# Patient Record
Sex: Male | Born: 1970 | Race: Black or African American | Hispanic: No | Smoking: Current some day smoker
Health system: Southern US, Community
[De-identification: ages and names within clinical notes are randomized; demographics above are authoritative.]

## PROBLEM LIST (undated history)

## (undated) DIAGNOSIS — F419 Anxiety disorder, unspecified: Secondary | ICD-10-CM

## (undated) DIAGNOSIS — M1990 Unspecified osteoarthritis, unspecified site: Secondary | ICD-10-CM

## (undated) DIAGNOSIS — F909 Attention-deficit hyperactivity disorder, unspecified type: Secondary | ICD-10-CM

## (undated) DIAGNOSIS — R7303 Prediabetes: Secondary | ICD-10-CM

## (undated) DIAGNOSIS — I10 Essential (primary) hypertension: Secondary | ICD-10-CM

## (undated) DIAGNOSIS — I519 Heart disease, unspecified: Secondary | ICD-10-CM

## (undated) DIAGNOSIS — E079 Disorder of thyroid, unspecified: Secondary | ICD-10-CM

## (undated) DIAGNOSIS — E785 Hyperlipidemia, unspecified: Secondary | ICD-10-CM

## (undated) DIAGNOSIS — Z973 Presence of spectacles and contact lenses: Secondary | ICD-10-CM

## (undated) DIAGNOSIS — E0590 Thyrotoxicosis, unspecified without thyrotoxic crisis or storm: Secondary | ICD-10-CM

## (undated) DIAGNOSIS — M24662 Ankylosis, left knee: Secondary | ICD-10-CM

## (undated) DIAGNOSIS — E0500 Thyrotoxicosis with diffuse goiter without thyrotoxic crisis or storm: Secondary | ICD-10-CM

## (undated) HISTORY — PX: UMBILICAL HERNIA REPAIR: SHX196

## (undated) HISTORY — PX: ANTERIOR CRUCIATE LIGAMENT REPAIR: SHX115

## (undated) HISTORY — PX: KNEE ARTHROSCOPY W/ MEDIAL COLLATERAL LIGAMENT (MCL) REPAIR: SHX1876

## (undated) HISTORY — PX: LIPOMA EXCISION: SHX5283

## (undated) HISTORY — DX: Anxiety disorder, unspecified: F41.9

## (undated) HISTORY — DX: Disorder of thyroid, unspecified: E07.9

## (undated) HISTORY — PX: HERNIA REPAIR: SHX51

## (undated) HISTORY — DX: Thyrotoxicosis with diffuse goiter without thyrotoxic crisis or storm: E05.00

## (undated) HISTORY — PX: JOINT REPLACEMENT: SHX530

## (undated) HISTORY — PX: KNEE ARTHROSCOPY W/ MENISCAL REPAIR: SHX1877

---

## 2006-05-17 DIAGNOSIS — Z8639 Personal history of other endocrine, nutritional and metabolic disease: Secondary | ICD-10-CM

## 2006-05-17 HISTORY — DX: Personal history of other endocrine, nutritional and metabolic disease: Z86.39

## 2014-08-13 DIAGNOSIS — K219 Gastro-esophageal reflux disease without esophagitis: Secondary | ICD-10-CM | POA: Insufficient documentation

## 2014-08-13 DIAGNOSIS — Z720 Tobacco use: Secondary | ICD-10-CM | POA: Insufficient documentation

## 2014-08-13 DIAGNOSIS — F17210 Nicotine dependence, cigarettes, uncomplicated: Secondary | ICD-10-CM | POA: Insufficient documentation

## 2014-08-13 DIAGNOSIS — M5126 Other intervertebral disc displacement, lumbar region: Secondary | ICD-10-CM | POA: Insufficient documentation

## 2016-03-26 DIAGNOSIS — E785 Hyperlipidemia, unspecified: Secondary | ICD-10-CM | POA: Insufficient documentation

## 2016-03-26 DIAGNOSIS — Z8249 Family history of ischemic heart disease and other diseases of the circulatory system: Secondary | ICD-10-CM | POA: Insufficient documentation

## 2018-07-24 ENCOUNTER — Ambulatory Visit (INDEPENDENT_AMBULATORY_CARE_PROVIDER_SITE_OTHER): Payer: Managed Care, Other (non HMO) | Admitting: Nurse Practitioner

## 2018-07-24 ENCOUNTER — Ambulatory Visit: Payer: Self-pay | Admitting: Nurse Practitioner

## 2018-07-24 ENCOUNTER — Encounter: Payer: Self-pay | Admitting: Nurse Practitioner

## 2018-07-24 VITALS — BP 128/78 | HR 63 | Temp 98.6°F | Ht 74.0 in | Wt 228.4 lb

## 2018-07-24 DIAGNOSIS — I10 Essential (primary) hypertension: Secondary | ICD-10-CM | POA: Diagnosis not present

## 2018-07-24 DIAGNOSIS — Z8639 Personal history of other endocrine, nutritional and metabolic disease: Secondary | ICD-10-CM | POA: Insufficient documentation

## 2018-07-24 DIAGNOSIS — F988 Other specified behavioral and emotional disorders with onset usually occurring in childhood and adolescence: Secondary | ICD-10-CM | POA: Insufficient documentation

## 2018-07-24 DIAGNOSIS — K5900 Constipation, unspecified: Secondary | ICD-10-CM

## 2018-07-24 DIAGNOSIS — R1011 Right upper quadrant pain: Secondary | ICD-10-CM | POA: Insufficient documentation

## 2018-07-24 LAB — POCT URINALYSIS DIPSTICK
Bilirubin, UA: NEGATIVE
Glucose, UA: NEGATIVE
Ketones, UA: NEGATIVE
Leukocytes, UA: NEGATIVE
Nitrite, UA: NEGATIVE
Protein, UA: NEGATIVE
Spec Grav, UA: 1.02 (ref 1.010–1.025)
Urobilinogen, UA: 0.2 E.U./dL
pH, UA: 6 (ref 5.0–8.0)

## 2018-07-24 NOTE — Progress Notes (Addendum)
Subjective:     Patient ID: Steven Gibson , male    DOB: Jan 28, 1971 , 48 y.o.   MRN: [id number]   Chief Complaint  Patient presents with  . New Patient (Initial Visit)    having stomach pain  x3 day , stomach spasms, no nausea, no vomitting, some diarrreha for the first day     HPI   Here to establish care - his girlfriend referred Shirlean Schlein).  He had been going to another PCP has to get.  Relocated from [place] went to [place].  Due for physical.  Umbilical hernia repair in 2012.  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  6 daughters and one son and 3 grandsons.    Intentional weight loss of 22 pounds.  Eats twice a day.  No correlation to food.  Drinks alcohol on the weekends, 2 drinks liquor.    History of graves disease.  Had a deep cleaning from dentist.    Swelling to left lateral neck.    His endocrinologist [facility] took him off the medication over a year ago.  He thinks Methimazole.  Had an ultrasound with iodine radiation (2000) to the thyroid. Dad has has hyperthyroid and his daughters has hypothyroid.    He has been taking Adderall for ADHD (diagnosed as an adult) - Dr. Donell Sievert [facility]) with     Abdominal Pain  This is a new problem. The current episode started in the past 7 days. The onset quality is sudden. The problem occurs daily. The most recent episode lasted 0 hours. The problem has been unchanged. The pain is located in the RUQ. The pain is at a severity of 4/10. The pain is moderate. The quality of the pain is aching and sharp. The abdominal pain does not radiate. Associated symptoms include constipation. Pertinent negatives include no diarrhea, dysuria, fever, headaches, nausea or vomiting. Nothing aggravates the pain. The pain is relieved by nothing. He has tried antacids for the symptoms. The treatment provided no relief. His past medical history is significant for abdominal surgery.  Constipation  This is a recurrent problem. The  current episode started 1 to 4 weeks ago. The problem has been gradually improving since onset. The patient is not on a high fiber diet. He does not exercise regularly. There has not been adequate water intake. Associated symptoms include abdominal pain. Pertinent negatives include no diarrhea, fever, nausea or vomiting. He has tried laxatives for the symptoms. His past medical history is significant for abdominal surgery, endocrine disease and radiation treatment (iodine radiation for hyperthyroid).  Hypertension  This is a chronic (Diagnosed over 20 years ago) problem. The current episode started more than 1 year ago. The problem is unchanged. Pertinent negatives include no anxiety, chest pain, headaches or palpitations. There are no associated agents to hypertension. Past treatments include ACE inhibitors, diuretics and calcium channel blockers. There are no compliance problems.  There is no history of angina. There is no history of chronic renal disease.     Past Medical History:  Diagnosis Date  . Graves disease      Family History  Problem Relation Age of Onset  . Hypertension Mother   . Hypertension Father   . Hypertension Brother   . Cancer Paternal Grandfather   . Hypertension Brother      Current Outpatient Medications:  .  amLODipine (NORVASC) 10 MG tablet, Take by mouth., Disp: , Rfl:  .  amphetamine-dextroamphetamine (ADDERALL) 10 MG tablet, , Disp: ,  Rfl:  .  aspirin EC 81 MG tablet, Take by mouth., Disp: , Rfl:  .  lisinopril-hydrochlorothiazide (PRINZIDE,ZESTORETIC) 20-25 MG tablet, , Disp: , Rfl:  .  pravastatin (PRAVACHOL) 20 MG tablet, Take by mouth., Disp: , Rfl:    Not on File   Review of Systems  Constitutional: Negative for fatigue and fever.  HENT: Negative for sore throat.   Respiratory: Negative for cough and wheezing.   Cardiovascular: Negative for chest pain, palpitations and leg swelling.  Gastrointestinal: Positive for abdominal pain and constipation.  Negative for abdominal distention, blood in stool, diarrhea, nausea and vomiting.  Endocrine: Negative for polydipsia, polyphagia and polyuria.  Genitourinary: Negative for dysuria.  Skin: Negative.  Negative for color change, pallor and rash.  Neurological: Negative for dizziness, weakness and headaches.  Hematological: Positive for adenopathy.     Today's Vitals   07/24/18 1023  BP: 128/78  Pulse: 63  Temp: 98.6 F (37 C)  TempSrc: Oral  SpO2: 95%  Weight: 228 lb 6.4 oz (103.6 kg)  Height: 6\' 2"  (1.88 m)   Body mass index is 29.32 kg/m.   Objective:  Physical Exam Constitutional:      Appearance: Normal appearance.  Cardiovascular:     Rate and Rhythm: Normal rate and regular rhythm.     Pulses: Normal pulses.     Heart sounds: Normal heart sounds. No murmur.  Pulmonary:     Effort: Pulmonary effort is normal. No respiratory distress.     Breath sounds: Normal breath sounds.  Abdominal:     General: Abdomen is flat. Bowel sounds are normal. There is no distension.     Palpations: Abdomen is soft. There is no mass.     Tenderness: There is abdominal tenderness (right upper quadrant). There is no right CVA tenderness or left CVA tenderness. Positive signs include Murphy's sign. Negative signs include McBurney's sign.     Hernia: No hernia is present.  Neurological:     Mental Status: He is alert.         Assessment And Plan:     1. Right upper quadrant abdominal pain  Positive Murphy's  Will check amylase and lipase - CBC no Diff - CMP14 + Anion Gap - Amylase - Lipase - POCT Urinalysis Dipstick (81002)  2. Essential hypertension . B/P is good control  . CMP ordered to check renal function.  . The importance of regular exercise and dietary modification was stressed to the patient.  . Stressed importance of losing ten percent of her body weight to help with B/P control.  . The weight loss would help with decreasing cardiac and cancer risk as well.  - CBC no  Diff - CMP14 + Anion Gap  3. H/O Graves' disease  No current medications he reports he is stable. - TSH - T3 - T4, Free  4. Attention deficit disorder, unspecified hyperactivity presence  Being managed by behavioral health   5. Constipation, unspecified constipation type  Reports previous history but feels his previous abdominal pain was related to this.  Took a stool softner and laxative with relief.  Encouraged to stay well hydrated and increase fiber intake    [name], FNP

## 2018-07-24 NOTE — Patient Instructions (Signed)

## 2018-07-25 LAB — CMP14 + ANION GAP
ALT: 23 IU/L (ref 0–44)
AST: 27 IU/L (ref 0–40)
Albumin/Globulin Ratio: 1.6 (ref 1.2–2.2)
Albumin: 4.4 g/dL (ref 4.0–5.0)
Alkaline Phosphatase: 125 IU/L — ABNORMAL HIGH (ref 39–117)
Anion Gap: 19 mmol/L — ABNORMAL HIGH (ref 10.0–18.0)
BUN/Creatinine Ratio: 10 (ref 9–20)
BUN: 10 mg/dL (ref 6–24)
Bilirubin Total: 0.3 mg/dL (ref 0.0–1.2)
CO2: 27 mmol/L (ref 20–29)
Calcium: 9.8 mg/dL (ref 8.7–10.2)
Chloride: 97 mmol/L (ref 96–106)
Creatinine, Ser: 1.01 mg/dL (ref 0.76–1.27)
GFR calc Af Amer: 101 mL/min/{1.73_m2} (ref >59)
GFR calc non Af Amer: 88 mL/min/{1.73_m2} (ref >59)
Globulin, Total: 2.7 g/dL (ref 1.5–4.5)
Glucose: 86 mg/dL (ref 65–99)
Potassium: 4.5 mmol/L (ref 3.5–5.2)
Sodium: 143 mmol/L (ref 134–144)
Total Protein: 7.1 g/dL (ref 6.0–8.5)

## 2018-07-25 LAB — CBC
Hematocrit: 47.1 % (ref 37.5–51.0)
Hemoglobin: 15.5 g/dL (ref 13.0–17.7)
MCH: 27.1 pg (ref 26.6–33.0)
MCHC: 32.9 g/dL (ref 31.5–35.7)
MCV: 82 fL (ref 79–97)
Platelets: 367 10*3/uL (ref 150–450)
RBC: 5.73 x10E6/uL (ref 4.14–5.80)
RDW: 13.7 % (ref 11.6–15.4)
WBC: 6.5 10*3/uL (ref 3.4–10.8)

## 2018-07-25 LAB — T4, FREE: Free T4: 1.31 ng/dL (ref 0.82–1.77)

## 2018-07-25 LAB — LIPASE: Lipase: 19 U/L (ref 13–78)

## 2018-07-25 LAB — T3: T3, Total: 117 ng/dL (ref 71–180)

## 2018-07-25 LAB — TSH: TSH: 1.72 u[IU]/mL (ref 0.450–4.500)

## 2018-07-25 LAB — AMYLASE: Amylase: 96 U/L (ref 31–110)

## 2018-08-02 ENCOUNTER — Other Ambulatory Visit: Payer: Self-pay

## 2018-08-02 ENCOUNTER — Ambulatory Visit (INDEPENDENT_AMBULATORY_CARE_PROVIDER_SITE_OTHER): Payer: Managed Care, Other (non HMO) | Admitting: Nurse Practitioner

## 2018-08-02 ENCOUNTER — Encounter: Payer: Self-pay | Admitting: Nurse Practitioner

## 2018-08-02 VITALS — BP 112/84 | HR 83 | Temp 97.5°F | Ht 71.75 in | Wt 227.8 lb

## 2018-08-02 DIAGNOSIS — I10 Essential (primary) hypertension: Secondary | ICD-10-CM

## 2018-08-02 DIAGNOSIS — Z125 Encounter for screening for malignant neoplasm of prostate: Secondary | ICD-10-CM | POA: Diagnosis not present

## 2018-08-02 DIAGNOSIS — Z0000 Encounter for general adult medical examination without abnormal findings: Secondary | ICD-10-CM

## 2018-08-02 DIAGNOSIS — E782 Mixed hyperlipidemia: Secondary | ICD-10-CM | POA: Insufficient documentation

## 2018-08-02 DIAGNOSIS — R202 Paresthesia of skin: Secondary | ICD-10-CM | POA: Insufficient documentation

## 2018-08-02 DIAGNOSIS — Z139 Encounter for screening, unspecified: Secondary | ICD-10-CM

## 2018-08-02 DIAGNOSIS — R200 Anesthesia of skin: Secondary | ICD-10-CM

## 2018-08-02 LAB — POCT URINALYSIS DIPSTICK
Bilirubin, UA: NEGATIVE
Glucose, UA: NEGATIVE
Ketones, UA: NEGATIVE
Leukocytes, UA: NEGATIVE
Nitrite, UA: NEGATIVE
Protein, UA: NEGATIVE
Spec Grav, UA: 1.025 (ref 1.010–1.025)
Urobilinogen, UA: 0.2 E.U./dL
pH, UA: 6 (ref 5.0–8.0)

## 2018-08-02 LAB — POCT UA - MICROALBUMIN
Albumin/Creatinine Ratio, Urine, POC: <30
Creatinine, POC: 300 mg/dL
Microalbumin Ur, POC: 30 mg/L

## 2018-08-02 MED ORDER — AMLODIPINE BESYLATE 10 MG PO TABS: 10.0000 mg | ORAL_TABLET | Freq: Every day | ORAL | 1 refills | Status: DC

## 2018-08-02 MED ORDER — PRAVASTATIN SODIUM 20 MG PO TABS: 20.0000 mg | ORAL_TABLET | Freq: Every day | ORAL | 1 refills | Status: DC

## 2018-08-02 MED ORDER — LISINOPRIL-HYDROCHLOROTHIAZIDE 20-25 MG PO TABS: 1.0000 | ORAL_TABLET | Freq: Every day | ORAL | 1 refills | Status: DC

## 2018-08-02 NOTE — Patient Instructions (Signed)
Health Maintenance, Male A healthy lifestyle and preventive care is important for your health and wellness. Ask your health care provider about what schedule of regular examinations is right for you. What should I know about weight and diet? Eat a Healthy Diet  Eat plenty of vegetables, fruits, whole grains, low-fat dairy products, and lean protein.  Do not eat a lot of foods high in solid fats, added sugars, or salt.  Maintain a Healthy Weight Regular exercise can help you achieve or maintain a healthy weight. You should:  Do at least 150 minutes of exercise each week. The exercise should increase your heart rate and make you sweat (moderate-intensity exercise).  Do strength-training exercises at least twice a week. Watch Your Levels of Cholesterol and Blood Lipids  Have your blood tested for lipids and cholesterol every 5 years starting at 49 years of age. If you are at high risk for heart disease, you should start having your blood tested when you are 49 years old. You may need to have your cholesterol levels checked more often if: ? Your lipid or cholesterol levels are high. ? You are older than 50 years of age. ? You are at high risk for heart disease. What should I know about cancer screening? Many types of cancers can be detected early and may often be prevented. Lung Cancer  You should be screened every year for lung cancer if: ? You are a current smoker who has smoked for at least 30 years. ? You are a former smoker who has quit within the past 15 years.  Talk to your health care provider about your screening options, when you should start screening, and how often you should be screened. Colorectal Cancer  Routine colorectal cancer screening usually begins at 50 years of age and should be repeated every 5-10 years until you are 49 years old. You may need to be screened more often if early forms of precancerous polyps or small growths are found. Your health care provider may  recommend screening at an earlier age if you have risk factors for colon cancer.  Your health care provider may recommend using home test kits to check for hidden blood in the stool.  A small camera at the end of a tube can be used to examine your colon (sigmoidoscopy or colonoscopy). This checks for the earliest forms of colorectal cancer. Prostate and Testicular Cancer  Depending on your age and overall health, your health care provider may do certain tests to screen for prostate and testicular cancer.  Talk to your health care provider about any symptoms or concerns you have about testicular or prostate cancer. Skin Cancer  Check your skin from head to toe regularly.  Tell your health care provider about any new moles or changes in moles, especially if: ? There is a change in a mole's size, shape, or color. ? You have a mole that is larger than a pencil eraser.  Always use sunscreen. Apply sunscreen liberally and repeat throughout the day.  Protect yourself by wearing long sleeves, pants, a wide-brimmed hat, and sunglasses when outside. What should I know about heart disease, diabetes, and high blood pressure?  If you are 18-39 years of age, have your blood pressure checked every 3-5 years. If you are 40 years of age or older, have your blood pressure checked every year. You should have your blood pressure measured twice-once when you are at a hospital or clinic, and once when you are not at a hospital   or clinic. Record the average of the two measurements. To check your blood pressure when you are not at a hospital or clinic, you can use: ? An automated blood pressure machine at a pharmacy. ? A home blood pressure monitor.  Talk to your health care provider about your target blood pressure.  If you are between 51-110 years old, ask your health care provider if you should take aspirin to prevent heart disease.  Have regular diabetes screenings by checking your fasting blood sugar  level. ? If you are at a normal weight and have a low risk for diabetes, have this test once every three years after the age of 59. ? If you are overweight and have a high risk for diabetes, consider being tested at a younger age or more often.  A one-time screening for abdominal aortic aneurysm (AAA) by ultrasound is recommended for men aged 65-75 years who are current or former smokers. What should I know about preventing infection? Hepatitis B If you have a higher risk for hepatitis B, you should be screened for this virus. Talk with your health care provider to find out if you are at risk for hepatitis B infection. Hepatitis C Blood testing is recommended for:  Everyone born from [birth year] through 1966.  Anyone with known risk factors for hepatitis C. Sexually Transmitted Diseases (STDs)  You should be screened each year for STDs including gonorrhea and chlamydia if: ? You are sexually active and are younger than 49 years of age. ? You are older than 49 years of age and your health care provider tells you that you are at risk for this type of infection. ? Your sexual activity has changed since you were last screened and you are at an increased risk for chlamydia or gonorrhea. Ask your health care provider if you are at risk.  Talk with your health care provider about whether you are at high risk of being infected with HIV. Your health care provider may recommend a prescription medicine to help prevent HIV infection. What else can I do?  Schedule regular health, dental, and eye exams.  Stay current with your vaccines (immunizations).  Do not use any tobacco products, such as cigarettes, chewing tobacco, and e-cigarettes. If you need help quitting, ask your health care provider.  Limit alcohol intake to no more than 2 drinks per day. One drink equals 12 ounces of beer, 5 ounces of wine, or 1 ounces of hard liquor.  Do not use street drugs.  Do not share needles.  Ask your health  care provider for help if you need support or information about quitting drugs.  Tell your health care provider if you often feel depressed.  Tell your health care provider if you have ever been abused or do not feel safe at home. This information is not intended to replace advice given to you by your health care provider. Make sure you discuss any questions you have with your health care provider. Document Released: 10/30/2007 Document Revised: 12/31/2015 Document Reviewed: 02/04/2015 Elsevier Interactive Patient Education  2019 Elsevier Inc.    Increase water intake make sure to drink a glass of water a day  Encouraged to stop drinking energy drinks  Magnesium 200mg  with evening meal  Encouraged to quit smoking this can cause heart disease and poor circulation  Continue with vitamin

## 2018-08-02 NOTE — Progress Notes (Signed)
Subjective:     Patient ID: Steven Gibson , male    DOB: 11/25/1970 , 48 y.o.   MRN: [id number]   Chief Complaint  Patient presents with  . Annual Exam   Men's preventive visit. Patient Health Questionnaire (PHQ-2) is     Office Visit from 07/24/2018 in [name]  PHQ-2 Total Score  3     Patient is on a regular diet other than no red meat. Marital status: Single. Relevant history for alcohol use is:   Social History   Substance and Sexual Activity  Alcohol Use Yes   Relevant history for tobacco use is:  Social History   Tobacco Use  Smoking Status Current Some Day Smoker  . Packs/day: 0.25  . Years: 20.00  . Pack years: 5.00  . Types: Cigarettes  Smokeless Tobacco Former Neurosurgeon   Exercised daily about 30 minutes.    HPI  Here for HM.      Past Medical History:  Diagnosis Date  . Graves disease      Family History  Problem Relation Age of Onset  . Hypertension Mother   . Hypertension Father   . Hypertension Brother   . Cancer Paternal Grandfather   . Hypertension Brother      Current Outpatient Medications:  .  amLODipine (NORVASC) 10 MG tablet, Take by mouth., Disp: , Rfl:  .  amphetamine-dextroamphetamine (ADDERALL) 15 MG tablet, , Disp: , Rfl:  .  aspirin EC 81 MG tablet, Take by mouth., Disp: , Rfl:  .  escitalopram (LEXAPRO) 20 MG tablet, , Disp: , Rfl:  .  lisinopril-hydrochlorothiazide (PRINZIDE,ZESTORETIC) 20-25 MG tablet, , Disp: , Rfl:  .  pravastatin (PRAVACHOL) 20 MG tablet, Take by mouth., Disp: , Rfl:    No Known Allergies   Review of Systems  Constitutional: Negative.   HENT: Negative.   Eyes: Negative.   Respiratory: Negative.   Cardiovascular: Negative.   Gastrointestinal: Negative.   Endocrine: Negative.   Genitourinary: Negative.   Musculoskeletal: Negative.   Allergic/Immunologic: Negative.   Neurological: Negative for dizziness and headaches.  Hematological: Negative.    Psychiatric/Behavioral: Negative.      Today's Vitals   08/02/18 0900  BP: 112/84  Pulse: 83  Temp: (!) 97.5 F (36.4 C)  TempSrc: Oral  Weight: 227 lb 12.8 oz (103.3 kg)  Height: 5' 11.75" (1.822 m)   Body mass index is 31.11 kg/m.   Objective:  Physical Exam Vitals signs reviewed.  Constitutional:      Appearance: Normal appearance. He is obese. He is not ill-appearing.  HENT:     Head: Normocephalic and atraumatic.     Right Ear: Tympanic membrane, ear canal and external ear normal. There is no impacted cerumen.     Left Ear: Tympanic membrane, ear canal and external ear normal. There is no impacted cerumen.     Nose: Nose normal. No congestion.     Mouth/Throat:     Mouth: Mucous membranes are moist.  Eyes:     Extraocular Movements: Extraocular movements intact.     Conjunctiva/sclera: Conjunctivae normal.     Pupils: Pupils are equal, round, and reactive to light.  Neck:     Musculoskeletal: Normal range of motion and neck supple. No neck rigidity.  Cardiovascular:     Rate and Rhythm: Normal rate and regular rhythm.     Pulses: Normal pulses.     Heart sounds: Normal heart sounds. No murmur.  Pulmonary:  Effort: Pulmonary effort is normal.     Breath sounds: Normal breath sounds.  Abdominal:     General: Abdomen is flat. Bowel sounds are normal.     Palpations: Abdomen is soft.  Genitourinary:    Penis: Normal.   Musculoskeletal: Normal range of motion.        General: No tenderness.  Skin:    General: Skin is warm and dry.     Capillary Refill: Capillary refill takes less than 2 seconds.  Neurological:     General: No focal deficit present.     Mental Status: He is alert and oriented to person, place, and time.  Psychiatric:        Mood and Affect: Mood normal.        Behavior: Behavior normal.        Thought Content: Thought content normal.        Judgment: Judgment normal.         Assessment And Plan:     1. Health maintenance  examination . Behavior modifications discussed and diet history reviewed.   . Pt will continue to exercise regularly and modify diet with low GI, plant based foods and decrease intake of processed foods.  . Recommend intake of daily multivitamin, Vitamin D, and calcium.  . Recommend preventive screenings, as well as recommend immunizations that include influenza, TDAP(will provide [place] with a copy of his most recent tetanus)  2. Encounter for prostate cancer screening  - PSA  3. Essential hypertension . B/P is controlled.  . CMP ordered to check renal function.  . The importance of regular exercise and dietary modification was stressed to the patient.  . Stressed importance of losing ten percent of her body weight to help with B/P control.  . The weight loss would help with decreasing cardiac and cancer risk as well.  - amLODipine (NORVASC) 10 MG tablet; Take 1 tablet (10 mg total) by mouth daily.  Dispense: 90 tablet; Refill: 1 - lisinopril-hydrochlorothiazide (PRINZIDE,ZESTORETIC) 20-25 MG tablet; Take 1 tablet by mouth daily.  Dispense: 90 tablet; Refill: 1  4. Mixed hyperlipidemia  Chronic, controlled  Continue with current medications - pravastatin (PRAVACHOL) 20 MG tablet; Take 1 tablet (20 mg total) by mouth daily.  Dispense: 90 tablet; Refill: 1  5. 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  Will check for metabolic causes. - Vitamin B12 - Methylmalonic Acid - Magnesium   [name], FNP

## 2018-08-04 LAB — METHYLMALONIC ACID, SERUM: Methylmalonic Acid: 133 nmol/L (ref 0–378)

## 2018-08-04 LAB — VITAMIN B12: Vitamin B-12: 1029 pg/mL (ref 232–1245)

## 2018-08-04 LAB — HIV ANTIBODY (ROUTINE TESTING W REFLEX): HIV Screen 4th Generation wRfx: NONREACTIVE

## 2018-08-04 LAB — MAGNESIUM: Magnesium: 2.1 mg/dL (ref 1.6–2.3)

## 2018-08-04 LAB — PSA: Prostate Specific Ag, Serum: 0.9 ng/mL (ref 0.0–4.0)

## 2018-08-07 ENCOUNTER — Encounter: Payer: Self-pay | Admitting: Nurse Practitioner

## 2018-08-10 ENCOUNTER — Encounter: Payer: Managed Care, Other (non HMO) | Admitting: Nurse Practitioner

## 2018-08-14 ENCOUNTER — Encounter: Payer: Self-pay | Admitting: Nurse Practitioner

## 2018-08-22 ENCOUNTER — Encounter: Payer: Self-pay | Admitting: Nurse Practitioner

## 2018-08-30 ENCOUNTER — Ambulatory Visit: Payer: Managed Care, Other (non HMO) | Admitting: Nurse Practitioner

## 2018-08-30 ENCOUNTER — Encounter: Payer: Self-pay | Admitting: Nurse Practitioner

## 2018-12-06 ENCOUNTER — Ambulatory Visit: Payer: Managed Care, Other (non HMO) | Admitting: Nurse Practitioner

## 2018-12-13 ENCOUNTER — Other Ambulatory Visit: Payer: Self-pay

## 2018-12-14 ENCOUNTER — Ambulatory Visit (INDEPENDENT_AMBULATORY_CARE_PROVIDER_SITE_OTHER): Payer: Managed Care, Other (non HMO) | Admitting: Nurse Practitioner

## 2018-12-14 ENCOUNTER — Encounter: Payer: Self-pay | Admitting: Nurse Practitioner

## 2018-12-14 VITALS — BP 128/88 | HR 88 | Temp 98.2°F | Ht 72.8 in | Wt 229.0 lb

## 2018-12-14 DIAGNOSIS — E782 Mixed hyperlipidemia: Secondary | ICD-10-CM

## 2018-12-14 DIAGNOSIS — I10 Essential (primary) hypertension: Secondary | ICD-10-CM

## 2018-12-14 DIAGNOSIS — Z720 Tobacco use: Secondary | ICD-10-CM

## 2018-12-14 DIAGNOSIS — F988 Other specified behavioral and emotional disorders with onset usually occurring in childhood and adolescence: Secondary | ICD-10-CM | POA: Diagnosis not present

## 2018-12-14 NOTE — Progress Notes (Addendum)
Subjective:     Patient ID: Steven Gibson , male    DOB: [birth year] , 48 y.o.   MRN: [id number]   Chief Complaint  Patient presents with  . Hypertension    HPI  Hypertension This is a chronic problem. The current episode started more than 1 year ago. The problem is unchanged. The problem is controlled. Pertinent negatives include no anxiety, blurred vision, chest pain, headaches, malaise/fatigue, palpitations or peripheral edema. There are no associated agents to hypertension. Risk factors for coronary artery disease include sedentary lifestyle, male gender and obesity. Past treatments include diuretics. There are no compliance problems.  There is no history of chronic renal disease.     Past Medical History:  Diagnosis Date  . Graves disease      Family History  Problem Relation Age of Onset  . Hypertension Mother   . Hypertension Father   . Hypertension Brother   . Cancer Paternal Grandfather   . Hypertension Brother      Current Outpatient Medications:  .  amLODipine (NORVASC) 10 MG tablet, Take 1 tablet (10 mg total) by mouth daily., Disp: 90 tablet, Rfl: 1 .  amphetamine-dextroamphetamine (ADDERALL) 15 MG tablet, , Disp: , Rfl:  .  aspirin EC 81 MG tablet, Take by mouth., Disp: , Rfl:  .  escitalopram (LEXAPRO) 20 MG tablet, , Disp: , Rfl:  .  lisinopril-hydrochlorothiazide (PRINZIDE,ZESTORETIC) 20-25 MG tablet, Take 1 tablet by mouth daily., Disp: 90 tablet, Rfl: 1 .  pravastatin (PRAVACHOL) 20 MG tablet, Take 1 tablet (20 mg total) by mouth daily., Disp: 90 tablet, Rfl: 1   No Known Allergies   Review of Systems  Constitutional: Negative.  Negative for fatigue and malaise/fatigue.  Eyes: Negative for blurred vision.  Respiratory: Negative.   Cardiovascular: Negative.  Negative for chest pain, palpitations and leg swelling.  Endocrine: Negative for polydipsia, polyphagia and polyuria.  Neurological: Negative for dizziness and headaches.     Today's  Vitals   12/14/18 0933  BP: 128/88  Pulse: 88  Temp: 98.2 F (36.8 C)  TempSrc: Oral  Weight: 229 lb (103.9 kg)  Height: 6' 0.8" (1.849 m)  PainSc: 0-No pain   Body mass index is 30.38 kg/m.   Objective:  Physical Exam Vitals signs reviewed.  Constitutional:      Appearance: Normal appearance. He is obese. He is not ill-appearing.  HENT:     Right Ear: There is no impacted cerumen.     Left Ear: There is no impacted cerumen.     Nose: No congestion.     Mouth/Throat:     Mouth: Mucous membranes are moist.  Eyes:     Extraocular Movements: Extraocular movements intact.     Conjunctiva/sclera: Conjunctivae normal.     Pupils: Pupils are equal, round, and reactive to light.  Neck:     Musculoskeletal: No neck rigidity.  Cardiovascular:     Rate and Rhythm: Normal rate and regular rhythm.     Pulses: Normal pulses.     Heart sounds: Normal heart sounds. No murmur.  Pulmonary:     Effort: Pulmonary effort is normal.     Breath sounds: Normal breath sounds.  Abdominal:     General: Abdomen is flat. Bowel sounds are normal.     Palpations: Abdomen is soft.  Musculoskeletal:        General: No tenderness.  Skin:    General: Skin is warm and dry.     Capillary Refill: Capillary refill takes  less than 2 seconds.  Neurological:     General: No focal deficit present.     Mental Status: He is alert and oriented to person, place, and time.  Psychiatric:        Mood and Affect: Mood normal.        Behavior: Behavior normal.        Thought Content: Thought content normal.        Judgment: Judgment normal.         Assessment And Plan:     1. Essential hypertension . B/P is controlled.  . CMP ordered to check renal function.  . The importance of regular exercise and dietary modification was stressed to the patient.  . Stressed importance of losing ten percent of her body weight to help with B/P control.  . The weight loss would help with decreasing cardiac and cancer  risk as well.   2. Mixed hyperlipidemia  Chronic, controlled  Continue with current medications  Tolerating medications well  3. Attention deficit disorder, unspecified hyperactivity presence  Chronic  Continue with your follow up with Behavioral health  4. Tobacco abuse  He is not interested in quitting at this time  He is advised of the risks of smoking to include heart disease (stroke, Heart attack) and lung disease   Minette Brine, FNP    THE PATIENT IS ENCOURAGED TO PRACTICE SOCIAL DISTANCING DUE TO THE COVID-19 PANDEMIC.

## 2019-01-12 ENCOUNTER — Encounter: Payer: Self-pay | Admitting: Nurse Practitioner

## 2019-01-12 ENCOUNTER — Telehealth: Payer: Self-pay

## 2019-01-12 NOTE — Telephone Encounter (Signed)
Left voicemail for pt to return call. Pt new pt appt couldn't be a physical because he had issues his appt for 3/18 was a physical

## 2019-01-28 ENCOUNTER — Other Ambulatory Visit: Payer: Self-pay | Admitting: Nurse Practitioner

## 2019-01-28 DIAGNOSIS — E782 Mixed hyperlipidemia: Secondary | ICD-10-CM

## 2019-03-05 ENCOUNTER — Other Ambulatory Visit: Payer: Self-pay

## 2019-03-05 ENCOUNTER — Ambulatory Visit (INDEPENDENT_AMBULATORY_CARE_PROVIDER_SITE_OTHER): Payer: Managed Care, Other (non HMO) | Admitting: Nurse Practitioner

## 2019-03-05 ENCOUNTER — Encounter: Payer: Self-pay | Admitting: Nurse Practitioner

## 2019-03-05 VITALS — BP 138/84 | HR 94 | Temp 98.5°F | Ht 72.8 in | Wt 238.6 lb

## 2019-03-05 DIAGNOSIS — R5383 Other fatigue: Secondary | ICD-10-CM | POA: Diagnosis not present

## 2019-03-05 DIAGNOSIS — F988 Other specified behavioral and emotional disorders with onset usually occurring in childhood and adolescence: Secondary | ICD-10-CM

## 2019-03-05 MED ORDER — AMPHETAMINE-DEXTROAMPHETAMINE 15 MG PO TABS: 15.0000 mg | ORAL_TABLET | Freq: Four times a day (QID) | ORAL | 0 refills | Status: DC

## 2019-03-05 NOTE — Progress Notes (Signed)
Subjective:     Patient ID: Steven Gibson , male    DOB: [birth year] , 48 y.o.   MRN: [id number]   Chief Complaint  Patient presents with  . Anxiety    HPI  Here today due to feeling sluggish, no energy.  Since the last 3 - 4 days.  He feels it is related to his medications.  They are not doing office visits - the psychiatrist does not feel it is the medication he prescribed but if needed to back off of   He had been off the adderral for 3-4 days and began having anxiety.  He has just started back on adderall yesterday.  He slept for 13 hours yesterday.   Denies shortness of breath or cough.     Had a sleep study done 10 years ago and was told that he did not have sleep apnea while in [place]  He had been to [facility] to the Endocrinologist  He sees Dr. Patriciaann Clan - psychiatry   He takes to complete his paper work.    Anxiety Patient reports no chest pain, dizziness or palpitations.      Past Medical History:  Diagnosis Date  . Graves disease      Family History  Problem Relation Age of Onset  . Hypertension Mother   . Hypertension Father   . Hypertension Brother   . Cancer Paternal Grandfather   . Hypertension Brother      Current Outpatient Medications:  .  amLODipine (NORVASC) 10 MG tablet, Take 1 tablet (10 mg total) by mouth daily., Disp: 90 tablet, Rfl: 1 .  amphetamine-dextroamphetamine (ADDERALL) 15 MG tablet, , Disp: , Rfl:  .  aspirin EC 81 MG tablet, Take by mouth., Disp: , Rfl:  .  escitalopram (LEXAPRO) 20 MG tablet, , Disp: , Rfl:  .  lisinopril-hydrochlorothiazide (PRINZIDE,ZESTORETIC) 20-25 MG tablet, Take 1 tablet by mouth daily., Disp: 90 tablet, Rfl: 1 .  pravastatin (PRAVACHOL) 20 MG tablet, TAKE ONE TABLET BY MOUTH DAILY, Disp: 90 tablet, Rfl: 0   No Known Allergies   Review of Systems  Constitutional: Negative.   Respiratory: Negative.   Cardiovascular: Negative.  Negative for chest pain, palpitations and leg swelling.   Neurological: Negative for dizziness and headaches.  Psychiatric/Behavioral: Negative.      Today's Vitals   03/05/19 1507  BP: 138/84  Pulse: 94  Temp: 98.5 F (36.9 C)  TempSrc: Oral  Weight: 238 lb 9.6 oz (108.2 kg)  Height: 6' 0.8" (1.849 m)   Body mass index is 31.65 kg/m.   Objective:  Physical Exam Constitutional:      Appearance: Normal appearance.  Cardiovascular:     Rate and Rhythm: Normal rate and regular rhythm.     Pulses: Normal pulses.     Heart sounds: Normal heart sounds. No murmur.  Pulmonary:     Effort: Pulmonary effort is normal.     Breath sounds: Normal breath sounds.  Skin:    General: Skin is warm.     Capillary Refill: Capillary refill takes less than 2 seconds.  Neurological:     General: No focal deficit present.     Mental Status: He is alert and oriented to person, place, and time.  Psychiatric:        Mood and Affect: Mood normal.        Behavior: Behavior normal.        Thought Content: Thought content normal.  Judgment: Judgment normal.         Assessment And Plan:     1. Fatigue, unspecified type  Sudden onset of fatigue in the last 3-4 days, he did have a 3-4 day time frame he did not take his adderall.   I will check metabolic levels  I also checked a coronavirus test due to the sudden onset of fatigue - BMP8+eGFR - CBC with Differential/Platelet - T4 - T3, free - TSH - Vitamin B12  2. Attention deficit disorder, unspecified hyperactivity presence  He would like to have his medications refilled here at the office.  I explained to him if he is controlled and once we receive his records from his most recent office visits with his psychiatrist for reviewl - amphetamine-dextroamphetamine (ADDERALL) 15 MG tablet; Take 1 tablet by mouth 4 (four) times daily.  Dispense: 120 tablet; Refill: 0   Minette Brine, FNP    THE PATIENT IS ENCOURAGED TO PRACTICE SOCIAL DISTANCING DUE TO THE COVID-19 PANDEMIC.

## 2019-03-06 ENCOUNTER — Telehealth: Payer: Self-pay

## 2019-03-06 LAB — BMP8+EGFR
BUN/Creatinine Ratio: 15 (ref 9–20)
BUN: 13 mg/dL (ref 6–24)
CO2: 24 mmol/L (ref 20–29)
Calcium: 9.8 mg/dL (ref 8.7–10.2)
Chloride: 104 mmol/L (ref 96–106)
Creatinine, Ser: 0.85 mg/dL (ref 0.76–1.27)
GFR calc Af Amer: 119 mL/min/{1.73_m2} (ref >59)
GFR calc non Af Amer: 103 mL/min/{1.73_m2} (ref >59)
Glucose: 97 mg/dL (ref 65–99)
Potassium: 3.8 mmol/L (ref 3.5–5.2)
Sodium: 144 mmol/L (ref 134–144)

## 2019-03-06 LAB — CBC WITH DIFFERENTIAL/PLATELET
Basophils Absolute: 0.1 10*3/uL (ref 0.0–0.2)
Basos: 1 %
EOS (ABSOLUTE): 0.1 10*3/uL (ref 0.0–0.4)
Eos: 2 %
Hematocrit: 45 % (ref 37.5–51.0)
Hemoglobin: 15 g/dL (ref 13.0–17.7)
Immature Grans (Abs): 0.1 10*3/uL (ref 0.0–0.1)
Immature Granulocytes: 1 %
Lymphocytes Absolute: 1.7 10*3/uL (ref 0.7–3.1)
Lymphs: 27 %
MCH: 28.2 pg (ref 26.6–33.0)
MCHC: 33.3 g/dL (ref 31.5–35.7)
MCV: 85 fL (ref 79–97)
Monocytes Absolute: 0.5 10*3/uL (ref 0.1–0.9)
Monocytes: 8 %
Neutrophils Absolute: 3.7 10*3/uL (ref 1.4–7.0)
Neutrophils: 61 %
Platelets: 295 10*3/uL (ref 150–450)
RBC: 5.31 x10E6/uL (ref 4.14–5.80)
RDW: 14.3 % (ref 11.6–15.4)
WBC: 6.1 10*3/uL (ref 3.4–10.8)

## 2019-03-06 LAB — T4: T4, Total: 8.7 ug/dL (ref 4.5–12.0)

## 2019-03-06 LAB — T3, FREE: T3, Free: 3.1 pg/mL (ref 2.0–4.4)

## 2019-03-06 LAB — TSH: TSH: 1.18 u[IU]/mL (ref 0.450–4.500)

## 2019-03-06 LAB — VITAMIN B12: Vitamin B-12: 951 pg/mL (ref 232–1245)

## 2019-03-06 NOTE — Telephone Encounter (Signed)
LVM for pt to call the office to receive message from provider   Minette Brine, FNP  Candiss Norse T, CMA        Kidney functions are normal. Thyroid levels are normal and vitamin b12 is excellent. Blood levels are normal. We are still waiting for your coronavirus test.

## 2019-03-07 ENCOUNTER — Telehealth: Payer: Self-pay

## 2019-03-07 LAB — NOVEL CORONAVIRUS, NAA: SARS-CoV-2, NAA: NOT DETECTED

## 2019-03-07 NOTE — Telephone Encounter (Signed)
I am waiting for his office notes from his behavioral health provider. Can you find out the exact time he had a refill?

## 2019-03-07 NOTE — Telephone Encounter (Signed)
Gave pt his lab results. He wants to know if you can start writing his prescriptions for his addrerall and lexapro? He stated he would rather have you do that and if there is anything he needs to do or sign he will

## 2019-03-08 ENCOUNTER — Telehealth: Payer: Self-pay

## 2019-03-08 NOTE — Telephone Encounter (Signed)
LVM for pt to call the office to receive message from provider  Minette Brine, FNP  Hassell Done, CMA  Caller: Unspecified (Yesterday, 11:02 AM)        I am waiting for his office notes from his behavioral health provider. Can you find out the exact time he had a refill?

## 2019-03-22 ENCOUNTER — Ambulatory Visit: Payer: Managed Care, Other (non HMO) | Admitting: Nurse Practitioner

## 2019-03-26 ENCOUNTER — Other Ambulatory Visit: Payer: Self-pay

## 2019-03-26 ENCOUNTER — Encounter: Payer: Self-pay | Admitting: Nurse Practitioner

## 2019-04-02 ENCOUNTER — Other Ambulatory Visit: Payer: Self-pay | Admitting: Nurse Practitioner

## 2019-04-02 ENCOUNTER — Encounter: Payer: Self-pay | Admitting: Nurse Practitioner

## 2019-04-02 DIAGNOSIS — F988 Other specified behavioral and emotional disorders with onset usually occurring in childhood and adolescence: Secondary | ICD-10-CM

## 2019-04-02 MED ORDER — AMPHETAMINE-DEXTROAMPHETAMINE 15 MG PO TABS: 15.0000 mg | ORAL_TABLET | Freq: Four times a day (QID) | ORAL | 0 refills | Status: DC

## 2019-04-02 NOTE — Telephone Encounter (Signed)
Please refill patient's prescription. He has an appointment on the 30th

## 2019-04-03 ENCOUNTER — Other Ambulatory Visit: Payer: Self-pay | Admitting: Nurse Practitioner

## 2019-04-03 ENCOUNTER — Encounter: Payer: Self-pay | Admitting: Nurse Practitioner

## 2019-04-03 DIAGNOSIS — F988 Other specified behavioral and emotional disorders with onset usually occurring in childhood and adolescence: Secondary | ICD-10-CM

## 2019-04-03 MED ORDER — AMPHETAMINE-DEXTROAMPHETAMINE 15 MG PO TABS: 15.0000 mg | ORAL_TABLET | Freq: Four times a day (QID) | ORAL | 0 refills | Status: DC

## 2019-04-16 ENCOUNTER — Ambulatory Visit: Payer: Managed Care, Other (non HMO) | Admitting: Nurse Practitioner

## 2019-04-17 ENCOUNTER — Other Ambulatory Visit: Payer: Self-pay

## 2019-04-17 ENCOUNTER — Encounter: Payer: Self-pay | Admitting: Nurse Practitioner

## 2019-04-17 ENCOUNTER — Ambulatory Visit (INDEPENDENT_AMBULATORY_CARE_PROVIDER_SITE_OTHER): Payer: Managed Care, Other (non HMO) | Admitting: Nurse Practitioner

## 2019-04-17 VITALS — BP 116/80 | HR 76 | Temp 98.3°F | Ht 71.4 in | Wt 236.0 lb

## 2019-04-17 DIAGNOSIS — I10 Essential (primary) hypertension: Secondary | ICD-10-CM | POA: Diagnosis not present

## 2019-04-17 DIAGNOSIS — L309 Dermatitis, unspecified: Secondary | ICD-10-CM

## 2019-04-17 DIAGNOSIS — F988 Other specified behavioral and emotional disorders with onset usually occurring in childhood and adolescence: Secondary | ICD-10-CM

## 2019-04-17 DIAGNOSIS — E782 Mixed hyperlipidemia: Secondary | ICD-10-CM | POA: Diagnosis not present

## 2019-04-17 MED ORDER — LISINOPRIL-HYDROCHLOROTHIAZIDE 20-25 MG PO TABS: 1.0000 | ORAL_TABLET | Freq: Every day | ORAL | 1 refills | Status: DC

## 2019-04-17 MED ORDER — TRIAMCINOLONE ACETONIDE 0.5 % EX OINT: 1.0000 "application " | TOPICAL_OINTMENT | Freq: Two times a day (BID) | CUTANEOUS | 0 refills | Status: DC

## 2019-04-17 MED ORDER — AMLODIPINE BESYLATE 10 MG PO TABS: 10.0000 mg | ORAL_TABLET | Freq: Every day | ORAL | 1 refills | Status: DC

## 2019-04-17 MED ORDER — PRAVASTATIN SODIUM 20 MG PO TABS: 20.0000 mg | ORAL_TABLET | Freq: Every day | ORAL | 1 refills | Status: DC

## 2019-04-17 NOTE — Progress Notes (Signed)
This visit occurred during the SARS-CoV-2 public health emergency.  Safety protocols were in place, including screening questions prior to the visit, additional usage of staff PPE, and extensive cleaning of exam room while observing appropriate contact time as indicated for disinfecting solutions.  Subjective:     Patient ID: Steven Gibson , male    DOB: [birth year] , 48 y.o.   MRN: [id number]   Chief Complaint  Patient presents with  . Hypertension  . Rash    HPI  When he had graves disease his blood pressure was elevated.    Hypertension This is a chronic problem. The current episode started more than 1 year ago. The problem is unchanged. The problem is controlled. Pertinent negatives include no anxiety, chest pain or palpitations. Risk factors for coronary artery disease include male gender, obesity and sedentary lifestyle. Past treatments include angiotensin blockers, diuretics and calcium channel blockers. There are no compliance problems.  There is no history of angina. There is no history of chronic renal disease.  Rash     Past Medical History:  Diagnosis Date  . Graves disease      Family History  Problem Relation Age of Onset  . Hypertension Mother   . Hypertension Father   . Hypertension Brother   . Cancer Paternal Grandfather   . Hypertension Brother      Current Outpatient Medications:  .  amphetamine-dextroamphetamine (ADDERALL) 15 MG tablet, Take 1 tablet by mouth 4 (four) times daily., Disp: 120 tablet, Rfl: 0 .  aspirin EC 81 MG tablet, Take by mouth., Disp: , Rfl:  .  escitalopram (LEXAPRO) 20 MG tablet, , Disp: , Rfl:  .  amLODipine (NORVASC) 10 MG tablet, Take 1 tablet (10 mg total) by mouth daily., Disp: 90 tablet, Rfl: 1 .  lisinopril-hydrochlorothiazide (ZESTORETIC) 20-25 MG tablet, Take 1 tablet by mouth daily., Disp: 90 tablet, Rfl: 1 .  pravastatin (PRAVACHOL) 20 MG tablet, Take 1 tablet (20 mg total) by mouth daily., Disp: 90 tablet,  Rfl: 1   No Known Allergies   Review of Systems  Constitutional: Negative.   Respiratory: Negative.   Cardiovascular: Negative.  Negative for chest pain, palpitations and leg swelling.  Skin: Positive for rash.  Neurological: Negative.   Psychiatric/Behavioral: Negative.      Today's Vitals   04/17/19 1106  BP: 116/80  Pulse: 76  Temp: 98.3 F (36.8 C)  TempSrc: Oral  Weight: 236 lb (107 kg)  Height: 5' 11.4" (1.814 m)  PainSc: 0-No pain   Body mass index is 32.55 kg/m.   Objective:  Physical Exam Vitals signs reviewed.  Constitutional:      Appearance: Normal appearance.  Cardiovascular:     Rate and Rhythm: Normal rate and regular rhythm.     Pulses: Normal pulses.     Heart sounds: Normal heart sounds. No murmur.  Pulmonary:     Effort: Pulmonary effort is normal. No respiratory distress.     Breath sounds: Normal breath sounds.  Skin:    Capillary Refill: Capillary refill takes less than 2 seconds.     Findings: Rash (scaly dry, rash to posterior back) present.  Neurological:     General: No focal deficit present.     Mental Status: He is alert and oriented to person, place, and time.  Psychiatric:        Mood and Affect: Mood normal.        Behavior: Behavior normal.        Thought Content:  Thought content normal.        Judgment: Judgment normal.         Assessment And Plan:     1. Essential hypertension . B/P is controlled.  . CMP ordered to check renal function.  . The importance of regular exercise and dietary modification was stressed to the patient.   2. Mixed hyperlipidemia  Chronic, controlled  Continue with current medications  No labs this visit  3. Eczema, unspecified type  Previous history of rash during change of season months  Has dry scaly skin to posterior back  Rx for triamnicilone.  - triamcinolone ointment (KENALOG) 0.5 %; Apply 1 application topically 2 (two) times daily.  Dispense: 30 g; Refill: 0  4. Attention  deficit disorder, unspecified hyperactivity presence  Chronic, stable  Will refill adderrall when needed, has recently had a refill   [name], FNP    THE PATIENT IS ENCOURAGED TO PRACTICE SOCIAL DISTANCING DUE TO THE COVID-19 PANDEMIC.

## 2019-05-01 ENCOUNTER — Telehealth: Payer: Self-pay

## 2019-05-01 ENCOUNTER — Other Ambulatory Visit: Payer: Self-pay | Admitting: Nurse Practitioner

## 2019-05-01 DIAGNOSIS — F988 Other specified behavioral and emotional disorders with onset usually occurring in childhood and adolescence: Secondary | ICD-10-CM

## 2019-05-01 MED ORDER — AMPHETAMINE-DEXTROAMPHETAMINE 15 MG PO TABS: 15.0000 mg | ORAL_TABLET | Freq: Four times a day (QID) | ORAL | 0 refills | Status: DC

## 2019-05-01 NOTE — Telephone Encounter (Signed)
Please refill patient's prescription 

## 2019-05-01 NOTE — Telephone Encounter (Signed)
PT REQ THAT RX FOR ADDERALL BE SENT TO [facility] IN [place] [address] DUE TO HE IS AT [id number] [place] [id number] PHARMACY ADDRESS VERIFIED W/PT

## 2019-05-25 ENCOUNTER — Encounter: Payer: Self-pay | Admitting: Nurse Practitioner

## 2019-05-27 ENCOUNTER — Other Ambulatory Visit: Payer: Self-pay | Admitting: Nurse Practitioner

## 2019-05-27 DIAGNOSIS — F988 Other specified behavioral and emotional disorders with onset usually occurring in childhood and adolescence: Secondary | ICD-10-CM

## 2019-05-28 ENCOUNTER — Encounter: Payer: Self-pay | Admitting: Nurse Practitioner

## 2019-05-28 ENCOUNTER — Other Ambulatory Visit: Payer: Self-pay | Admitting: Nurse Practitioner

## 2019-05-28 DIAGNOSIS — F988 Other specified behavioral and emotional disorders with onset usually occurring in childhood and adolescence: Secondary | ICD-10-CM

## 2019-05-28 MED ORDER — AMPHETAMINE-DEXTROAMPHETAMINE 15 MG PO TABS: 15.0000 mg | ORAL_TABLET | Freq: Four times a day (QID) | ORAL | 0 refills | Status: DC

## 2019-05-28 NOTE — Telephone Encounter (Signed)
Adderral refill

## 2019-06-05 ENCOUNTER — Other Ambulatory Visit: Payer: Self-pay | Admitting: Nurse Practitioner

## 2019-06-05 DIAGNOSIS — L309 Dermatitis, unspecified: Secondary | ICD-10-CM

## 2019-06-18 ENCOUNTER — Other Ambulatory Visit: Payer: Self-pay | Admitting: Nurse Practitioner

## 2019-06-18 DIAGNOSIS — F988 Other specified behavioral and emotional disorders with onset usually occurring in childhood and adolescence: Secondary | ICD-10-CM

## 2019-06-18 MED ORDER — AMPHETAMINE-DEXTROAMPHETAMINE 15 MG PO TABS: 15.0000 mg | ORAL_TABLET | Freq: Four times a day (QID) | ORAL | 0 refills | Status: DC

## 2019-06-18 NOTE — Telephone Encounter (Signed)
adderall

## 2019-07-04 ENCOUNTER — Ambulatory Visit: Payer: Managed Care, Other (non HMO) | Attending: Internal Medicine

## 2019-07-04 DIAGNOSIS — Z20822 Contact with and (suspected) exposure to covid-19: Secondary | ICD-10-CM

## 2019-07-06 LAB — NOVEL CORONAVIRUS, NAA: SARS-CoV-2, NAA: NOT DETECTED

## 2019-07-18 ENCOUNTER — Other Ambulatory Visit: Payer: Self-pay | Admitting: Nurse Practitioner

## 2019-07-18 DIAGNOSIS — F988 Other specified behavioral and emotional disorders with onset usually occurring in childhood and adolescence: Secondary | ICD-10-CM

## 2019-07-18 DIAGNOSIS — L309 Dermatitis, unspecified: Secondary | ICD-10-CM

## 2019-07-18 MED ORDER — TRIAMCINOLONE ACETONIDE 0.5 % EX OINT: TOPICAL_OINTMENT | Freq: Two times a day (BID) | CUTANEOUS | 0 refills | Status: DC

## 2019-07-18 MED ORDER — AMPHETAMINE-DEXTROAMPHETAMINE 15 MG PO TABS: 15.0000 mg | ORAL_TABLET | Freq: Four times a day (QID) | ORAL | 0 refills | Status: DC

## 2019-07-18 NOTE — Telephone Encounter (Signed)
Patient requesting refill of adderall 15mg 

## 2019-07-19 ENCOUNTER — Ambulatory Visit: Payer: Managed Care, Other (non HMO) | Attending: Internal Medicine

## 2019-07-19 ENCOUNTER — Other Ambulatory Visit: Payer: Self-pay

## 2019-07-19 DIAGNOSIS — Z20822 Contact with and (suspected) exposure to covid-19: Secondary | ICD-10-CM

## 2019-07-20 LAB — NOVEL CORONAVIRUS, NAA: SARS-CoV-2, NAA: NOT DETECTED

## 2019-07-30 ENCOUNTER — Other Ambulatory Visit: Payer: Self-pay

## 2019-08-02 ENCOUNTER — Ambulatory Visit: Payer: Managed Care, Other (non HMO) | Attending: Internal Medicine

## 2019-08-02 ENCOUNTER — Other Ambulatory Visit: Payer: Self-pay

## 2019-08-02 DIAGNOSIS — Z20822 Contact with and (suspected) exposure to covid-19: Secondary | ICD-10-CM

## 2019-08-03 LAB — NOVEL CORONAVIRUS, NAA: SARS-CoV-2, NAA: NOT DETECTED

## 2019-08-08 ENCOUNTER — Encounter: Payer: Self-pay | Admitting: Nurse Practitioner

## 2019-08-08 ENCOUNTER — Ambulatory Visit (INDEPENDENT_AMBULATORY_CARE_PROVIDER_SITE_OTHER): Payer: Managed Care, Other (non HMO) | Admitting: Nurse Practitioner

## 2019-08-08 ENCOUNTER — Other Ambulatory Visit: Payer: Self-pay

## 2019-08-08 VITALS — BP 138/84 | HR 87 | Temp 98.0°F | Ht 71.0 in | Wt 232.0 lb

## 2019-08-08 DIAGNOSIS — Z0000 Encounter for general adult medical examination without abnormal findings: Secondary | ICD-10-CM

## 2019-08-08 DIAGNOSIS — Z125 Encounter for screening for malignant neoplasm of prostate: Secondary | ICD-10-CM | POA: Diagnosis not present

## 2019-08-08 DIAGNOSIS — Z1211 Encounter for screening for malignant neoplasm of colon: Secondary | ICD-10-CM

## 2019-08-08 DIAGNOSIS — I10 Essential (primary) hypertension: Secondary | ICD-10-CM

## 2019-08-08 DIAGNOSIS — L309 Dermatitis, unspecified: Secondary | ICD-10-CM | POA: Diagnosis not present

## 2019-08-08 DIAGNOSIS — E782 Mixed hyperlipidemia: Secondary | ICD-10-CM

## 2019-08-08 DIAGNOSIS — F988 Other specified behavioral and emotional disorders with onset usually occurring in childhood and adolescence: Secondary | ICD-10-CM

## 2019-08-08 MED ORDER — CLOBETASOL PROPIONATE 0.05 % EX OINT: 1.0000 "application " | TOPICAL_OINTMENT | Freq: Two times a day (BID) | CUTANEOUS | 2 refills | Status: DC

## 2019-08-08 MED ORDER — CLOBETASOL PROPIONATE 0.05 % EX OINT: 1.0000 "application " | TOPICAL_OINTMENT | Freq: Two times a day (BID) | CUTANEOUS | 0 refills | Status: DC

## 2019-08-08 NOTE — Progress Notes (Addendum)
Subjective:     Patient ID: Steven Gibson , male    DOB: 1971/01/08 , 49 y.o.   MRN: [id number]   Chief Complaint  Patient presents with  . Annual Exam   Men's preventive visit. Patient Health Questionnaire (PHQ-2) is     Office Visit from 08/08/2019 in [name]  PHQ-2 Total Score  0     Patient is on a regular diet, continues to not eat red meat. Mostly chicken and [place].  Marital status: Single. Relevant history for alcohol use is:   Social History   Substance and Sexual Activity  Alcohol Use Yes   Relevant history for tobacco use is:  Social History   Tobacco Use  Smoking Status Current Some Day Smoker  . Packs/day: 0.25  . Years: 20.00  . Pack years: 5.00  . 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   HPI  Here for HM.  He had his 1st covid vaccine his second one due April 1st.      Past Medical History:  Diagnosis Date  . Graves disease      Family History  Problem Relation Age of Onset  . Hypertension Mother   . Hypertension Father   . Hypertension Brother   . Cancer Paternal Grandfather   . Hypertension Brother      Current Outpatient Medications:  .  amLODipine (NORVASC) 10 MG tablet, Take 1 tablet (10 mg total) by mouth daily., Disp: 90 tablet, Rfl: 1 .  amphetamine-dextroamphetamine (ADDERALL) 15 MG tablet, Take 1 tablet by mouth 4 (four) times daily., Disp: 120 tablet, Rfl: 0 .  aspirin EC 81 MG tablet, Take by mouth., Disp: , Rfl:  .  escitalopram (LEXAPRO) 20 MG tablet, , Disp: , Rfl:  .  lisinopril-hydrochlorothiazide (ZESTORETIC) 20-25 MG tablet, Take 1 tablet by mouth daily., Disp: 90 tablet, Rfl: 1 .  pravastatin (PRAVACHOL) 20 MG tablet, Take 1 tablet (20 mg total) by mouth daily., Disp: 90 tablet, Rfl: 1 .  triamcinolone ointment (KENALOG) 0.5 %, Apply topically 2 (two) times daily., Disp: 30 g, Rfl: 0   No  Known Allergies   Review of Systems  Constitutional: Negative.   HENT: Negative.   Eyes: Negative.   Respiratory: Negative.   Cardiovascular: Negative.   Gastrointestinal: Negative.   Endocrine: Negative.   Genitourinary: Negative.   Musculoskeletal: Negative.   Allergic/Immunologic: Negative.   Neurological: Negative for dizziness and headaches.  Hematological: Negative.   Psychiatric/Behavioral: Negative.      Today's Vitals   08/08/19 0851  BP: 138/84  Pulse: 87  Temp: 98 F (36.7 C)  TempSrc: Oral  Weight: 232 lb (105.2 kg)  Height: 5' 11" (1.803 m)  PainSc: 0-No pain   Body mass index is 32.36 kg/m.   Objective:  Physical Exam Vitals reviewed.  Constitutional:      Appearance: Normal appearance. He is obese. He is not ill-appearing.  HENT:     Head: Normocephalic and atraumatic.     Right Ear: Tympanic membrane, ear canal and external ear normal. There is no impacted cerumen.     Left Ear: Tympanic membrane, ear canal and external ear normal. There is no impacted cerumen.     Nose: Nose normal. No congestion.     Mouth/Throat:     Mouth: Mucous membranes are moist.  Eyes:     Extraocular Movements: Extraocular movements intact.  Conjunctiva/sclera: Conjunctivae normal.     Pupils: Pupils are equal, round, and reactive to light.  Cardiovascular:     Rate and Rhythm: Normal rate and regular rhythm.     Pulses: Normal pulses.     Heart sounds: Normal heart sounds. No murmur.  Pulmonary:     Effort: Pulmonary effort is normal.     Breath sounds: Normal breath sounds.  Abdominal:     General: Abdomen is flat. Bowel sounds are normal.     Palpations: Abdomen is soft.  Genitourinary:    Penis: Normal.   Musculoskeletal:        General: No tenderness. Normal range of motion.     Cervical back: Normal range of motion and neck supple. No rigidity.  Skin:    General: Skin is warm and dry.     Capillary Refill: Capillary refill takes less than 2 seconds.      Findings: Rash (posterior back with fine raised rash) present.  Neurological:     General: No focal deficit present.     Mental Status: He is alert and oriented to person, place, and time.  Psychiatric:        Mood and Affect: Mood normal.        Behavior: Behavior normal.        Thought Content: Thought content normal.        Judgment: Judgment normal.         Assessment And Plan:     1. Health maintenance examination Behavior modifications discussed and diet history reviewed.   Pt will continue to exercise regularly and modify diet with low GI, plant based foods and decrease intake of processed foods.  Recommend intake of daily multivitamin, Vitamin D, and calcium.  Recommend mammogram and colonoscopy for preventive screenings, as well as recommend immunizations that include influenza, TDAP (will get at work and in the process of getting his covid vaccine) - Hemoglobin A1c  2. Encounter for prostate cancer screening  - PSA  3. Essential hypertension . B/P is controlled.  . CMP ordered to check renal function.  . The importance of regular exercise and dietary modification was stressed to the patient.  . EKG done with NSR HR 76 - POCT Urinalysis Dipstick (81002) - POCT UA - Microalbumin - EKG 12-Lead - CMP14+EGFR  4. Attention deficit disorder, unspecified hyperactivity presence  Chronic, doing well  Does not need a refill today  5. Mixed hyperlipidemia  Chronic, stable  Continue with current medications - Lipid panel - CBC  6. Eczema, unspecified type  Continues to have pruritic rash to back  Will try clobetasol - clobetasol ointment (TEMOVATE) 0.05 %; Apply 1 application topically 2 (two) times daily.  Dispense: 30 g; Refill: 2  7. Encounter for screening colonoscopy According to USPTF Colorectal cancer Screening guidelines. Colonoscopy is recommended every 10 years, starting at age 93years. Will refer to GI for colon cancer screening. He is over the  age of 58 and african american which increases his chances of developing colon cancer - Ambulatory referral to Gastroenterology   Minette Brine, FNP

## 2019-08-08 NOTE — Patient Instructions (Signed)

## 2019-08-09 LAB — CMP14+EGFR
ALT: 24 IU/L (ref 0–44)
AST: 33 IU/L (ref 0–40)
Albumin/Globulin Ratio: 1.6 (ref 1.2–2.2)
Albumin: 4.3 g/dL (ref 4.0–5.0)
Alkaline Phosphatase: 107 IU/L (ref 39–117)
BUN/Creatinine Ratio: 16 (ref 9–20)
BUN: 18 mg/dL (ref 6–24)
Bilirubin Total: <0.2 mg/dL (ref 0.0–1.2)
CO2: 23 mmol/L (ref 20–29)
Calcium: 9.9 mg/dL (ref 8.7–10.2)
Chloride: 103 mmol/L (ref 96–106)
Creatinine, Ser: 1.13 mg/dL (ref 0.76–1.27)
GFR calc Af Amer: 88 mL/min/{1.73_m2} (ref >59)
GFR calc non Af Amer: 76 mL/min/{1.73_m2} (ref >59)
Globulin, Total: 2.7 g/dL (ref 1.5–4.5)
Glucose: 106 mg/dL — ABNORMAL HIGH (ref 65–99)
Potassium: 3.8 mmol/L (ref 3.5–5.2)
Sodium: 140 mmol/L (ref 134–144)
Total Protein: 7 g/dL (ref 6.0–8.5)

## 2019-08-09 LAB — LIPID PANEL
Chol/HDL Ratio: 4.7 ratio (ref 0.0–5.0)
Cholesterol, Total: 224 mg/dL — ABNORMAL HIGH (ref 100–199)
HDL: 48 mg/dL (ref >39)
LDL Chol Calc (NIH): 133 mg/dL — ABNORMAL HIGH (ref 0–99)
Triglycerides: 244 mg/dL — ABNORMAL HIGH (ref 0–149)
VLDL Cholesterol Cal: 43 mg/dL — ABNORMAL HIGH (ref 5–40)

## 2019-08-09 LAB — CBC
Hematocrit: 45 % (ref 37.5–51.0)
Hemoglobin: 15.1 g/dL (ref 13.0–17.7)
MCH: 28.2 pg (ref 26.6–33.0)
MCHC: 33.6 g/dL (ref 31.5–35.7)
MCV: 84 fL (ref 79–97)
Platelets: 340 10*3/uL (ref 150–450)
RBC: 5.36 x10E6/uL (ref 4.14–5.80)
RDW: 14.2 % (ref 11.6–15.4)
WBC: 5 10*3/uL (ref 3.4–10.8)

## 2019-08-09 LAB — HEMOGLOBIN A1C
Est. average glucose Bld gHb Est-mCnc: 120 mg/dL
Hgb A1c MFr Bld: 5.8 % — ABNORMAL HIGH (ref 4.8–5.6)

## 2019-08-09 LAB — PSA: Prostate Specific Ag, Serum: 0.7 ng/mL (ref 0.0–4.0)

## 2019-08-13 ENCOUNTER — Other Ambulatory Visit: Payer: Self-pay | Admitting: Nurse Practitioner

## 2019-08-13 DIAGNOSIS — F988 Other specified behavioral and emotional disorders with onset usually occurring in childhood and adolescence: Secondary | ICD-10-CM

## 2019-08-13 MED ORDER — AMPHETAMINE-DEXTROAMPHETAMINE 15 MG PO TABS: 15.0000 mg | ORAL_TABLET | Freq: Four times a day (QID) | ORAL | 0 refills | Status: DC

## 2019-08-13 NOTE — Telephone Encounter (Signed)
Patient request refill on Amphetamine-Dextroamphetamine (Adderall) 15mg  tab  Last OV- 08/08/19  Next OV- 11/08/19

## 2019-09-07 ENCOUNTER — Other Ambulatory Visit: Payer: Self-pay | Admitting: Nurse Practitioner

## 2019-09-07 DIAGNOSIS — F988 Other specified behavioral and emotional disorders with onset usually occurring in childhood and adolescence: Secondary | ICD-10-CM

## 2019-09-07 NOTE — Telephone Encounter (Signed)
Last 07/19/19 Next 11/08/19

## 2019-09-10 MED ORDER — AMPHETAMINE-DEXTROAMPHETAMINE 15 MG PO TABS: 15.0000 mg | ORAL_TABLET | Freq: Four times a day (QID) | ORAL | 0 refills | Status: DC

## 2019-10-07 ENCOUNTER — Other Ambulatory Visit: Payer: Self-pay | Admitting: Nurse Practitioner

## 2019-10-07 DIAGNOSIS — F988 Other specified behavioral and emotional disorders with onset usually occurring in childhood and adolescence: Secondary | ICD-10-CM

## 2019-10-07 NOTE — Telephone Encounter (Signed)
Adderall refill 

## 2019-10-08 MED ORDER — AMPHETAMINE-DEXTROAMPHETAMINE 15 MG PO TABS: 15.0000 mg | ORAL_TABLET | Freq: Four times a day (QID) | ORAL | 0 refills | Status: DC

## 2019-10-31 ENCOUNTER — Telehealth: Payer: Self-pay

## 2019-10-31 ENCOUNTER — Encounter: Payer: Self-pay | Admitting: Nurse Practitioner

## 2019-10-31 ENCOUNTER — Other Ambulatory Visit: Payer: Self-pay | Admitting: Nurse Practitioner

## 2019-10-31 DIAGNOSIS — F988 Other specified behavioral and emotional disorders with onset usually occurring in childhood and adolescence: Secondary | ICD-10-CM

## 2019-10-31 NOTE — Telephone Encounter (Signed)
Adderall refill Last visit 08/08/19 next visit 11/08/19

## 2019-10-31 NOTE — Telephone Encounter (Signed)
Looked to see who filled lexapro

## 2019-11-07 ENCOUNTER — Other Ambulatory Visit: Payer: Self-pay | Admitting: Nurse Practitioner

## 2019-11-07 DIAGNOSIS — F988 Other specified behavioral and emotional disorders with onset usually occurring in childhood and adolescence: Secondary | ICD-10-CM

## 2019-11-08 ENCOUNTER — Other Ambulatory Visit: Payer: Self-pay

## 2019-11-08 ENCOUNTER — Ambulatory Visit (INDEPENDENT_AMBULATORY_CARE_PROVIDER_SITE_OTHER): Payer: Managed Care, Other (non HMO) | Admitting: Nurse Practitioner

## 2019-11-08 ENCOUNTER — Encounter: Payer: Self-pay | Admitting: Nurse Practitioner

## 2019-11-08 VITALS — BP 132/80 | HR 85 | Temp 98.3°F | Ht 71.0 in | Wt 225.8 lb

## 2019-11-08 DIAGNOSIS — F988 Other specified behavioral and emotional disorders with onset usually occurring in childhood and adolescence: Secondary | ICD-10-CM | POA: Diagnosis not present

## 2019-11-08 DIAGNOSIS — I10 Essential (primary) hypertension: Secondary | ICD-10-CM

## 2019-11-08 DIAGNOSIS — Z1159 Encounter for screening for other viral diseases: Secondary | ICD-10-CM

## 2019-11-08 DIAGNOSIS — E782 Mixed hyperlipidemia: Secondary | ICD-10-CM | POA: Diagnosis not present

## 2019-11-08 DIAGNOSIS — R7309 Other abnormal glucose: Secondary | ICD-10-CM | POA: Diagnosis not present

## 2019-11-08 MED ORDER — AMLODIPINE BESYLATE 10 MG PO TABS: 10.0000 mg | ORAL_TABLET | Freq: Every day | ORAL | 1 refills | Status: DC

## 2019-11-08 MED ORDER — ESCITALOPRAM OXALATE 20 MG PO TABS: 20.0000 mg | ORAL_TABLET | Freq: Every day | ORAL | 1 refills | Status: DC

## 2019-11-08 MED ORDER — LISINOPRIL-HYDROCHLOROTHIAZIDE 20-25 MG PO TABS: 1.0000 | ORAL_TABLET | Freq: Every day | ORAL | 1 refills | Status: DC

## 2019-11-08 MED ORDER — AMPHETAMINE-DEXTROAMPHETAMINE 15 MG PO TABS: 15.0000 mg | ORAL_TABLET | Freq: Four times a day (QID) | ORAL | 0 refills | Status: DC

## 2019-11-08 MED ORDER — PRAVASTATIN SODIUM 20 MG PO TABS: 20.0000 mg | ORAL_TABLET | Freq: Every day | ORAL | 1 refills | Status: DC

## 2019-11-08 NOTE — Progress Notes (Signed)
This visit occurred during the SARS-CoV-2 public health emergency.  Safety protocols were in place, including screening questions prior to the visit, additional usage of staff PPE, and extensive cleaning of exam room while observing appropriate contact time as indicated for disinfecting solutions.  Subjective:     Patient ID: Steven Gibson , male    DOB: 1970-11-16 , 49 y.o.   MRN: [id number]   Chief Complaint  Patient presents with  . ADHD    HPI  Here for recheck ADHD.   Wt Readings from Last 3 Encounters: 11/08/19 : 225 lb 12.8 oz (102.4 kg) 08/08/19 : 232 lb (105.2 kg) 04/17/19 : 236 lb (107 kg)   Hypertension This is a chronic problem. The current episode started more than 1 year ago. The problem is unchanged. The problem is controlled. Pertinent negatives include no anxiety, chest pain, headaches or palpitations. Risk factors for coronary artery disease include male gender, obesity and sedentary lifestyle. Past treatments include angiotensin blockers, diuretics and calcium channel blockers. There are no compliance problems.  There is no history of angina. There is no history of chronic renal disease.     Past Medical History:  Diagnosis Date  . Graves disease      Family History  Problem Relation Age of Onset  . Hypertension Mother   . Hypertension Father   . Hypertension Brother   . Cancer Paternal Grandfather   . Hypertension Brother      Current Outpatient Medications:  .  amLODipine (NORVASC) 10 MG tablet, Take 1 tablet (10 mg total) by mouth daily., Disp: 90 tablet, Rfl: 1 .  amphetamine-dextroamphetamine (ADDERALL) 15 MG tablet, Take 1 tablet by mouth 4 (four) times daily., Disp: 120 tablet, Rfl: 0 .  clobetasol ointment (TEMOVATE) 3.08 %, Apply 1 application topically 2 (two) times daily., Disp: 30 g, Rfl: 2 .  escitalopram (LEXAPRO) 20 MG tablet, , Disp: , Rfl:  .  lisinopril-hydrochlorothiazide (ZESTORETIC) 20-25 MG tablet, Take 1 tablet by mouth  daily., Disp: 90 tablet, Rfl: 1 .  pravastatin (PRAVACHOL) 20 MG tablet, Take 1 tablet (20 mg total) by mouth daily., Disp: 90 tablet, Rfl: 1 .  aspirin EC 81 MG tablet, Take by mouth. (Patient not taking: Reported on 11/07/2019), Disp: , Rfl:    No Known Allergies   Review of Systems  Constitutional: Negative.  Negative for fatigue.  Respiratory: Negative.   Cardiovascular: Negative for chest pain, palpitations and leg swelling.  Neurological: Negative for dizziness and headaches.  Psychiatric/Behavioral: Negative.      Today's Vitals   11/08/19 0852  BP: 132/80  Pulse: 85  Temp: 98.3 F (36.8 C)  TempSrc: Oral  Weight: 225 lb 12.8 oz (102.4 kg)  Height: '5\' 11"'$  (1.803 m)   Body mass index is 31.49 kg/m.   Objective:  Physical Exam Vitals reviewed.  Constitutional:      General: He is not in acute distress.    Appearance: Normal appearance. He is obese.  Cardiovascular:     Rate and Rhythm: Normal rate and regular rhythm.     Pulses: Normal pulses.     Heart sounds: Normal heart sounds. No murmur heard.   Pulmonary:     Effort: Pulmonary effort is normal. No respiratory distress.     Breath sounds: Normal breath sounds.  Skin:    Capillary Refill: Capillary refill takes less than 2 seconds.  Neurological:     General: No focal deficit present.     Mental Status: He is  alert and oriented to person, place, and time.  Psychiatric:        Mood and Affect: Mood normal.        Behavior: Behavior normal.        Thought Content: Thought content normal.        Judgment: Judgment normal.         Assessment And Plan:      1. Attention deficit disorder, unspecified hyperactivity presence  Chronic, controlled  Continue with current medications - escitalopram (LEXAPRO) 20 MG tablet; Take 1 tablet (20 mg total) by mouth daily.  Dispense: 90 tablet; Refill: 1  2. Essential hypertension . B/P is controlled.  . CMP ordered to check renal function.  . The importance of  regular exercise and dietary modification was stressed to the patient.   - CMP14+EGFR - lisinopril-hydrochlorothiazide (ZESTORETIC) 20-25 MG tablet; Take 1 tablet by mouth daily.  Dispense: 90 tablet; Refill: 1 - amLODipine (NORVASC) 10 MG tablet; Take 1 tablet (10 mg total) by mouth daily.  Dispense: 90 tablet; Refill: 1  3. Mixed hyperlipidemia  Chronic, controlled  Admits to not taking his pravastatin Continue with current medications - CMP14+EGFR - Lipid panel - pravastatin (PRAVACHOL) 20 MG tablet; Take 1 tablet (20 mg total) by mouth daily.  Dispense: 90 tablet; Refill: 1  4. Abnormal glucose  Chronic, stable  Continue to avoid sugary foods and drinks  5. Encounter for hepatitis C screening test for low risk patient  Will check Hepatitis C screening due to recent recommendations to screen all adults 18 years and older   Minette Brine, FNP    THE PATIENT IS ENCOURAGED TO PRACTICE SOCIAL DISTANCING DUE TO THE COVID-19 PANDEMIC.

## 2019-11-08 NOTE — Telephone Encounter (Signed)
Adderall refill 

## 2019-11-09 LAB — CMP14+EGFR
ALT: 32 IU/L (ref 0–44)
AST: 30 IU/L (ref 0–40)
Albumin/Globulin Ratio: 1.8 (ref 1.2–2.2)
Albumin: 4.2 g/dL (ref 4.0–5.0)
Alkaline Phosphatase: 112 IU/L (ref 48–121)
BUN/Creatinine Ratio: 15 (ref 9–20)
BUN: 14 mg/dL (ref 6–24)
Bilirubin Total: <0.2 mg/dL (ref 0.0–1.2)
CO2: 25 mmol/L (ref 20–29)
Calcium: 9.5 mg/dL (ref 8.7–10.2)
Chloride: 107 mmol/L — ABNORMAL HIGH (ref 96–106)
Creatinine, Ser: 0.92 mg/dL (ref 0.76–1.27)
GFR calc Af Amer: 113 mL/min/{1.73_m2} (ref >59)
GFR calc non Af Amer: 97 mL/min/{1.73_m2} (ref >59)
Globulin, Total: 2.4 g/dL (ref 1.5–4.5)
Glucose: 108 mg/dL — ABNORMAL HIGH (ref 65–99)
Potassium: 4 mmol/L (ref 3.5–5.2)
Sodium: 143 mmol/L (ref 134–144)
Total Protein: 6.6 g/dL (ref 6.0–8.5)

## 2019-11-09 LAB — LIPID PANEL
Chol/HDL Ratio: 4 ratio (ref 0.0–5.0)
Cholesterol, Total: 206 mg/dL — ABNORMAL HIGH (ref 100–199)
HDL: 51 mg/dL (ref >39)
LDL Chol Calc (NIH): 130 mg/dL — ABNORMAL HIGH (ref 0–99)
Triglycerides: 143 mg/dL (ref 0–149)
VLDL Cholesterol Cal: 25 mg/dL (ref 5–40)

## 2019-11-09 LAB — HEPATITIS C ANTIBODY: Hep C Virus Ab: <0.1 s/co ratio (ref 0.0–0.9)

## 2019-12-06 ENCOUNTER — Other Ambulatory Visit: Payer: Self-pay | Admitting: Nurse Practitioner

## 2019-12-06 DIAGNOSIS — F988 Other specified behavioral and emotional disorders with onset usually occurring in childhood and adolescence: Secondary | ICD-10-CM

## 2019-12-06 MED ORDER — AMPHETAMINE-DEXTROAMPHETAMINE 15 MG PO TABS: 15.0000 mg | ORAL_TABLET | Freq: Four times a day (QID) | ORAL | 0 refills | Status: DC

## 2019-12-06 NOTE — Telephone Encounter (Signed)
Amphetamine-dextroamphetamine refill 

## 2020-01-06 ENCOUNTER — Other Ambulatory Visit: Payer: Self-pay | Admitting: Nurse Practitioner

## 2020-01-06 DIAGNOSIS — F988 Other specified behavioral and emotional disorders with onset usually occurring in childhood and adolescence: Secondary | ICD-10-CM

## 2020-01-07 MED ORDER — AMPHETAMINE-DEXTROAMPHETAMINE 15 MG PO TABS: 15.0000 mg | ORAL_TABLET | Freq: Four times a day (QID) | ORAL | 0 refills | Status: DC

## 2020-01-07 NOTE — Telephone Encounter (Signed)
Please refill patient's appointment [facility]

## 2020-01-18 ENCOUNTER — Other Ambulatory Visit: Payer: Managed Care, Other (non HMO)

## 2020-01-18 ENCOUNTER — Other Ambulatory Visit: Payer: Self-pay

## 2020-01-18 ENCOUNTER — Other Ambulatory Visit: Payer: Self-pay | Admitting: *Deleted

## 2020-01-18 DIAGNOSIS — Z20822 Contact with and (suspected) exposure to covid-19: Secondary | ICD-10-CM

## 2020-01-19 LAB — NOVEL CORONAVIRUS, NAA: SARS-CoV-2, NAA: NOT DETECTED

## 2020-02-11 ENCOUNTER — Other Ambulatory Visit: Payer: Self-pay | Admitting: Nurse Practitioner

## 2020-02-11 DIAGNOSIS — F988 Other specified behavioral and emotional disorders with onset usually occurring in childhood and adolescence: Secondary | ICD-10-CM

## 2020-02-11 NOTE — Telephone Encounter (Signed)
Adderrall refill.

## 2020-02-12 MED ORDER — AMPHETAMINE-DEXTROAMPHETAMINE 15 MG PO TABS: 15.0000 mg | ORAL_TABLET | Freq: Four times a day (QID) | ORAL | 0 refills | Status: DC

## 2020-02-14 ENCOUNTER — Ambulatory Visit (INDEPENDENT_AMBULATORY_CARE_PROVIDER_SITE_OTHER): Payer: Managed Care, Other (non HMO) | Admitting: Nurse Practitioner

## 2020-02-14 ENCOUNTER — Encounter: Payer: Self-pay | Admitting: Nurse Practitioner

## 2020-02-14 ENCOUNTER — Other Ambulatory Visit: Payer: Self-pay

## 2020-02-14 VITALS — BP 122/80 | HR 83 | Temp 98.0°F | Ht 71.2 in | Wt 227.4 lb

## 2020-02-14 DIAGNOSIS — M79602 Pain in left arm: Secondary | ICD-10-CM

## 2020-02-14 DIAGNOSIS — F988 Other specified behavioral and emotional disorders with onset usually occurring in childhood and adolescence: Secondary | ICD-10-CM

## 2020-02-14 DIAGNOSIS — I10 Essential (primary) hypertension: Secondary | ICD-10-CM | POA: Diagnosis not present

## 2020-02-14 DIAGNOSIS — E782 Mixed hyperlipidemia: Secondary | ICD-10-CM | POA: Diagnosis not present

## 2020-02-14 NOTE — Progress Notes (Signed)
I,Steven Gibson as a Neurosurgeon for [employer], FNP.,have documented all relevant documentation on the behalf of [name], FNP,as directed by  [name], FNP while in the presence of [name], FNP. This visit occurred during the SARS-CoV-2 public health emergency.  Safety protocols were in place, including screening questions prior to the visit, additional usage of staff PPE, and extensive cleaning of exam room while observing appropriate contact time as indicated for disinfecting solutions.  Subjective:     Patient ID: Steven Gibson , male    DOB: 18-Feb-1971 , 49 y.o.   MRN: [id number]   Chief Complaint  Patient presents with  . ADHD  . Hypertension  . Hyperlipidemia    HPI  Here for recheck ADHD. He has seen his psychiatrist (Dr. Karleen Hampshire) who increased his Lexapro to 20 mg daily.   Wt Readings from Last 3 Encounters: 02/14/20 : 227 lb 6.4 oz (103.1 kg) 11/08/19 : 225 lb 12.8 oz (102.4 kg) 08/08/19 : 232 lb (105.2 kg)  He does exercise and roller skates. He sees Dr. Farris [facility] orthopedics.  He has looked at his left shoulder in the past.     Hypertension This is a chronic problem. The current episode started more than 1 year ago. The problem is unchanged. The problem is controlled. Pertinent negatives include no anxiety, chest pain, headaches or palpitations. Risk factors for coronary artery disease include male gender, obesity and sedentary lifestyle. Past treatments include angiotensin blockers, diuretics and calcium channel blockers. There are no compliance problems.  There is no history of angina. There is no history of chronic renal disease.  Hyperlipidemia This is a chronic problem. The current episode started more than 1 year ago. The problem is controlled. Recent lipid tests were reviewed and are normal. He has no history of chronic renal disease. Pertinent negatives include no chest pain. There are no compliance problems.  Risk factors for  coronary artery disease include male sex and obesity.     Past Medical History:  Diagnosis Date  . Graves disease      Family History  Problem Relation Age of Onset  . Hypertension Mother   . Hypertension Father   . Hypertension Brother   . Cancer Paternal Grandfather   . Hypertension Brother      Current Outpatient Medications:  .  amLODipine (NORVASC) 10 MG tablet, Take 1 tablet (10 mg total) by mouth daily., Disp: 90 tablet, Rfl: 1 .  amphetamine-dextroamphetamine (ADDERALL) 15 MG tablet, Take 1 tablet by mouth 4 (four) times daily., Disp: 120 tablet, Rfl: 0 .  aspirin EC 81 MG tablet, Take by mouth. , Disp: , Rfl:  .  clobetasol ointment (TEMOVATE) 0.05 %, Apply 1 application topically 2 (two) times daily., Disp: 30 g, Rfl: 2 .  escitalopram (LEXAPRO) 20 MG tablet, Take 1 tablet (20 mg total) by mouth daily., Disp: 90 tablet, Rfl: 1 .  lisinopril-hydrochlorothiazide (ZESTORETIC) 20-25 MG tablet, Take 1 tablet by mouth daily., Disp: 90 tablet, Rfl: 1 .  pravastatin (PRAVACHOL) 20 MG tablet, Take 1 tablet (20 mg total) by mouth daily., Disp: 90 tablet, Rfl: 1   No Known Allergies   Review of Systems  Constitutional: Negative.   Eyes:       Feels like his left eye is irritated when waking up due to his graves disease.  He also has an elevated area to the sclera to the outer area.  Respiratory: Negative.  Negative for cough.   Cardiovascular: Negative for chest  pain, palpitations and leg swelling.  Musculoskeletal: Positive for arthralgias.       Left shoulder pain  Neurological: Negative for dizziness and headaches.     Today's Vitals   02/14/20 0841  BP: 122/80  Pulse: 83  Temp: 98 F (36.7 C)  TempSrc: Oral  Weight: 227 lb 6.4 oz (103.1 kg)  Height: 5' 11.2" (1.808 m)  PainSc: 0-No pain   Body mass index is 31.54 kg/m.   Objective:  Physical Exam Constitutional:      General: He is not in acute distress.    Appearance: Normal appearance.  Cardiovascular:      Rate and Rhythm: Normal rate and regular rhythm.     Pulses: Normal pulses.     Heart sounds: Normal heart sounds. No murmur heard.   Pulmonary:     Effort: Pulmonary effort is normal. No respiratory distress.     Breath sounds: Normal breath sounds.  Musculoskeletal:        General: Tenderness (anterior bursa space and pain with range of motion) present.  Skin:    Capillary Refill: Capillary refill takes less than 2 seconds.  Neurological:     General: No focal deficit present.     Mental Status: He is alert and oriented to person, place, and time.     Cranial Nerves: No cranial nerve deficit.  Psychiatric:        Mood and Affect: Mood normal.        Behavior: Behavior normal.        Thought Content: Thought content normal.        Judgment: Judgment normal.         Assessment And Plan:     1. Attention deficit disorder, unspecified hyperactivity presence  Chronic, stable  He has seen his psychiatrist recently who has not recommended any changes  Refill was sent on 9/28  2. Essential hypertension . B/P is well controlled.  . Continue current medications  3. Mixed hyperlipidemia  Chronic, controlled  Continue with current medications, tolerating well  4. Left arm pain   pain to anterior bursa space, has pain with hyperextension, positive neer's   I have advised him to contact his orthopedic who has looked at his shoulder in the past for further evaluation   Patient was given opportunity to ask questions. Patient verbalized understanding of the plan and was able to repeat key elements of the plan. All questions were answered to their satisfaction.    [name], FNP, have reviewed all documentation for this visit. The documentation on 02/14/20 for the exam, diagnosis, procedures, and orders are all accurate and complete.   THE PATIENT IS ENCOURAGED TO PRACTICE SOCIAL DISTANCING DUE TO THE COVID-19 PANDEMIC.

## 2020-03-12 ENCOUNTER — Other Ambulatory Visit: Payer: Self-pay | Admitting: Nurse Practitioner

## 2020-03-12 DIAGNOSIS — F988 Other specified behavioral and emotional disorders with onset usually occurring in childhood and adolescence: Secondary | ICD-10-CM

## 2020-03-12 MED ORDER — AMPHETAMINE-DEXTROAMPHETAMINE 15 MG PO TABS: 15.0000 mg | ORAL_TABLET | Freq: Four times a day (QID) | ORAL | 0 refills | Status: DC

## 2020-03-12 NOTE — Telephone Encounter (Signed)
Adderall

## 2020-03-31 ENCOUNTER — Encounter: Payer: Self-pay | Admitting: Nurse Practitioner

## 2020-04-08 ENCOUNTER — Ambulatory Visit (INDEPENDENT_AMBULATORY_CARE_PROVIDER_SITE_OTHER): Payer: Managed Care, Other (non HMO) | Admitting: Nurse Practitioner

## 2020-04-08 ENCOUNTER — Encounter: Payer: Self-pay | Admitting: Nurse Practitioner

## 2020-04-08 ENCOUNTER — Other Ambulatory Visit: Payer: Self-pay

## 2020-04-08 VITALS — BP 132/80 | HR 92 | Temp 98.4°F | Ht 72.4 in | Wt 221.6 lb

## 2020-04-08 DIAGNOSIS — E782 Mixed hyperlipidemia: Secondary | ICD-10-CM

## 2020-04-08 DIAGNOSIS — Z01818 Encounter for other preprocedural examination: Secondary | ICD-10-CM

## 2020-04-08 DIAGNOSIS — F988 Other specified behavioral and emotional disorders with onset usually occurring in childhood and adolescence: Secondary | ICD-10-CM

## 2020-04-08 DIAGNOSIS — I10 Essential (primary) hypertension: Secondary | ICD-10-CM | POA: Diagnosis not present

## 2020-04-08 DIAGNOSIS — G8929 Other chronic pain: Secondary | ICD-10-CM

## 2020-04-08 DIAGNOSIS — M25562 Pain in left knee: Secondary | ICD-10-CM | POA: Diagnosis not present

## 2020-04-08 DIAGNOSIS — R7309 Other abnormal glucose: Secondary | ICD-10-CM

## 2020-04-08 MED ORDER — AMPHETAMINE-DEXTROAMPHETAMINE 15 MG PO TABS: 15.0000 mg | ORAL_TABLET | Freq: Four times a day (QID) | ORAL | 0 refills | Status: DC

## 2020-04-08 MED ORDER — PRAVASTATIN SODIUM 20 MG PO TABS: 20.0000 mg | ORAL_TABLET | Freq: Every day | ORAL | 1 refills | Status: DC

## 2020-04-08 MED ORDER — ESCITALOPRAM OXALATE 20 MG PO TABS: 20.0000 mg | ORAL_TABLET | Freq: Every day | ORAL | 1 refills | Status: DC

## 2020-04-08 MED ORDER — LISINOPRIL-HYDROCHLOROTHIAZIDE 20-25 MG PO TABS: 1.0000 | ORAL_TABLET | Freq: Every day | ORAL | 1 refills | Status: DC

## 2020-04-08 MED ORDER — AMLODIPINE BESYLATE 10 MG PO TABS: 10.0000 mg | ORAL_TABLET | Freq: Every day | ORAL | 1 refills | Status: DC

## 2020-04-08 NOTE — Progress Notes (Signed)
I,[name] as a Neurosurgeon for [employer], FNP.,have documented all relevant documentation on the behalf of [name], FNP,as directed by  [name], FNP while in the presence of [name], FNP. This visit occurred during the SARS-CoV-2 public health emergency.  Safety protocols were in place, including screening questions prior to the visit, additional usage of staff PPE, and extensive cleaning of exam room while observing appropriate contact time as indicated for disinfecting solutions.  Subjective:     Patient ID: Steven Gibson , male    DOB: 1971/04/05 , 49 y.o.   MRN: [id number]   Chief Complaint  Patient presents with  . Pre-op Exam    HPI  Here for pre op clearance for left knee replacement. Denies any problems with chest pain or shortness of breath.  He is potentially scheduled for early December with Del[facility] Har[facility].   He is having left knee pain Hypertension This is a chronic problem. The current episode started more than 1 year ago. The problem is unchanged. The problem is controlled. Pertinent negatives include no anxiety, chest pain, headaches or palpitations. Risk factors for coronary artery disease include male gender, obesity and sedentary lifestyle. Past treatments include angiotensin blockers, diuretics and calcium channel blockers. There are no compliance problems.  There is no history of angina. There is no history of chronic renal disease.  Hyperlipidemia This is a chronic problem. The current episode started more than 1 year ago. The problem is controlled. Recent lipid tests were reviewed and are normal. He has no history of chronic renal disease. Pertinent negatives include no chest pain. There are no compliance problems.  Risk factors for coronary artery disease include male sex and obesity.     Past Medical History:  Diagnosis Date  . Graves disease      Family History  Problem Relation Age of Onset  . Hypertension Mother   .  Hypertension Father   . Hypertension Brother   . Cancer Paternal Grandfather   . Hypertension Brother      Current Outpatient Medications:  .  amLODipine (NORVASC) 10 MG tablet, Take 1 tablet (10 mg total) by mouth daily., Disp: 90 tablet, Rfl: 1 .  amphetamine-dextroamphetamine (ADDERALL) 15 MG tablet, Take 1 tablet by mouth 4 (four) times daily., Disp: 120 tablet, Rfl: 0 .  aspirin EC 81 MG tablet, Take by mouth. , Disp: , Rfl:  .  clobetasol ointment (TEMOVATE) 0.05 %, Apply 1 application topically 2 (two) times daily., Disp: 30 g, Rfl: 2 .  escitalopram (LEXAPRO) 20 MG tablet, Take 1 tablet (20 mg total) by mouth daily., Disp: 90 tablet, Rfl: 1 .  lisinopril-hydrochlorothiazide (ZESTORETIC) 20-25 MG tablet, Take 1 tablet by mouth daily., Disp: 90 tablet, Rfl: 1 .  meloxicam (MOBIC) 15 MG tablet, Take 15 mg by mouth daily., Disp: , Rfl:  .  pravastatin (PRAVACHOL) 20 MG tablet, Take 1 tablet (20 mg total) by mouth daily., Disp: 90 tablet, Rfl: 1   No Known Allergies   Review of Systems  Cardiovascular: Negative for chest pain and palpitations.  Neurological: Negative for headaches.     Today's Vitals   04/08/20 1035  BP: 132/80  Pulse: 92  Temp: 98.4 F (36.9 C)  TempSrc: Oral  Weight: 221 lb 9.6 oz (100.5 kg)  Height: 6' 0.4" (1.839 m)  PainSc: 8   PainLoc: Knee   Body mass index is 29.72 kg/m.   Objective:  Physical Exam Vitals reviewed.  Constitutional:  General: He is not in acute distress.    Appearance: Normal appearance.  Cardiovascular:     Rate and Rhythm: Normal rate and regular rhythm.     Pulses: Normal pulses.     Heart sounds: Normal heart sounds. No murmur heard.   Pulmonary:     Effort: Pulmonary effort is normal. No respiratory distress.     Breath sounds: Normal breath sounds. No wheezing.  Neurological:     General: No focal deficit present.     Mental Status: He is alert and oriented to person, place, and time.     Cranial Nerves: No  cranial nerve deficit.     Motor: No weakness.  Psychiatric:        Mood and Affect: Mood normal.        Behavior: Behavior normal.        Thought Content: Thought content normal.        Judgment: Judgment normal.         Assessment And Plan:     1. Pre-op exam  He is planning to have a knee surgery to his left knee  EKG done NSR - EKG 12-Lead - CBC no Diff  2. Mixed hyperlipidemia  Chronic, stable  Continue with current medications  Will recheck lipid panel - Lipid panel  3. Essential hypertension . B/P is controlled.  . CMP ordered to check renal function.  . The importance of regular exercise and dietary modification was stressed to the patient.  - amLODipine (NORVASC) 10 MG tablet; Take 1 tablet (10 mg total) by mouth daily.  Dispense: 90 tablet; Refill: 1 - lisinopril-hydrochlorothiazide (ZESTORETIC) 20-25 MG tablet; Take 1 tablet by mouth daily.  Dispense: 90 tablet; Refill: 1  4. Chronic pain of left knee  He has not scheduled his surgery at this time.    5. Attention deficit disorder, unspecified hyperactivity presence  Chronic, good control  Continue with current medications - amphetamine-dextroamphetamine (ADDERALL) 15 MG tablet; Take 1 tablet by mouth 4 (four) times daily.  Dispense: 120 tablet; Refill: 0 - escitalopram (LEXAPRO) 20 MG tablet; Take 1 tablet (20 mg total) by mouth daily.  Dispense: 90 tablet; Refill: 1  6. Abnormal glucose  Chronic, controlled  Continue with current medications  Encouraged to limit intake of sugary foods and drinks  Encouraged to increase physical activity to 150 minutes per week - Hemoglobin A1c    Patient was given opportunity to ask questions. Patient verbalized understanding of the plan and was able to repeat key elements of the plan. All questions were answered to their satisfaction.    [name], FNP, have reviewed all documentation for this visit. The documentation on 04/13/20 for the exam,  diagnosis, procedures, and orders are all accurate and complete.   THE PATIENT IS ENCOURAGED TO PRACTICE SOCIAL DISTANCING DUE TO THE COVID-19 PANDEMIC.

## 2020-04-09 ENCOUNTER — Other Ambulatory Visit: Payer: Self-pay | Admitting: Nurse Practitioner

## 2020-04-09 ENCOUNTER — Telehealth: Payer: Self-pay | Admitting: Nurse Practitioner

## 2020-04-09 DIAGNOSIS — E782 Mixed hyperlipidemia: Secondary | ICD-10-CM

## 2020-04-09 DIAGNOSIS — E781 Pure hyperglyceridemia: Secondary | ICD-10-CM

## 2020-04-09 LAB — HEMOGLOBIN A1C
Est. average glucose Bld gHb Est-mCnc: 123 mg/dL
Hgb A1c MFr Bld: 5.9 % — ABNORMAL HIGH (ref 4.8–5.6)

## 2020-04-09 LAB — CBC
Hematocrit: 45.4 % (ref 37.5–51.0)
Hemoglobin: 15.6 g/dL (ref 13.0–17.7)
MCH: 29 pg (ref 26.6–33.0)
MCHC: 34.4 g/dL (ref 31.5–35.7)
MCV: 84 fL (ref 79–97)
Platelets: 336 10*3/uL (ref 150–450)
RBC: 5.38 x10E6/uL (ref 4.14–5.80)
RDW: 13.5 % (ref 11.6–15.4)
WBC: 6.4 10*3/uL (ref 3.4–10.8)

## 2020-04-09 LAB — LIPID PANEL
Chol/HDL Ratio: 5.3 ratio — ABNORMAL HIGH (ref 0.0–5.0)
Cholesterol, Total: 213 mg/dL — ABNORMAL HIGH (ref 100–199)
HDL: 40 mg/dL (ref >39)
LDL Chol Calc (NIH): 59 mg/dL (ref 0–99)
Triglycerides: 767 mg/dL (ref 0–149)
VLDL Cholesterol Cal: 114 mg/dL — ABNORMAL HIGH (ref 5–40)

## 2020-04-09 MED ORDER — PRAVASTATIN SODIUM 80 MG PO TABS: 80.0000 mg | ORAL_TABLET | Freq: Every day | ORAL | 1 refills | Status: DC

## 2020-04-09 MED ORDER — PRAVASTATIN SODIUM 20 MG PO TABS: 20.0000 mg | ORAL_TABLET | Freq: Every day | ORAL | 1 refills | Status: DC

## 2020-04-09 MED ORDER — ICOSAPENT ETHYL 1 G PO CAPS: 2.0000 g | ORAL_CAPSULE | Freq: Two times a day (BID) | ORAL | 1 refills | Status: DC

## 2020-04-09 NOTE — Telephone Encounter (Signed)
Called patient to inquire on what he ate prior to his office visit and lab work. He reports he had eaten cupcakes, shrimp and grits the night before at 1-2 am and an energy drink. I will have him to return on Monday morning fasting to repeat and make sure if his triglycerides are still elevated. At this time he is to continue his current dose of pravastatin and does not need to pick up the vascepa.

## 2020-04-13 ENCOUNTER — Encounter: Payer: Self-pay | Admitting: Nurse Practitioner

## 2020-04-14 ENCOUNTER — Other Ambulatory Visit: Payer: Self-pay

## 2020-04-14 DIAGNOSIS — E782 Mixed hyperlipidemia: Secondary | ICD-10-CM

## 2020-04-15 ENCOUNTER — Other Ambulatory Visit: Payer: Self-pay | Admitting: Nurse Practitioner

## 2020-04-15 DIAGNOSIS — E781 Pure hyperglyceridemia: Secondary | ICD-10-CM

## 2020-04-15 LAB — LIPID PANEL
Chol/HDL Ratio: 6.1 ratio — ABNORMAL HIGH (ref 0.0–5.0)
Cholesterol, Total: 230 mg/dL — ABNORMAL HIGH (ref 100–199)
HDL: 38 mg/dL — ABNORMAL LOW (ref >39)
Triglycerides: 1034 mg/dL (ref 0–149)

## 2020-04-15 MED ORDER — ICOSAPENT ETHYL 1 G PO CAPS: 2.0000 g | ORAL_CAPSULE | Freq: Two times a day (BID) | ORAL | 1 refills | Status: DC

## 2020-04-16 ENCOUNTER — Other Ambulatory Visit: Payer: Self-pay | Admitting: Internal Medicine

## 2020-04-16 ENCOUNTER — Other Ambulatory Visit: Payer: Self-pay

## 2020-04-16 DIAGNOSIS — E781 Pure hyperglyceridemia: Secondary | ICD-10-CM

## 2020-04-21 ENCOUNTER — Other Ambulatory Visit: Payer: Self-pay | Admitting: Internal Medicine

## 2020-04-22 ENCOUNTER — Encounter: Payer: Self-pay | Admitting: Nurse Practitioner

## 2020-04-22 LAB — TSH: TSH: 1.39 u[IU]/mL (ref 0.450–4.500)

## 2020-04-23 NOTE — Patient Instructions (Addendum)
DUE TO COVID-19 ONLY ONE VISITOR IS ALLOWED TO COME WITH YOU AND STAY IN THE WAITING ROOM ONLY DURING PRE OP AND PROCEDURE DAY OF SURGERY. THE 1 VISITOR  MAY VISIT WITH YOU AFTER SURGERY IN YOUR PRIVATE ROOM DURING VISITING HOURS ONLY!  YOU NEED TO HAVE A COVID 19 TEST ON_12-17-21______ @_______ , THIS TEST MUST BE DONE BEFORE SURGERY,  COVID TESTING SITE [address] [place] , IT IS ON THE RIGHT GOING OUT [address] APPROXIMATELY  2 MINUTES PAST [employer] ON THE RIGHT. ONCE YOUR COVID TEST IS COMPLETED,  PLEASE BEGIN THE QUARANTINE INSTRUCTIONS AS OUTLINED IN YOUR HANDOUT.                [id number]    Your procedure is scheduled on: 05-06-20   [facility]  Entrance   Report to admitting at     0820 AM     Call this number if you have problems the morning of surgery [phone]    Remember: NO SOLID FOOD AFTER MIDNIGHT THE NIGHT PRIOR TO SURGERY. NOTHING BY MOUTH EXCEPT CLEAR LIQUIDS   UNTIL  0750 am  . PLEASE FINISH ENSURE DRINK PER SURGEON ORDER  WHICH NEEDS TO BE COMPLETED AT       0750 am then nothing by   mouth .     CLEAR LIQUID DIET   Foods Allowed                                                                       Black Coffee and tea, regular and decaf                              Plain Jell-O any favor except red or purple                                            Fruit ices (not with fruit pulp)                                      Iced Popsicles                                     Carbonated beverages, regular and diet                                    Cranberry, grape and apple juices Sports drinks like Gatorade Lightly seasoned clear broth or consume(fat free) Sugar, honey syrup  _____________________________________________________________________     BRUSH YOUR TEETH MORNING OF SURGERY AND RINSE YOUR MOUTH OUT, NO CHEWING GUM CANDY OR MINTS.     Take these medicines the morning  of surgery with A SIP OF WATER: prevastatin, lexapro, amlodipine  DO NOT TAKE ANY DIABETIC MEDICATIONS DAY OF YOUR SURGERY  You may not have any metal on your body including hair pins and              piercings  Do not wear jewelry,, lotions, powders or perfumes, deodorant                       Men may shave face and neck.   Do not bring valuables to the hospital. [facility] IS NOT             RESPONSIBLE   FOR VALUABLES.  Contacts, dentures or bridgework may not be worn into surgery.       Patients discharged the day of surgery will not be allowed to drive home. IF YOU ARE HAVING SURGERY AND GOING HOME THE SAME DAY, YOU MUST HAVE AN ADULT TO DRIVE YOU HOME AND BE WITH YOU FOR 24 HOURS. YOU MAY GO HOME BY TAXI OR UBER OR ORTHERWISE, BUT AN ADULT MUST ACCOMPANY YOU HOME AND STAY WITH YOU FOR 24 HOURS.  Name and phone number of your driver:  Special Instructions: N/A              Please read over the following fact sheets you were given: _____________________________________________________________________             [facility] - Preparing for Surgery Before surgery, you can play an important role.  Because skin is not sterile, your skin needs to be as free of germs as possible.  You can reduce the number of germs on your skin by washing with CHG (chlorahexidine gluconate) soap before surgery.  CHG is an antiseptic cleaner which kills germs and bonds with the skin to continue killing germs even after washing. Please DO NOT use if you have an allergy to CHG or antibacterial soaps.  If your skin becomes reddened/irritated stop using the CHG and inform your nurse when you arrive at Short Stay. Do not shave (including legs and underarms) for at least 48 hours prior to the first CHG shower.  You may shave your face/neck. Please follow these instructions carefully:  1.  Shower with CHG Soap the night before surgery and the  morning of Surgery.  2.  If you choose  to wash your hair, wash your hair first as usual with your  normal  shampoo.  3.  After you shampoo, rinse your hair and body thoroughly to remove the  shampoo.                           4.  Use CHG as you would any other liquid soap.  You can apply chg directly  to the skin and wash                       Gently with a scrungie or clean washcloth.  5.  Apply the CHG Soap to your body ONLY FROM THE NECK DOWN.   Do not use on face/ open                           Wound or open sores. Avoid contact with eyes, ears mouth and genitals (private parts).                       Wash face,  Genitals (private parts) with your normal soap.  6.  Wash thoroughly, paying special attention to the area where your surgery  will be performed.  7.  Thoroughly rinse your body with warm water from the neck down.  8.  DO NOT shower/wash with your normal soap after using and rinsing off  the CHG Soap.                9.  Pat yourself dry with a clean towel.            10.  Wear clean pajamas.            11.  Place clean sheets on your bed the night of your first shower and do not  sleep with pets. Day of Surgery : Do not apply any lotions/deodorants the morning of surgery.  Please wear clean clothes to the hospital/surgery center.  FAILURE TO FOLLOW THESE INSTRUCTIONS MAY RESULT IN THE CANCELLATION OF YOUR SURGERY PATIENT SIGNATURE_________________________________  NURSE SIGNATURE__________________________________  ________________________________________________________________________   Steven Gibson  An incentive spirometer is a tool that can help keep your lungs clear and active. This tool measures how well you are filling your lungs with each breath. Taking long deep breaths may help reverse or decrease the chance of developing breathing (pulmonary) problems (especially infection) following:  A long period of time when you are unable to move or be active. BEFORE THE PROCEDURE   If the  spirometer includes an indicator to show your best effort, your nurse or respiratory therapist will set it to a desired goal.  If possible, sit up straight or lean slightly forward. Try not to slouch.  Hold the incentive spirometer in an upright position. INSTRUCTIONS FOR USE  1. Sit on the edge of your bed if possible, or sit up as far as you can in bed or on a chair. 2. Hold the incentive spirometer in an upright position. 3. Breathe out normally. 4. Place the mouthpiece in your mouth and seal your lips tightly around it. 5. Breathe in slowly and as deeply as possible, raising the piston or the ball toward the top of the column. 6. Hold your breath for 3-5 seconds or for as long as possible. Allow the piston or ball to fall to the bottom of the column. 7. Remove the mouthpiece from your mouth and breathe out normally. 8. Rest for a few seconds and repeat Steps 1 through 7 at least 10 times every 1-2 hours when you are awake. Take your time and take a few normal breaths between deep breaths. 9. The spirometer may include an indicator to show your best effort. Use the indicator as a goal to work toward during each repetition. 10. After each set of 10 deep breaths, practice coughing to be sure your lungs are clear. If you have an incision (the cut made at the time of surgery), support your incision when coughing by placing a pillow or rolled up towels firmly against it. Once you are able to get out of bed, walk around indoors and cough well. You may stop using the incentive spirometer when instructed by your caregiver.  RISKS AND COMPLICATIONS  Take your time so you do not get dizzy or light-headed.  If you are in pain, you may need to take or ask for pain medication before doing incentive spirometry. It is harder to take a deep breath if you are having pain. AFTER USE  Rest and breathe slowly and easily.  It can be helpful to keep track of a log of your  progress. Your caregiver can provide  you with a simple table to help with this. If you are using the spirometer at home, follow these instructions: SEEK MEDICAL CARE IF:   You are having difficultly using the spirometer.  You have trouble using the spirometer as often as instructed.  Your pain medication is not giving enough relief while using the spirometer.  You develop fever of 100.5 F (38.1 C) or higher. SEEK IMMEDIATE MEDICAL CARE IF:   You cough up bloody sputum that had not been present before.  You develop fever of 102 F (38.9 C) or greater.  You develop worsening pain at or near the incision site. MAKE SURE YOU:   Understand these instructions.  Will watch your condition.  Will get help right away if you are not doing well or get worse. Document Released: 09/13/2006 Document Revised: 07/26/2011 Document Reviewed: 11/14/2006 [facility] Patient Information 2014 [place], [place].

## 2020-04-23 NOTE — Progress Notes (Signed)
Please place orders in epic pt. Is scheduled for preop exam

## 2020-04-23 NOTE — Progress Notes (Addendum)
PCP - [name], FNP preop eval 04-08-20 epic Cardiologist - no  PPM/ICD -  Device Orders -  Rep Notified -   Chest x-ray -  EKG - 04-16-20 epic and 07-19-19 epic Stress Test -  ECHO - 2017 [facility] Cardiac Cath -   Sleep Study -  CPAP -   Fasting Blood Sugar -  Checks Blood Sugar _____ times a day  Blood Thinner Instructions: Aspirin Instructions:  ERAS Protcol - PRE-SURGERY Ensure   COVID TEST- 12-17  Activity- Able to do own yard work and walk a flight of stairs without SOB Anesthesia review: HTN, Graves Disease  Patient denies shortness of breath, fever, cough and chest pain at PAT appointment  NONE   All instructions explained to the patient, with a verbal understanding of the material. Patient agrees to go over the instructions while at home for a better understanding. Patient also instructed to self quarantine after being tested for COVID-19. The opportunity to ask questions was provided.

## 2020-04-30 ENCOUNTER — Encounter (HOSPITAL_COMMUNITY)
Admission: RE | Admit: 2020-04-30 | Discharge: 2020-04-30 | Disposition: A | Discharge status: Home / Self Care | Payer: Managed Care, Other (non HMO) | Source: Ambulatory Visit | Attending: Orthopedic Surgery | Admitting: Orthopedic Surgery

## 2020-04-30 ENCOUNTER — Encounter (HOSPITAL_COMMUNITY): Payer: Self-pay

## 2020-04-30 ENCOUNTER — Other Ambulatory Visit: Payer: Self-pay

## 2020-04-30 DIAGNOSIS — Z01812 Encounter for preprocedural laboratory examination: Secondary | ICD-10-CM | POA: Diagnosis present

## 2020-04-30 HISTORY — DX: Essential (primary) hypertension: I10

## 2020-04-30 HISTORY — DX: Unspecified osteoarthritis, unspecified site: M19.90

## 2020-04-30 HISTORY — DX: Hyperlipidemia, unspecified: E78.5

## 2020-04-30 LAB — CBC
HCT: 45.5 % (ref 39.0–52.0)
Hemoglobin: 14.9 g/dL (ref 13.0–17.0)
MCH: 28.1 pg (ref 26.0–34.0)
MCHC: 32.7 g/dL (ref 30.0–36.0)
MCV: 85.8 fL (ref 80.0–100.0)
Platelets: 326 10*3/uL (ref 150–400)
RBC: 5.3 MIL/uL (ref 4.22–5.81)
RDW: 13.5 % (ref 11.5–15.5)
WBC: 6.4 10*3/uL (ref 4.0–10.5)
nRBC: 0 % (ref 0.0–0.2)

## 2020-04-30 LAB — BASIC METABOLIC PANEL
Anion gap: 9 (ref 5–15)
BUN: 17 mg/dL (ref 6–20)
CO2: 28 mmol/L (ref 22–32)
Calcium: 9.5 mg/dL (ref 8.9–10.3)
Chloride: 104 mmol/L (ref 98–111)
Creatinine, Ser: 0.99 mg/dL (ref 0.61–1.24)
GFR, Estimated: >60 mL/min (ref >60)
Glucose, Bld: 111 mg/dL — ABNORMAL HIGH (ref 70–99)
Potassium: 3.9 mmol/L (ref 3.5–5.1)
Sodium: 141 mmol/L (ref 135–145)

## 2020-04-30 LAB — SURGICAL PCR SCREEN
MRSA, PCR: NEGATIVE
Staphylococcus aureus: NEGATIVE

## 2020-05-01 ENCOUNTER — Telehealth: Payer: Self-pay

## 2020-05-01 NOTE — Telephone Encounter (Signed)
LVM for pt to pick up samples of Vascepa they will be upfront

## 2020-05-02 ENCOUNTER — Other Ambulatory Visit (HOSPITAL_COMMUNITY)
Admission: RE | Admit: 2020-05-02 | Discharge: 2020-05-02 | Disposition: A | Discharge status: Home / Self Care | Payer: Managed Care, Other (non HMO) | Source: Ambulatory Visit | Attending: Orthopedic Surgery | Admitting: Orthopedic Surgery

## 2020-05-02 DIAGNOSIS — Z01812 Encounter for preprocedural laboratory examination: Secondary | ICD-10-CM | POA: Diagnosis present

## 2020-05-02 DIAGNOSIS — Z20822 Contact with and (suspected) exposure to covid-19: Secondary | ICD-10-CM | POA: Insufficient documentation

## 2020-05-03 LAB — SARS CORONAVIRUS 2 (TAT 6-24 HRS): SARS Coronavirus 2: NEGATIVE

## 2020-05-05 NOTE — H&P (Signed)
KNEE ARTHROPLASTY ADMISSION H&P  Patient ID: Marco Raper Gibson MRN: [id number] DOB/AGE: December 01, 1970 49 y.o.  Chief Complaint: left knee pain.  Planned Procedure Date: 05/06/20 Medical Clearance by Penelope Galas FNP   HPI: Steven Gibson is a 49 y.o. male who presents for evaluation of djd left knee. The patient has a history of pain and functional disability in the left knee due to arthritis and has failed non-surgical conservative treatments for greater than 12 weeks to include NSAID's and/or analgesics, corticosteriod injections, use of assistive devices and activity modification.  Onset of symptoms was gradual, starting >10 years ago with gradually worsening course since that time. The patient noted prior procedures on the knee to include  arthroscopy, ACL reconstruction and MCL reconstruction, menisectomy on the left knee.  Patient currently rates pain at 6 out of 10 with activity. Patient has worsening of pain with activity and weight bearing, pain that interferes with activities of daily living and crepitus.  Patient has evidence of joint space narrowing by imaging studies.  There is no active infection.  Past Medical History:  Diagnosis Date  . Arthritis   . Graves disease   . Hyperlipidemia   . Hypertension    Past Surgical History:  Procedure Laterality Date  . ANTERIOR CRUCIATE LIGAMENT REPAIR     PCL and MCL  . HERNIA REPAIR N/A    adb   No Known Allergies Prior to Admission medications   Medication Sig Start Date End Date Taking? Authorizing Provider  amLODipine (NORVASC) 10 MG tablet Take 1 tablet (10 mg total) by mouth daily. 04/08/20  Yes [name], FNP  amphetamine-dextroamphetamine (ADDERALL) 15 MG tablet Take 1 tablet by mouth 4 (four) times daily. 04/08/20  Yes [name], FNP  escitalopram (LEXAPRO) 20 MG tablet Take 1 tablet (20 mg total) by mouth daily. 04/08/20  Yes [name], FNP  lisinopril-hydrochlorothiazide (ZESTORETIC) 20-25 MG  tablet Take 1 tablet by mouth daily. 04/08/20  Yes [name], FNP  pravastatin (PRAVACHOL) 20 MG tablet Take 1 tablet (20 mg total) by mouth daily. 04/09/20  Yes [name], FNP  icosapent Ethyl (VASCEPA) 1 g capsule Take 2 capsules (2 g total) by mouth 2 (two) times daily. 04/15/20   [name], FNP  meloxicam (MOBIC) 15 MG tablet Take 15 mg by mouth daily.    [provider]   Social History   Socioeconomic History  . Marital status: Married    Spouse name: Not on file  . Number of children: Not on file  . Years of education: Not on file  . Highest education level: Not on file  Occupational History  . Not on file  Tobacco Use  . Smoking status: Current Some Day Smoker    Packs/day: 0.25    Years: 20.00    Pack years: 5.00    Types: Cigarettes  . Smokeless tobacco: Former Neurosurgeon  . Tobacco comment: 6 cigarette a day  Vaping Use  . Vaping Use: Never used  Substance and Sexual Activity  . Alcohol use: Yes    Comment: occasional  . Drug use: Never  . Sexual activity: Not Currently  Other Topics Concern  . Not on file  Social History Narrative  . Not on file   Social Determinants of Health   Financial Resource Strain: Not on file  Food Insecurity: Not on file  Transportation Needs: Not on file  Physical Activity: Not on file  Stress: Not on file  Social Connections: Not on file  Family History  Problem Relation Age of Onset  . Hypertension Mother   . Hypertension Father   . Hypertension Brother   . Cancer Paternal Grandfather   . Hypertension Brother     ROS: Currently denies lightheadedness, dizziness, Fever, chills, CP, SOB.   No personal history of DVT, PE, MI, or CVA. He does state has has a few loose teeth. All other systems have been reviewed and were otherwise currently negative with the exception of those mentioned in the HPI and as above.  Objective: Vitals: Ht: 6'1.5" Wt: 221 lbs Temp: 98.92F BP: 135/92 Pulse: 83 bpm O2 99 % on room  air.   Physical Exam: General: Alert, NAD.  Antalgic Gait  HEENT: EOMI, Good Neck Extension  Pulm: No increased work of breathing.  Clear B/L A/P w/o crackle or wheeze.  CV: RRR, No m/g/r appreciated GI: soft, NT, ND Neuro: Neuro without gross focal deficit.  Sensation intact distally Skin: Keloid scars from previous left knee surgeries MSK/Surgical Site: knee w/o redness or effusion.  No JLT. ROM 0-90.  5/5 strength in extension and flexion.  +EHL/FHL.  NVI.  Stable varus and valgus stress.    Imaging Review Plain radiographs demonstrate severe posttraumatic arthosis of the left knee.   The overall alignment issignificant varus. The bone quality appears to be adequate for age and reported activity level.  Preoperative templating of the joint replacement has been completed, documented, and submitted to the Operating Room personnel in order to optimize intra-operative equipment management.  Assessment: Posttraumatic arthrosis left knee  Plan: Plan for Procedure(s): TOTAL KNEE ARTHROPLASTY  The patient history, physical exam, clinical judgement of the provider and imaging are consistent with end stage degenerative joint disease and left knee joint arthroplasty is deemed medically necessary. The treatment options including medical management, injection therapy, and arthroplasty were discussed at length. The risks and benefits of Procedure(s): TOTAL KNEE ARTHROPLASTY were presented and reviewed.  The risks of nonoperative treatment, versus surgical intervention including but not limited to continued pain, aseptic loosening, stiffness, dislocation/subluxation, infection, bleeding, nerve injury, blood clots, cardiopulmonary complications, morbidity, mortality, among others were discussed. The patient verbalizes understanding and wishes to proceed with the plan.  Patient is being admitted for inpatient treatment for surgery, pain control, PT, prophylactic antibiotics, VTE prophylaxis,  progressive ambulation, ADL's and discharge planning.   Dental prophylaxis discussed and recommended for 2 years postoperatively.   The patient does meet the criteria for TXA which will be used perioperatively.    ASA 325 mg  will be used postoperatively for DVT prophylaxis in addition to SCDs, and early ambulation.   The patient is planning to be discharged home with OPPT in care of family   Patient's anticipated LOS is less than 2 midnights, meeting these requirements: - Younger than 46 - Lives within 1 hour of care - Has a competent adult at home to recover with post-op recover - NO history of  - Chronic pain requiring opiods  - Diabetes  - Coronary Artery Disease  - Heart failure  - Heart attack  - Stroke  - DVT/VTE  - Cardiac arrhythmia  - Respiratory Failure/COPD  - Renal failure  - Anemia  - Advanced Liver disease        Armida Sans, PA-C 05/05/2020 5:35 PM

## 2020-05-06 ENCOUNTER — Observation Stay (HOSPITAL_COMMUNITY)
Admission: RE | Admit: 2020-05-06 | Discharge: 2020-05-07 | Disposition: A | Discharge status: Home / Self Care | Payer: Managed Care, Other (non HMO) | Attending: Orthopedic Surgery | Admitting: Orthopedic Surgery

## 2020-05-06 ENCOUNTER — Ambulatory Visit (HOSPITAL_COMMUNITY): Payer: Managed Care, Other (non HMO) | Admitting: Anesthesiology

## 2020-05-06 ENCOUNTER — Other Ambulatory Visit: Payer: Self-pay

## 2020-05-06 ENCOUNTER — Observation Stay (HOSPITAL_COMMUNITY): Payer: Managed Care, Other (non HMO)

## 2020-05-06 ENCOUNTER — Encounter (HOSPITAL_COMMUNITY): Payer: Self-pay | Admitting: Orthopedic Surgery

## 2020-05-06 ENCOUNTER — Encounter (HOSPITAL_COMMUNITY)
Admission: RE | Disposition: A | Discharge status: Home / Self Care | Payer: Self-pay | Source: Home / Self Care | Attending: Orthopedic Surgery

## 2020-05-06 DIAGNOSIS — I10 Essential (primary) hypertension: Secondary | ICD-10-CM | POA: Diagnosis not present

## 2020-05-06 DIAGNOSIS — Z79899 Other long term (current) drug therapy: Secondary | ICD-10-CM | POA: Insufficient documentation

## 2020-05-06 DIAGNOSIS — F17210 Nicotine dependence, cigarettes, uncomplicated: Secondary | ICD-10-CM | POA: Diagnosis not present

## 2020-05-06 DIAGNOSIS — M1732 Unilateral post-traumatic osteoarthritis, left knee: Principal | ICD-10-CM | POA: Insufficient documentation

## 2020-05-06 DIAGNOSIS — Z96652 Presence of left artificial knee joint: Secondary | ICD-10-CM

## 2020-05-06 DIAGNOSIS — M1712 Unilateral primary osteoarthritis, left knee: Secondary | ICD-10-CM | POA: Diagnosis present

## 2020-05-06 HISTORY — PX: TOTAL KNEE ARTHROPLASTY: SHX125

## 2020-05-06 SURGERY — ARTHROPLASTY, KNEE, TOTAL
Anesthesia: Regional | Site: Knee | Laterality: Left

## 2020-05-06 MED ORDER — DEXAMETHASONE SODIUM PHOSPHATE 10 MG/ML IJ SOLN
INTRAMUSCULAR | Status: DC | PRN
  Administered 2020-05-06: 5 mg

## 2020-05-06 MED ORDER — PROPOFOL 10 MG/ML IV BOLUS
INTRAVENOUS | Status: DC | PRN
  Administered 2020-05-06: 30 mg via INTRAVENOUS
  Administered 2020-05-06 (×2): 20 mg via INTRAVENOUS
  Administered 2020-05-06: 50 mg via INTRAVENOUS
  Administered 2020-05-06: 230 mg via INTRAVENOUS
  Administered 2020-05-06: 50 mg via INTRAVENOUS

## 2020-05-06 MED ORDER — PRAVASTATIN SODIUM 20 MG PO TABS: 20.0000 mg | ORAL_TABLET | Freq: Every day | ORAL | Status: DC

## 2020-05-06 MED ORDER — LACTATED RINGERS IV BOLUS: 500.0000 mL | Freq: Once | INTRAVENOUS | Status: DC

## 2020-05-06 MED ORDER — LISINOPRIL-HYDROCHLOROTHIAZIDE 20-25 MG PO TABS: 1.0000 | ORAL_TABLET | Freq: Every day | ORAL | Status: DC

## 2020-05-06 MED ORDER — EPHEDRINE SULFATE-NACL 50-0.9 MG/10ML-% IV SOSY
PREFILLED_SYRINGE | INTRAVENOUS | Status: DC | PRN
  Administered 2020-05-06: 15 mg via INTRAVENOUS
  Administered 2020-05-06: 10 mg via INTRAVENOUS

## 2020-05-06 MED ORDER — ROPIVACAINE HCL 7.5 MG/ML IJ SOLN
INTRAMUSCULAR | Status: DC | PRN
  Administered 2020-05-06: 20 mL via PERINEURAL

## 2020-05-06 MED ORDER — ORAL CARE MOUTH RINSE: 15.0000 mL | Freq: Once | OROMUCOSAL | Status: AC

## 2020-05-06 MED ORDER — LACTATED RINGERS IV SOLN: INTRAVENOUS | Status: DC

## 2020-05-06 MED ORDER — PROPOFOL 1000 MG/100ML IV EMUL
INTRAVENOUS | Status: AC
  Filled 2020-05-06: qty 100

## 2020-05-06 MED ORDER — PHENYLEPHRINE HCL (PRESSORS) 10 MG/ML IV SOLN
INTRAVENOUS | Status: AC
  Filled 2020-05-06: qty 1

## 2020-05-06 MED ORDER — KETAMINE HCL 10 MG/ML IJ SOLN
INTRAMUSCULAR | Status: AC
  Filled 2020-05-06: qty 1

## 2020-05-06 MED ORDER — CEFAZOLIN SODIUM-DEXTROSE 2-4 GM/100ML-% IV SOLN
2.0000 g | INTRAVENOUS | Status: AC
  Administered 2020-05-06: 2 g via INTRAVENOUS
  Filled 2020-05-06: qty 100

## 2020-05-06 MED ORDER — SODIUM CHLORIDE 0.9 % IR SOLN
Status: DC | PRN
  Administered 2020-05-06: 1000 mL

## 2020-05-06 MED ORDER — HYDROMORPHONE HCL 2 MG/ML IJ SOLN
INTRAMUSCULAR | Status: AC
  Filled 2020-05-06: qty 1

## 2020-05-06 MED ORDER — OXYCODONE HCL 5 MG PO TABS: 5.0000 mg | ORAL_TABLET | ORAL | 0 refills | Status: DC | PRN

## 2020-05-06 MED ORDER — BUPIVACAINE HCL 0.25 % IJ SOLN
INTRAMUSCULAR | Status: DC | PRN
  Administered 2020-05-06: 30 mL

## 2020-05-06 MED ORDER — ALUM & MAG HYDROXIDE-SIMETH 200-200-20 MG/5ML PO SUSP: 30.0000 mL | ORAL | Status: DC | PRN

## 2020-05-06 MED ORDER — ONDANSETRON HCL 4 MG/2ML IJ SOLN: 4.0000 mg | Freq: Four times a day (QID) | INTRAMUSCULAR | Status: DC | PRN

## 2020-05-06 MED ORDER — DEXMEDETOMIDINE (PRECEDEX) IN NS 20 MCG/5ML (4 MCG/ML) IV SYRINGE
PREFILLED_SYRINGE | INTRAVENOUS | Status: AC
  Filled 2020-05-06: qty 5

## 2020-05-06 MED ORDER — PHENOL 1.4 % MT LIQD: 1.0000 | OROMUCOSAL | Status: DC | PRN

## 2020-05-06 MED ORDER — CEFAZOLIN SODIUM-DEXTROSE 2-4 GM/100ML-% IV SOLN
2.0000 g | Freq: Four times a day (QID) | INTRAVENOUS | Status: AC
  Administered 2020-05-06 – 2020-05-07 (×2): 2 g via INTRAVENOUS
  Filled 2020-05-06 (×2): qty 100

## 2020-05-06 MED ORDER — OXYCODONE HCL 5 MG/5ML PO SOLN
5.0000 mg | Freq: Once | ORAL | Status: DC | PRN
Start: 2020-05-06 — End: 2020-05-06

## 2020-05-06 MED ORDER — DEXMEDETOMIDINE (PRECEDEX) IN NS 20 MCG/5ML (4 MCG/ML) IV SYRINGE
PREFILLED_SYRINGE | INTRAVENOUS | Status: DC | PRN
  Administered 2020-05-06: 4 ug via INTRAVENOUS
  Administered 2020-05-06 (×3): 8 ug via INTRAVENOUS

## 2020-05-06 MED ORDER — TRANEXAMIC ACID-NACL 1000-0.7 MG/100ML-% IV SOLN
1000.0000 mg | INTRAVENOUS | Status: AC
  Administered 2020-05-06: 1000 mg via INTRAVENOUS
  Filled 2020-05-06: qty 100

## 2020-05-06 MED ORDER — POLYETHYLENE GLYCOL 3350 17 G PO PACK: 17.0000 g | PACK | Freq: Every day | ORAL | Status: DC | PRN

## 2020-05-06 MED ORDER — TRANEXAMIC ACID-NACL 1000-0.7 MG/100ML-% IV SOLN
1000.0000 mg | Freq: Once | INTRAVENOUS | Status: AC
  Administered 2020-05-06: 1000 mg via INTRAVENOUS
  Filled 2020-05-06: qty 100

## 2020-05-06 MED ORDER — GLYCOPYRROLATE PF 0.2 MG/ML IJ SOSY
PREFILLED_SYRINGE | INTRAMUSCULAR | Status: AC
  Filled 2020-05-06: qty 1

## 2020-05-06 MED ORDER — ONDANSETRON HCL 4 MG PO TABS: 4.0000 mg | ORAL_TABLET | Freq: Three times a day (TID) | ORAL | 0 refills | Status: DC | PRN

## 2020-05-06 MED ORDER — ASPIRIN EC 325 MG PO TBEC
325.0000 mg | DELAYED_RELEASE_TABLET | Freq: Every day | ORAL | Status: DC
  Administered 2020-05-07: 325 mg via ORAL
  Filled 2020-05-06: qty 1

## 2020-05-06 MED ORDER — SENNA-DOCUSATE SODIUM 8.6-50 MG PO TABS: 2.0000 | ORAL_TABLET | Freq: Every day | ORAL | 1 refills | Status: DC

## 2020-05-06 MED ORDER — ESCITALOPRAM OXALATE 20 MG PO TABS
20.0000 mg | ORAL_TABLET | Freq: Every day | ORAL | Status: DC
  Administered 2020-05-07: 20 mg via ORAL
  Filled 2020-05-06: qty 1

## 2020-05-06 MED ORDER — 0.9 % SODIUM CHLORIDE (POUR BTL) OPTIME
TOPICAL | Status: DC | PRN
  Administered 2020-05-06: 1000 mL

## 2020-05-06 MED ORDER — METHOCARBAMOL 500 MG IVPB - SIMPLE MED
INTRAVENOUS | Status: AC
  Filled 2020-05-06: qty 50

## 2020-05-06 MED ORDER — KETOROLAC TROMETHAMINE 30 MG/ML IJ SOLN
INTRAMUSCULAR | Status: DC | PRN
  Administered 2020-05-06: 30 mg

## 2020-05-06 MED ORDER — GLYCOPYRROLATE PF 0.2 MG/ML IJ SOSY
PREFILLED_SYRINGE | INTRAMUSCULAR | Status: DC | PRN
  Administered 2020-05-06: .2 mg via INTRAVENOUS

## 2020-05-06 MED ORDER — FENTANYL CITRATE (PF) 250 MCG/5ML IJ SOLN
INTRAMUSCULAR | Status: AC
  Filled 2020-05-06: qty 5

## 2020-05-06 MED ORDER — EPHEDRINE 5 MG/ML INJ
INTRAVENOUS | Status: AC
  Filled 2020-05-06: qty 10

## 2020-05-06 MED ORDER — AMLODIPINE BESYLATE 10 MG PO TABS
10.0000 mg | ORAL_TABLET | Freq: Every day | ORAL | Status: DC
  Administered 2020-05-07: 10 mg via ORAL
  Filled 2020-05-06: qty 1

## 2020-05-06 MED ORDER — KETAMINE HCL 10 MG/ML IJ SOLN
INTRAMUSCULAR | Status: DC | PRN
  Administered 2020-05-06 (×3): 30 mg via INTRAVENOUS
  Administered 2020-05-06: 20 mg via INTRAVENOUS

## 2020-05-06 MED ORDER — DEXAMETHASONE SODIUM PHOSPHATE 10 MG/ML IJ SOLN
INTRAMUSCULAR | Status: AC
  Filled 2020-05-06: qty 1

## 2020-05-06 MED ORDER — ACETAMINOPHEN 500 MG PO TABS
1000.0000 mg | ORAL_TABLET | Freq: Once | ORAL | Status: AC
  Administered 2020-05-06: 1000 mg via ORAL
  Filled 2020-05-06: qty 2

## 2020-05-06 MED ORDER — ACETAMINOPHEN 325 MG PO TABS: 325.0000 mg | ORAL_TABLET | Freq: Four times a day (QID) | ORAL | Status: DC | PRN

## 2020-05-06 MED ORDER — FENTANYL CITRATE (PF) 100 MCG/2ML IJ SOLN: 50.0000 ug | INTRAMUSCULAR | Status: DC

## 2020-05-06 MED ORDER — AMPHETAMINE-DEXTROAMPHETAMINE 15 MG PO TABS: 15.0000 mg | ORAL_TABLET | Freq: Four times a day (QID) | ORAL | Status: DC

## 2020-05-06 MED ORDER — MIDAZOLAM HCL 2 MG/2ML IJ SOLN
INTRAMUSCULAR | Status: AC
  Administered 2020-05-06: 2 mg via INTRAVENOUS
  Filled 2020-05-06: qty 2

## 2020-05-06 MED ORDER — HYDROCHLOROTHIAZIDE 25 MG PO TABS
25.0000 mg | ORAL_TABLET | Freq: Every day | ORAL | Status: DC
  Administered 2020-05-06 – 2020-05-07 (×2): 25 mg via ORAL
  Filled 2020-05-06 (×2): qty 1

## 2020-05-06 MED ORDER — DEXAMETHASONE SODIUM PHOSPHATE 10 MG/ML IJ SOLN
10.0000 mg | Freq: Once | INTRAMUSCULAR | Status: DC
  Filled 2020-05-06: qty 1

## 2020-05-06 MED ORDER — WATER FOR IRRIGATION, STERILE IR SOLN
Status: DC | PRN
  Administered 2020-05-06: 1000 mL

## 2020-05-06 MED ORDER — MENTHOL 3 MG MT LOZG: 1.0000 | LOZENGE | OROMUCOSAL | Status: DC | PRN

## 2020-05-06 MED ORDER — LISINOPRIL 20 MG PO TABS
20.0000 mg | ORAL_TABLET | Freq: Every day | ORAL | Status: DC
  Administered 2020-05-06 – 2020-05-07 (×2): 20 mg via ORAL
  Filled 2020-05-06 (×2): qty 1

## 2020-05-06 MED ORDER — LACTATED RINGERS IV BOLUS: 250.0000 mL | Freq: Once | INTRAVENOUS | Status: DC

## 2020-05-06 MED ORDER — POTASSIUM CHLORIDE IN NACL 20-0.45 MEQ/L-% IV SOLN
INTRAVENOUS | Status: DC
  Filled 2020-05-06 (×2): qty 1000

## 2020-05-06 MED ORDER — FENTANYL CITRATE (PF) 100 MCG/2ML IJ SOLN
INTRAMUSCULAR | Status: AC
  Administered 2020-05-06: 100 ug via INTRAVENOUS
  Filled 2020-05-06: qty 2

## 2020-05-06 MED ORDER — ONDANSETRON HCL 4 MG/2ML IJ SOLN
INTRAMUSCULAR | Status: DC | PRN
  Administered 2020-05-06: 4 mg via INTRAVENOUS

## 2020-05-06 MED ORDER — POVIDONE-IODINE 10 % EX SWAB
2.0000 "application " | Freq: Once | CUTANEOUS | Status: AC
  Administered 2020-05-06: 2 via TOPICAL

## 2020-05-06 MED ORDER — MIDAZOLAM HCL 2 MG/2ML IJ SOLN: 1.0000 mg | INTRAMUSCULAR | Status: DC

## 2020-05-06 MED ORDER — OXYCODONE HCL 5 MG PO TABS
5.0000 mg | ORAL_TABLET | ORAL | Status: DC | PRN
Start: 2020-05-06 — End: 2020-05-07
  Administered 2020-05-06: 10 mg via ORAL
  Filled 2020-05-06 (×2): qty 2

## 2020-05-06 MED ORDER — MAGNESIUM CITRATE PO SOLN: 1.0000 | Freq: Once | ORAL | Status: DC | PRN

## 2020-05-06 MED ORDER — BISACODYL 10 MG RE SUPP: 10.0000 mg | Freq: Every day | RECTAL | Status: DC | PRN

## 2020-05-06 MED ORDER — SODIUM CHLORIDE (PF) 0.9 % IJ SOLN
INTRAMUSCULAR | Status: AC
  Filled 2020-05-06: qty 10

## 2020-05-06 MED ORDER — BACLOFEN 10 MG PO TABS: 10.0000 mg | ORAL_TABLET | Freq: Three times a day (TID) | ORAL | 0 refills | Status: DC

## 2020-05-06 MED ORDER — METOCLOPRAMIDE HCL 5 MG/ML IJ SOLN: 5.0000 mg | Freq: Three times a day (TID) | INTRAMUSCULAR | Status: DC | PRN

## 2020-05-06 MED ORDER — AMPHETAMINE-DEXTROAMPHETAMINE 5 MG PO TABS: 15.0000 mg | ORAL_TABLET | Freq: Four times a day (QID) | ORAL | Status: DC

## 2020-05-06 MED ORDER — METOCLOPRAMIDE HCL 5 MG PO TABS
5.0000 mg | ORAL_TABLET | Freq: Three times a day (TID) | ORAL | Status: DC | PRN
Start: 2020-05-06 — End: 2020-05-07

## 2020-05-06 MED ORDER — PHENYLEPHRINE HCL-NACL 10-0.9 MG/250ML-% IV SOLN
INTRAVENOUS | Status: DC | PRN
  Administered 2020-05-06: 50 ug/min via INTRAVENOUS

## 2020-05-06 MED ORDER — BUPIVACAINE HCL 0.25 % IJ SOLN
INTRAMUSCULAR | Status: AC
  Filled 2020-05-06: qty 1

## 2020-05-06 MED ORDER — HYDROMORPHONE HCL 1 MG/ML IJ SOLN
0.2500 mg | INTRAMUSCULAR | Status: DC | PRN
  Administered 2020-05-06: 0.5 mg via INTRAVENOUS

## 2020-05-06 MED ORDER — HYDROMORPHONE HCL 1 MG/ML IJ SOLN
INTRAMUSCULAR | Status: AC
  Administered 2020-05-06: 0.5 mg via INTRAVENOUS
  Filled 2020-05-06: qty 1

## 2020-05-06 MED ORDER — ONDANSETRON HCL 4 MG/2ML IJ SOLN
INTRAMUSCULAR | Status: AC
  Filled 2020-05-06: qty 2

## 2020-05-06 MED ORDER — KETOROLAC TROMETHAMINE 30 MG/ML IJ SOLN
INTRAMUSCULAR | Status: AC
  Filled 2020-05-06: qty 1

## 2020-05-06 MED ORDER — OXYCODONE HCL 5 MG PO TABS
10.0000 mg | ORAL_TABLET | ORAL | Status: DC | PRN
  Administered 2020-05-07: 15 mg via ORAL
  Filled 2020-05-06 (×2): qty 3

## 2020-05-06 MED ORDER — CHLORHEXIDINE GLUCONATE 0.12 % MT SOLN
15.0000 mL | Freq: Once | OROMUCOSAL | Status: AC
  Administered 2020-05-06: 15 mL via OROMUCOSAL

## 2020-05-06 MED ORDER — ASPIRIN EC 325 MG PO TBEC: 325.0000 mg | DELAYED_RELEASE_TABLET | Freq: Two times a day (BID) | ORAL | 0 refills | Status: DC

## 2020-05-06 MED ORDER — ONDANSETRON HCL 4 MG PO TABS: 4.0000 mg | ORAL_TABLET | Freq: Four times a day (QID) | ORAL | Status: DC | PRN

## 2020-05-06 MED ORDER — DIPHENHYDRAMINE HCL 12.5 MG/5ML PO ELIX: 12.5000 mg | ORAL_SOLUTION | ORAL | Status: DC | PRN

## 2020-05-06 MED ORDER — DEXAMETHASONE SODIUM PHOSPHATE 10 MG/ML IJ SOLN
INTRAMUSCULAR | Status: DC | PRN
  Administered 2020-05-06: 10 mg via INTRAVENOUS

## 2020-05-06 MED ORDER — METHOCARBAMOL 500 MG PO TABS
500.0000 mg | ORAL_TABLET | Freq: Four times a day (QID) | ORAL | Status: DC | PRN
  Administered 2020-05-06: 500 mg via ORAL
  Filled 2020-05-06 (×2): qty 1

## 2020-05-06 MED ORDER — OXYCODONE HCL 5 MG PO TABS: 5.0000 mg | ORAL_TABLET | Freq: Once | ORAL | Status: DC | PRN

## 2020-05-06 MED ORDER — HYDROMORPHONE HCL 1 MG/ML IJ SOLN
INTRAMUSCULAR | Status: DC | PRN
  Administered 2020-05-06 (×5): .4 mg via INTRAVENOUS

## 2020-05-06 MED ORDER — FENTANYL CITRATE (PF) 100 MCG/2ML IJ SOLN
INTRAMUSCULAR | Status: DC | PRN
  Administered 2020-05-06 (×5): 50 ug via INTRAVENOUS

## 2020-05-06 MED ORDER — METHOCARBAMOL 500 MG IVPB - SIMPLE MED
500.0000 mg | Freq: Four times a day (QID) | INTRAVENOUS | Status: DC | PRN
  Filled 2020-05-06: qty 50

## 2020-05-06 MED ORDER — AMPHETAMINE-DEXTROAMPHETAMINE 10 MG PO TABS
15.0000 mg | ORAL_TABLET | Freq: Four times a day (QID) | ORAL | Status: DC
  Administered 2020-05-07 (×2): 15 mg via ORAL
  Filled 2020-05-06 (×2): qty 2

## 2020-05-06 MED ORDER — DOCUSATE SODIUM 100 MG PO CAPS
100.0000 mg | ORAL_CAPSULE | Freq: Two times a day (BID) | ORAL | Status: DC
  Administered 2020-05-06 – 2020-05-07 (×2): 100 mg via ORAL
  Filled 2020-05-06 (×2): qty 1

## 2020-05-06 MED ORDER — PROMETHAZINE HCL 25 MG/ML IJ SOLN: 6.2500 mg | INTRAMUSCULAR | Status: DC | PRN

## 2020-05-06 MED ORDER — HYDROMORPHONE HCL 1 MG/ML IJ SOLN
0.5000 mg | INTRAMUSCULAR | Status: DC | PRN
  Administered 2020-05-06: 1 mg via INTRAVENOUS
  Filled 2020-05-06: qty 1

## 2020-05-06 MED ORDER — ACETAMINOPHEN 500 MG PO TABS
1000.0000 mg | ORAL_TABLET | Freq: Four times a day (QID) | ORAL | Status: DC
  Administered 2020-05-06 – 2020-05-07 (×3): 1000 mg via ORAL
  Filled 2020-05-06 (×3): qty 2

## 2020-05-06 SURGICAL SUPPLY — 56 items
ATTUNE MED DOME PAT 41 KNEE (Knees) ×2 IMPLANT
ATTUNE PS FEM LT CEM SZ9 KNEE (Femur) ×2 IMPLANT
BAG ZIPLOCK 12X15 (MISCELLANEOUS) IMPLANT
BASE TIBIAL CEM ATTUNE SZ 7 (Knees) ×2 IMPLANT
BASEPLATE TIB CEM ATTUNE SZ7 (Knees) ×1 IMPLANT
BLADE SAG 18X100X1.27 (BLADE) ×4 IMPLANT
BLADE SURG 15 STRL LF DISP TIS (BLADE) ×1 IMPLANT
BLADE SURG 15 STRL SS (BLADE) ×1
BNDG ELASTIC 6X10 VLCR STRL LF (GAUZE/BANDAGES/DRESSINGS) ×2 IMPLANT
BNDG ELASTIC 6X15 VLCR STRL LF (GAUZE/BANDAGES/DRESSINGS) ×2 IMPLANT
BOWL SMART MIX CTS (DISPOSABLE) ×2 IMPLANT
CEMENT BONE R 1X40 (Cement) ×4 IMPLANT
CLSR STERI-STRIP ANTIMIC 1/2X4 (GAUZE/BANDAGES/DRESSINGS) ×2 IMPLANT
COVER SURGICAL LIGHT HANDLE (MISCELLANEOUS) ×2 IMPLANT
COVER WAND RF STERILE (DRAPES) IMPLANT
CUFF TOURN SGL QUICK 34 (TOURNIQUET CUFF) ×1
CUFF TRNQT CYL 34X4.125X (TOURNIQUET CUFF) ×1 IMPLANT
DECANTER SPIKE VIAL GLASS SM (MISCELLANEOUS) IMPLANT
DRAPE ORTHO SPLIT 77X108 STRL (DRAPES)
DRAPE SURG ORHT 6 SPLT 77X108 (DRAPES) IMPLANT
DRAPE U-SHAPE 47X51 STRL (DRAPES) ×2 IMPLANT
DRSG MEPILEX BORDER 4X8 (GAUZE/BANDAGES/DRESSINGS) ×2 IMPLANT
DRSG MEPILEX SACRM 8.7X9.8 (GAUZE/BANDAGES/DRESSINGS) ×2 IMPLANT
DRSG PAD ABDOMINAL 8X10 ST (GAUZE/BANDAGES/DRESSINGS) ×2 IMPLANT
DURAPREP 26ML APPLICATOR (WOUND CARE) ×4 IMPLANT
ELECT REM PT RETURN 15FT ADLT (MISCELLANEOUS) ×2 IMPLANT
GLOVE BIO SURGEON STRL SZ7 (GLOVE) ×2 IMPLANT
GLOVE BIO SURGEON STRL SZ7.5 (GLOVE) ×2 IMPLANT
GLOVE BIOGEL PI IND STRL 7.0 (GLOVE) ×1 IMPLANT
GLOVE BIOGEL PI IND STRL 8 (GLOVE) ×1 IMPLANT
GLOVE BIOGEL PI INDICATOR 7.0 (GLOVE) ×1
GLOVE BIOGEL PI INDICATOR 8 (GLOVE) ×1
GOWN STRL REUS W/TWL LRG LVL3 (GOWN DISPOSABLE) ×4 IMPLANT
HANDPIECE INTERPULSE COAX TIP (DISPOSABLE) ×1
HOLDER FOLEY CATH W/STRAP (MISCELLANEOUS) IMPLANT
HOOD PEEL AWAY FLYTE STAYCOOL (MISCELLANEOUS) ×6 IMPLANT
IMMOBILIZER KNEE 20 (SOFTGOODS) ×2
IMMOBILIZER KNEE 20 THIGH 36 (SOFTGOODS) ×1 IMPLANT
INSERT TIB KNEE ATTUNE POST 9 (Knees) ×2 IMPLANT
KIT TURNOVER KIT A (KITS) IMPLANT
MANIFOLD NEPTUNE II (INSTRUMENTS) ×2 IMPLANT
NS IRRIG 1000ML POUR BTL (IV SOLUTION) ×2 IMPLANT
PACK ICE MAXI GEL EZY WRAP (MISCELLANEOUS) ×2 IMPLANT
PACK TOTAL KNEE CUSTOM (KITS) ×2 IMPLANT
PENCIL SMOKE EVACUATOR (MISCELLANEOUS) IMPLANT
PIN DRILL FIX HALF THREAD (BIT) ×2 IMPLANT
PIN STEINMAN FIXATION KNEE (PIN) ×2 IMPLANT
PROTECTOR NERVE ULNAR (MISCELLANEOUS) ×2 IMPLANT
SET HNDPC FAN SPRY TIP SCT (DISPOSABLE) ×1 IMPLANT
SUT VIC AB 1 CT1 36 (SUTURE) ×4 IMPLANT
SUT VIC AB 2-0 CT1 27 (SUTURE) ×1
SUT VIC AB 2-0 CT1 TAPERPNT 27 (SUTURE) ×1 IMPLANT
SUT VIC AB 3-0 SH 8-18 (SUTURE) ×2 IMPLANT
TRAY FOLEY MTR SLVR 16FR STAT (SET/KITS/TRAYS/PACK) ×2 IMPLANT
WATER STERILE IRR 1000ML POUR (IV SOLUTION) ×4 IMPLANT
WRAP KNEE MAXI GEL POST OP (GAUZE/BANDAGES/DRESSINGS) ×2 IMPLANT

## 2020-05-06 NOTE — Progress Notes (Signed)
Assisted Dr. Greg Stoltzfus with left, ultrasound guided, adductor canal block. Side rails up, monitors on throughout procedure. See vital signs in flow sheet. Tolerated Procedure well.  

## 2020-05-06 NOTE — Anesthesia Postprocedure Evaluation (Signed)
Anesthesia Post Note  Patient: Belmont Valli III  Procedure(s) Performed: TOTAL KNEE ARTHROPLASTY (Left Knee)     Patient location during evaluation: PACU Anesthesia Type: Regional and General Level of consciousness: sedated Pain management: pain level controlled Vital Signs Assessment: post-procedure vital signs reviewed and stable Respiratory status: spontaneous breathing Cardiovascular status: stable Postop Assessment: no apparent nausea or vomiting Anesthetic complications: no   No complications documented.  Last Vitals:  Vitals:   05/06/20 1715 05/06/20 1742  BP: (!) 144/91 (!) 142/94  Pulse: 91 92  Resp: 13 18  Temp:  36.7 C  SpO2: 99% 100%    Last Pain:  Vitals:   05/06/20 1742  TempSrc: Oral  PainSc:                  Caren Macadam

## 2020-05-06 NOTE — Anesthesia Preprocedure Evaluation (Signed)
Anesthesia Evaluation  Patient identified by MRN, date of birth, ID band Patient awake    Reviewed: Allergy & Precautions, NPO status , Patient's Chart, lab work & pertinent test results  Airway Mallampati: II  TM Distance: >3 FB Neck ROM: Full    Dental  (+) Teeth Intact   Pulmonary neg pulmonary ROS, Current Smoker,    Pulmonary exam normal        Cardiovascular hypertension, Pt. on medications  Rhythm:Regular Rate:Normal     Neuro/Psych negative neurological ROS  negative psych ROS   GI/Hepatic negative GI ROS, Neg liver ROS,   Endo/Other  Hyperthyroidism   Renal/GU negative Renal ROS  negative genitourinary   Musculoskeletal  (+) Arthritis , Osteoarthritis,    Abdominal (+)   Bowel sounds: normal.  Peds  Hematology negative hematology ROS (+)   Anesthesia Other Findings   Reproductive/Obstetrics                             Anesthesia Physical Anesthesia Plan  ASA: II  Anesthesia Plan: Spinal, Regional and MAC   Post-op Pain Management:  Regional for Post-op pain   Induction:   PONV Risk Score and Plan: 0 and Ondansetron and Propofol infusion  Airway Management Planned: Simple Face Mask, Natural Airway and Nasal Cannula  Additional Equipment: None  Intra-op Plan:   Post-operative Plan:   Informed Consent: I have reviewed the patients History and Physical, chart, labs and discussed the procedure including the risks, benefits and alternatives for the proposed anesthesia with the patient or authorized representative who has indicated his/her understanding and acceptance.     Dental advisory given  Plan Discussed with: CRNA  Anesthesia Plan Comments: (Lab Results      Component                Value               Date                      WBC                      6.4                 04/30/2020                HGB                      14.9                04/30/2020                 HCT                      45.5                04/30/2020                MCV                      85.8                04/30/2020                PLT                      326  04/30/2020          )        Anesthesia Quick Evaluation

## 2020-05-06 NOTE — Interval H&P Note (Signed)
History and Physical Interval Note:  05/06/2020 9:48 AM  Steven Gibson  has presented today for surgery, with the diagnosis of djd left knee.  The various methods of treatment have been discussed with the patient and family. After consideration of risks, benefits and other options for treatment, the patient has consented to  Procedure(s): TOTAL KNEE ARTHROPLASTY (Left) as a surgical intervention.  The patient's history has been reviewed, patient examined, no change in status, stable for surgery.  I have reviewed the patient's chart and labs.  Questions were answered to the patient's satisfaction.     Eulas Post

## 2020-05-06 NOTE — Anesthesia Procedure Notes (Signed)
Anesthesia Regional Block: Adductor canal block   Pre-Anesthetic Checklist: ,, timeout performed, Correct Patient, Correct Site, Correct Laterality, Correct Procedure, Correct Position, site marked, Risks and benefits discussed,  Surgical consent,  Pre-op evaluation,  At surgeon's request and post-op pain management  Laterality: Left  Prep: Dura Prep       Needles:  Injection technique: Single-shot  Needle Type: Echogenic Stimulator Needle     Needle Length: 9cm  Needle Gauge: 20     Additional Needles:   Procedures:,,,, ultrasound used (permanent image in chart),,,,  Narrative:  Start time: 05/06/2020 9:51 AM End time: 05/06/2020 9:56 AM Injection made incrementally with aspirations every 5 mL.  Performed by: Personally  Anesthesiologist: Atilano Median, DO  Additional Notes: Patient identified. Risks/Benefits/Options discussed with patient including but not limited to bleeding, infection, nerve damage, failed block, incomplete pain control. Patient expressed understanding and wished to proceed. All questions were answered. Sterile technique was used throughout the entire procedure. Please see nursing notes for vital signs. Aspirated in 5cc intervals with injection for negative confirmation. Patient was given instructions on fall risk and not to get out of bed. All questions and concerns addressed with instructions to call with any issues or inadequate analgesia.

## 2020-05-06 NOTE — Anesthesia Procedure Notes (Signed)
Procedure Name: LMA Insertion Date/Time: 05/06/2020 11:19 AM Performed by: Florene Route, CRNA Patient Re-evaluated:Patient Re-evaluated prior to induction Oxygen Delivery Method: Circle system utilized Preoxygenation: Pre-oxygenation with 100% oxygen Induction Type: IV induction Ventilation: Mask ventilation without difficulty LMA: LMA inserted LMA Size: 5.0 Number of attempts: 1 Placement Confirmation: positive ETCO2 and breath sounds checked- equal and bilateral Tube secured with: Tape Dental Injury: Teeth and Oropharynx as per pre-operative assessment

## 2020-05-06 NOTE — Discharge Instructions (Signed)
INSTRUCTIONS AFTER JOINT REPLACEMENT   o Remove items at home which could result in a fall. This includes throw rugs or furniture in walking pathways o ICE to the affected joint every three hours while awake for 30 minutes at a time, for at least the first 3-5 days, and then as needed for pain and swelling.  Continue to use ice for pain and swelling. You may notice swelling that will progress down to the foot and ankle.  This is normal after surgery.  Elevate your leg when you are not up walking on it.   o Continue to use the breathing machine you got in the hospital (incentive spirometer) which will help keep your temperature down.  It is common for your temperature to cycle up and down following surgery, especially at night when you are not up moving around and exerting yourself.  The breathing machine keeps your lungs expanded and your temperature down.   DIET:  As you were doing prior to hospitalization, we recommend a well-balanced diet.  DRESSING / WOUND CARE / SHOWERING  You may change your dressing 3-5 days after surgery.  Then change the dressing every day with sterile gauze.  Please use good hand washing techniques before changing the dressing.  Do not use any lotions or creams on the incision until instructed by your surgeon.  ACTIVITY  o Increase activity slowly as tolerated, but follow the weight bearing instructions below.   o No driving for 6 weeks or until further direction given by your physician.  You cannot drive while taking narcotics.  o No lifting or carrying greater than 10 lbs. until further directed by your surgeon. o Avoid periods of inactivity such as sitting longer than an hour when not asleep. This helps prevent blood clots.  o You may return to work once you are authorized by your doctor.     WEIGHT BEARING   Weight bearing as tolerated with assist device (walker, cane, etc) as directed, use it as long as suggested by your surgeon or therapist, typically at  least 4-6 weeks.   EXERCISES  Results after joint replacement surgery are often greatly improved when you follow the exercise, range of motion and muscle strengthening exercises prescribed by your doctor. Safety measures are also important to protect the joint from further injury. Any time any of these exercises cause you to have increased pain or swelling, decrease what you are doing until you are comfortable again and then slowly increase them. If you have problems or questions, call your caregiver or physical therapist for advice.   Rehabilitation is important following a joint replacement. After just a few days of immobilization, the muscles of the leg can become weakened and shrink (atrophy).  These exercises are designed to build up the tone and strength of the thigh and leg muscles and to improve motion. Often times heat used for twenty to thirty minutes before working out will loosen up your tissues and help with improving the range of motion but do not use heat for the first two weeks following surgery (sometimes heat can increase post-operative swelling).   These exercises can be done on a training (exercise) mat, on the floor, on a table or on a bed. Use whatever works the best and is most comfortable for you.    Use music or television while you are exercising so that the exercises are a pleasant break in your day. This will make your life better with the exercises acting as a break   in your routine that you can look forward to.   Perform all exercises about fifteen times, three times per day or as directed.  You should exercise both the operative leg and the other leg as well.  Exercises include:   . Quad Sets - Tighten up the muscle on the front of the thigh (Quad) and hold for 5-10 seconds.   . Straight Leg Raises - With your knee straight (if you were given a brace, keep it on), lift the leg to 60 degrees, hold for 3 seconds, and slowly lower the leg.  Perform this exercise against  resistance later as your leg gets stronger.  . Leg Slides: Lying on your back, slowly slide your foot toward your buttocks, bending your knee up off the floor (only go as far as is comfortable). Then slowly slide your foot back down until your leg is flat on the floor again.  . Angel Wings: Lying on your back spread your legs to the side as far apart as you can without causing discomfort.  . Hamstring Strength:  Lying on your back, push your heel against the floor with your leg straight by tightening up the muscles of your buttocks.  Repeat, but this time bend your knee to a comfortable angle, and push your heel against the floor.  You may put a pillow under the heel to make it more comfortable if necessary.   A rehabilitation program following joint replacement surgery can speed recovery and prevent re-injury in the future due to weakened muscles. Contact your doctor or a physical therapist for more information on knee rehabilitation.    CONSTIPATION  Constipation is defined medically as fewer than three stools per week and severe constipation as less than one stool per week.  Even if you have a regular bowel pattern at home, your normal regimen is likely to be disrupted due to multiple reasons following surgery.  Combination of anesthesia, postoperative narcotics, change in appetite and fluid intake all can affect your bowels.   YOU MUST use at least one of the following options; they are listed in order of increasing strength to get the job done.  They are all available over the counter, and you may need to use some, POSSIBLY even all of these options:    Drink plenty of fluids (prune juice may be helpful) and high fiber foods Colace 100 mg by mouth twice a day  Senokot for constipation as directed and as needed Dulcolax (bisacodyl), take with full glass of water  Miralax (polyethylene glycol) once or twice a day as needed.  If you have tried all these things and are unable to have a bowel  movement in the first 3-4 days after surgery call either your surgeon or your primary doctor.    If you experience loose stools or diarrhea, hold the medications until you stool forms back up.  If your symptoms do not get better within 1 week or if they get worse, check with your doctor.  If you experience "the worst abdominal pain ever" or develop nausea or vomiting, please contact the office immediately for further recommendations for treatment.   ITCHING:  If you experience itching with your medications, try taking only a single pain pill, or even half a pain pill at a time.  You can also use Benadryl over the counter for itching or also to help with sleep.   TED HOSE STOCKINGS:  Use stockings on both legs until for at least 2 weeks or as   directed by physician office. They may be removed at night for sleeping.  MEDICATIONS:  See your medication summary on the "After Visit Summary" that nursing will review with you.  You may have some home medications which will be placed on hold until you complete the course of blood thinner medication.  It is important for you to complete the blood thinner medication as prescribed.  PRECAUTIONS:  If you experience chest pain or shortness of breath - call 911 immediately for transfer to the hospital emergency department.   If you develop a fever greater that 101 F, purulent drainage from wound, increased redness or drainage from wound, foul odor from the wound/dressing, or calf pain - CONTACT YOUR SURGEON.                                                   FOLLOW-UP APPOINTMENTS:  If you do not already have a post-op appointment, please call the office for an appointment to be seen by your surgeon.  Guidelines for how soon to be seen are listed in your "After Visit Summary", but are typically between 1-4 weeks after surgery.  OTHER INSTRUCTIONS:   Knee Replacement:  Do not place pillow under knee, focus on keeping the knee straight while resting. CPM  instructions: 0-90 degrees, 2 hours in the morning, 2 hours in the afternoon, and 2 hours in the evening. Place foam block, curve side up under heel at all times except when in CPM or when walking.  DO NOT modify, tear, cut, or change the foam block in any way.   DENTAL ANTIBIOTICS:  In most cases prophylactic antibiotics for Dental procdeures after total joint surgery are not necessary.  Exceptions are as follows:  1. History of prior total joint infection  2. Severely immunocompromised (Organ Transplant, cancer chemotherapy, Rheumatoid biologic meds such as Humera)  3. Poorly controlled diabetes (A1C &gt; 8.0, blood glucose over 200)  If you have one of these conditions, contact your surgeon for an antibiotic prescription, prior to your dental procedure.   MAKE SURE YOU:  . Understand these instructions.  . Get help right away if you are not doing well or get worse.    Thank you for letting us be a part of your medical care team.  It is a privilege we respect greatly.  We hope these instructions will help you stay on track for a fast and full recovery!   

## 2020-05-06 NOTE — Op Note (Signed)
DATE OF SURGERY:  05/06/2020 TIME: 2:46 PM  PATIENT NAME:  Steven Gibson   AGE: 49 y.o.    PRE-OPERATIVE DIAGNOSIS:  Left knee posttraumatic arthrosis  POST-OPERATIVE DIAGNOSIS:  Same  PROCEDURE: LEFT total Knee Arthroplasty with removal of hardware, deep  SURGEON:  Eulas Post, MD   ASSISTANT:  Janine Ores, PA-C, present and scrubbed throughout the case, critical for assistance with exposure, retraction, instrumentation, and closure.   OPERATIVE IMPLANTS: Attune from DePuy femur size 9, tibia size 7, with a 5 mm polyethylene insert, and a 41 mm medialized patellar oval dome   PREOPERATIVE INDICATIONS:  Steven Gibson is a 49 y.o. year old male with end stage bone on bone degenerative arthritis of the knee who failed conservative treatment, including injections, antiinflammatories, activity modification, and assistive devices, and had significant impairment of their activities of daily living, and elected for Total Knee Arthroplasty.   The risks, benefits, and alternatives were discussed at length including but not limited to the risks of infection, bleeding, nerve injury, stiffness, blood clots, the need for revision surgery, cardiopulmonary complications, among others, and they were willing to proceed.  OPERATIVE FINDINGS AND UNIQUE ASPECTS OF THE CASE: Access was extremely difficult.  I cut the tibia twice.  I would have liked to have gone down to a size 8 on the femur, although I was flush with the size 9, I did not take that much off of the posterior lateral femur.  Preoperatively he had substantial loss of motion, with exam under anesthesia demonstrating 20 degrees to about 85 degrees at best.  Was very difficult to get the patella lateral.  The patella was fairly thick as well, it measured 28 before the cut, and 18 after the cut.  I was a little bit thicker distally than it was proximally, but because he had a bone patella bone autograft, I was reluctant to  take more distally off of the patella.  He had extreme sclerosis on the tibia, and preparation with the punch and the drill was very difficult.  The metal interference screw was prominent on the femur and in the way of the box cut, so I had to remove that as well.  I cut the femur on 10 mm.  ESTIMATED BLOOD LOSS: 150 mL  OPERATIVE DESCRIPTION:  The patient was brought to the operative room and placed in a supine position.  Anesthesia was administered.  Initially they started with a spinal, but was not successful and converted to a general.  IV antibiotics were given.  The lower extremity was prepped and draped in the usual sterile fashion.  Time out was performed.  The leg was elevated and exsanguinated and the tourniquet was inflated.  Anterior quadriceps tendon splitting approach was performed.  The patella was everted and osteophytes were removed.  The anterior horn of the medial and lateral meniscus was removed.   The patella was exposed, and measured 28 mm, and then was cut to 18 mm, and I was slightly thicker distally than I was proximally.  The distal femur was opened with the drill and the intramedullary distal femoral cutting jig was utilized, set at 5 degrees resecting 10 mm off the distal femur.  Care was taken to protect the collateral ligaments.  Then the extramedullary tibial cutting jig was utilized making the appropriate cut using the anterior tibial crest as a reference building in appropriate posterior slope.  Care was taken during the cut to protect the medial and  collateral ligaments.  The proximal tibia was removed along with the posterior horns of the menisci.  The PCL was sacrificed.  Access to the proximal tibia was difficult.  The extensor gap was measured and accommodated the 5 mm polyethylene insert and actually was able to reach full extension.    The distal femoral sizing jig was applied, taking care to avoid notching.  Then the 4-in-1 cutting jig was applied and the  anterior and posterior femur was cut, along with the chamfer cuts.  All posterior osteophytes were removed.  The flexion gap was then measured and was symmetric with the extension gap.  The posterior lateral cut did not take a lot.  I completed the distal femoral preparation using the appropriate jig to prepare the box.  I had to remove the interference screw because it was in the way.  The proximal tibia sized and prepared accordingly with the reamer and the punch, which was difficult, and I had actually denies this saw on the keel cuts, because of significant sclerosis, and after attempting to trial the components I could not get the polyethylene in, I went back and took another 2 mm off of the tibia although the sclerotic bone caused the saw to skive, so I ended up positioning and the 4 mm cut and then reprepping the tibia.  All components were trialed with the 5 mm poly insert.  The knee was found to have excellent balance and full motion.    The above named components were then cemented into place and all excess cement was removed.  The real polyethylene implant was placed.  It was still a little challenging to get in.    I had already released the tourniquet during the case when I reached the ~2 hour mark.  The knee was easily taken through a range of motion and the patella tracked well and the knee irrigated copiously and the parapatellar and subcutaneous tissue closed with vicryl, and monocryl with steri strips for the skin.  The wounds were injected with marcaine, and dressed with sterile gauze and the patient was awakened and returned to the PACU in stable and satisfactory condition.  There were no complications.  Total tourniquet time was 115 minutes.

## 2020-05-06 NOTE — Transfer of Care (Signed)
Immediate Anesthesia Transfer of Care Note  Patient: Steven Gibson  Procedure(s) Performed: TOTAL KNEE ARTHROPLASTY (Left Knee)  Patient Location: PACU  Anesthesia Type:GA combined with regional for post-op pain  Level of Consciousness: unresponsive  Airway & Oxygen Therapy: Patient Spontanous Breathing and Patient connected to face mask oxygen  Post-op Assessment: Report given to RN and Post -op Vital signs reviewed and stable  Post vital signs: Reviewed and stable  Last Vitals:  Vitals Value Taken Time  BP 148/100 05/06/20 1537  Temp    Pulse 109 05/06/20 1542  Resp 9 05/06/20 1542  SpO2 99 % 05/06/20 1542  Vitals shown include unvalidated device data.  Last Pain:  Vitals:   05/06/20 1022  TempSrc:   PainSc: 0-No pain      Patients Stated Pain Goal: 1 (05/06/20 0853)  Complications: No complications documented.

## 2020-05-07 ENCOUNTER — Encounter (HOSPITAL_COMMUNITY): Payer: Self-pay | Admitting: Orthopedic Surgery

## 2020-05-07 ENCOUNTER — Ambulatory Visit: Payer: Managed Care, Other (non HMO) | Admitting: Nurse Practitioner

## 2020-05-07 DIAGNOSIS — M1732 Unilateral post-traumatic osteoarthritis, left knee: Secondary | ICD-10-CM | POA: Diagnosis not present

## 2020-05-07 LAB — BASIC METABOLIC PANEL
Anion gap: 11 (ref 5–15)
BUN: 14 mg/dL (ref 6–20)
CO2: 24 mmol/L (ref 22–32)
Calcium: 8.3 mg/dL — ABNORMAL LOW (ref 8.9–10.3)
Chloride: 102 mmol/L (ref 98–111)
Creatinine, Ser: 1 mg/dL (ref 0.61–1.24)
GFR, Estimated: >60 mL/min (ref >60)
Glucose, Bld: 187 mg/dL — ABNORMAL HIGH (ref 70–99)
Potassium: 3.6 mmol/L (ref 3.5–5.1)
Sodium: 137 mmol/L (ref 135–145)

## 2020-05-07 LAB — CBC
HCT: 35.9 % — ABNORMAL LOW (ref 39.0–52.0)
Hemoglobin: 11.8 g/dL — ABNORMAL LOW (ref 13.0–17.0)
MCH: 28.5 pg (ref 26.0–34.0)
MCHC: 32.9 g/dL (ref 30.0–36.0)
MCV: 86.7 fL (ref 80.0–100.0)
Platelets: 276 10*3/uL (ref 150–400)
RBC: 4.14 MIL/uL — ABNORMAL LOW (ref 4.22–5.81)
RDW: 13.4 % (ref 11.5–15.5)
WBC: 11 10*3/uL — ABNORMAL HIGH (ref 4.0–10.5)
nRBC: 0 % (ref 0.0–0.2)

## 2020-05-07 NOTE — TOC Transition Note (Signed)
Transition of Care [facility]) - CM/SW Discharge Note   Patient Details  Name: Steven Gibson MRN: [id number] Date of Birth: 25-Nov-1970  [facility] CM/SW Contact:  Lennart Pall, LCSW Phone Number: 05/07/2020, 11:46 AM   Clinical Narrative:    Met briefly with pt and spouse and confirming DME delivered to room.  Plan for OPPT at Ortho office.  No further TOC needs.   Final next level of care: OP Rehab Barriers to Discharge: No Barriers Identified   Patient Goals and CMS Choice Patient states their goals for this hospitalization and ongoing recovery are:: go home      Discharge Placement                       Discharge Plan and Services                DME Arranged: 3-N-1,Walker rolling DME Agency: [employer] (ordered prior to surgery)                  Social Determinants of Health (SDOH) Interventions     Readmission Risk Interventions No flowsheet data found.

## 2020-05-07 NOTE — Evaluation (Signed)
Physical Therapy Evaluation Patient Details Name: Steven Gibson MRN: [id number] DOB: November 12, 1970 Today's Date: 05/07/2020   History of Present Illness  Pt is a 49 year old male s/p Left TKA  Clinical Impression  Patient evaluated by Physical Therapy with no further acute PT needs identified. All education has been completed and the patient has no further questions.  Pt ambulated in hallway and practiced safe stair technique.  Pt provided with ice packs end of session.  Pt educated to perform ankle pumps, knee extension, and knee flexion exercises 10 repetitions 2-3/day until starting with follow up OPPT.  Pt feels ready for d/c home. PT is signing off. Thank you for this referral.     Follow Up Recommendations Follow surgeon's recommendation for DC plan and follow-up therapies;Outpatient PT    Equipment Recommendations  Rolling walker with 5" wheels;3in1 (PT)    Recommendations for Other Services       Precautions / Restrictions Precautions Precautions: Fall;Knee Required Braces or Orthoses: Knee Immobilizer - Left Restrictions Weight Bearing Restrictions: No      Mobility  Bed Mobility Overal bed mobility: Needs Assistance Bed Mobility: Supine to Sit     Supine to sit: Supervision;HOB elevated          Transfers Overall transfer level: Needs assistance Equipment used: Rolling walker (2 wheeled) Transfers: Sit to/from Stand Sit to Stand: Min guard         General transfer comment: verbal cues for safe technique  Ambulation/Gait Ambulation/Gait assistance: Min guard Gait Distance (Feet): 320 Feet Assistive device: Rolling walker (2 wheeled) Gait Pattern/deviations: Step-to pattern;Antalgic;Trunk flexed;Decreased stance time - left     General Gait Details: verbal cues for sequence, RW positioning, step length, posture  Stairs Stairs: Yes Stairs assistance: Min guard Stair Management: Backwards;With walker;Step to pattern Number of Stairs:  4 General stair comments: verbal cues for RW positioning and safety, spouse present and observed; performed one step with RW forwards and then 4 steps with RW backwards with spouse holding walker; spouse and pt report understanding  Wheelchair Mobility    Modified Rankin (Stroke Patients Only)       Balance                                             Pertinent Vitals/Pain Pain Assessment: 0-10 Pain Score: 3  Pain Location: left knee Pain Descriptors / Indicators: Aching;Sore Pain Intervention(s): Repositioned;Premedicated before session;Monitored during session;Ice applied    Home Living Family/patient expects to be discharged to:: Private residence Living Arrangements: Spouse/significant other   Type of Home: House Home Access: Stairs to enter Entrance Stairs-Rails: None Entrance Stairs-Number of Steps: 1 Home Layout: Able to live on main level with bedroom/bathroom;Two level Home Equipment: None      Prior Function Level of Independence: Independent               Hand Dominance        Extremity/Trunk Assessment        Lower Extremity Assessment Lower Extremity Assessment: LLE deficits/detail LLE Deficits / Details: unable to perform SLR, able to perform ankle pumps       Communication   Communication: No difficulties  Cognition Arousal/Alertness: Awake/alert Behavior During Therapy: WFL for tasks assessed/performed Overall Cognitive Status: Within Functional Limits for tasks assessed  General Comments      Exercises     Assessment/Plan    PT Assessment All further PT needs can be met in the next venue of care  PT Problem List Decreased strength;Decreased range of motion;Pain;Decreased knowledge of use of DME       PT Treatment Interventions      PT Goals (Current goals can be found in the Care Plan section)  Acute Rehab PT Goals PT Goal Formulation: All  assessment and education complete, DC therapy    Frequency     Barriers to discharge        Co-evaluation               AM-PAC PT "6 Clicks" Mobility  Outcome Measure Help needed turning from your back to your side while in a flat bed without using bedrails?: None Help needed moving from lying on your back to sitting on the side of a flat bed without using bedrails?: A Little Help needed moving to and from a bed to a chair (including a wheelchair)?: A Little Help needed standing up from a chair using your arms (e.g., wheelchair or bedside chair)?: A Little Help needed to walk in hospital room?: A Little Help needed climbing 3-5 steps with a railing? : A Little 6 Click Score: 19    End of Session Equipment Utilized During Treatment: Gait belt Activity Tolerance: Patient tolerated treatment well Patient left: in chair;with call bell/phone within reach;with family/visitor present Nurse Communication: Mobility status PT Visit Diagnosis: Difficulty in walking, not elsewhere classified (R26.2)    Time: 8889-1694 PT Time Calculation (min) (ACUTE ONLY): 24 min   Charges:   PT Evaluation $PT Eval Low Complexity: 1 Low PT Treatments $Gait Training: 8-22 mins       Arlyce Dice, DPT Acute Rehabilitation Services Pager: [phone] Office: [phone]  York Ram E 05/07/2020, 12:21 PM

## 2020-05-07 NOTE — Progress Notes (Signed)
     Subjective: 1 Day Post-Op s/p Procedure(s): TOTAL KNEE ARTHROPLASTY   Patient is alert, oriented, yes Patient reports pain as moderate.   Denies chest pain, SOB, Calf pain. No nausea/vomiting. No other complaints.  Objective:  PE: VITALS:   Vitals:   05/06/20 2032 05/06/20 2141 05/07/20 0150 05/07/20 0524  BP: 134/79 139/86 126/72 127/81  Pulse: 87 88 91 79  Resp: 16 16 16 16   Temp: 97.9 F (36.6 C) 97.7 F (36.5 C) 98.5 F (36.9 C) 98.2 F (36.8 C)  TempSrc: Oral Oral Oral Oral  SpO2: 100% 98% 100% 99%  Weight:      Height:        ABD soft Neurovascular intact Sensation intact distally Intact pulses distally Dorsiflexion/Plantar flexion intact Incision: moderate drainage Compartment soft  LABS  Results for orders placed or performed during the hospital encounter of 05/06/20 (from the past 24 hour(s))  CBC     Status: Abnormal   Collection Time: 05/07/20  3:29 AM  Result Value Ref Range   WBC 11.0 (H) 4.0 - 10.5 K/uL   RBC 4.14 (L) 4.22 - 5.81 MIL/uL   Hemoglobin 11.8 (L) 13.0 - 17.0 g/dL   HCT 05/09/20 (L) 86.7 - 61.9 %   MCV 86.7 80.0 - 100.0 fL   MCH 28.5 26.0 - 34.0 pg   MCHC 32.9 30.0 - 36.0 g/dL   RDW 50.9 32.6 - 71.2 %   Platelets 276 150 - 400 K/uL   nRBC 0.0 0.0 - 0.2 %  Basic metabolic panel     Status: Abnormal   Collection Time: 05/07/20  3:29 AM  Result Value Ref Range   Sodium 137 135 - 145 mmol/L   Potassium 3.6 3.5 - 5.1 mmol/L   Chloride 102 98 - 111 mmol/L   CO2 24 22 - 32 mmol/L   Glucose, Bld 187 (H) 70 - 99 mg/dL   BUN 14 6 - 20 mg/dL   Creatinine, Ser 05/09/20 0.61 - 1.24 mg/dL   Calcium 8.3 (L) 8.9 - 10.3 mg/dL   GFR, Estimated 0.99 >83 mL/min   Anion gap 11 5 - 15    DG Knee Left Port  Result Date: 05/06/2020 CLINICAL DATA:  Status post left knee replacement EXAM: PORTABLE LEFT KNEE - 2 VIEW COMPARISON:  None. FINDINGS: Left knee prosthesis is noted in satisfactory position. No acute soft tissue or bony abnormality is seen.  IMPRESSION: Status post left knee replacement. Electronically Signed   By: 05/08/2020 M.D.   On: 05/06/2020 16:48    Assessment/Plan: Principal Problem:   S/P total knee arthroplasty, left    1 Day Post-Op s/p Procedure(s): TOTAL KNEE ARTHROPLASTY  Weightbearing: WBAT LLE, up with therapy Insicional and dressing care: Reinforce dressings as needed - mepilex changed this AM with moderate drainage. If drainage seen on mepilex this afternoon please change prior to discharge and reinforce with abd's and ace wrap. VTE prophylaxis: Aspirin 325mg  BID x 30 days Pain control: oxycodone. Patient has received multiple IV dilaudid doses, will need to be on PO only prior to discharge. Follow - up plan: 2 weeks x 30 days. Dispo: pending PT eval, ok to discharge if passes PT Contact information:   Weekdays 8-5 , PA-C [phone] A fter hours and holidays please check [email or web address] for group call information for [facility]  Janine Ores 05/07/2020, 8:14 AM

## 2020-05-07 NOTE — Discharge Summary (Signed)
Discharge Summary  Patient ID: Steven Gibson MRN: [id number] DOB/AGE: [birth year] 49 y.o.  Admit date: 05/06/2020 Discharge date: 05/07/2020  Admission Diagnoses:  Left knee osteoarthritis  Discharge Diagnoses:  Principal Problem:   S/P total knee arthroplasty, left   Past Medical History:  Diagnosis Date  . Arthritis   . Graves disease   . Hyperlipidemia   . Hypertension     Surgeries: Procedure(s): TOTAL KNEE ARTHROPLASTY on 05/06/2020   Consultants (if any):   Discharged Condition: Improved  Hospital Course: Steven Gibson is an 49 y.o. male who was admitted 05/06/2020 with a diagnosis of S/P total knee arthroplasty, left and went to the operating room on 05/06/2020 and underwent the above named procedures.    He was given perioperative antibiotics:  Anti-infectives (From admission, onward)   Start     Dose/Rate Route Frequency Ordered Stop   05/06/20 1830  ceFAZolin (ANCEF) IVPB 2g/100 mL premix        2 g 200 mL/hr over 30 Minutes Intravenous Every 6 hours 05/06/20 1738 05/07/20 0229   05/06/20 0830  ceFAZolin (ANCEF) IVPB 2g/100 mL premix        2 g 200 mL/hr over 30 Minutes Intravenous On call to O.R. 05/06/20 9563 05/06/20 1114    .  He was given sequential compression devices, early ambulation, and aspirin for DVT prophylaxis.  He benefited maximally from the hospital stay and there were no complications.    Recent vital signs:  Vitals:   05/07/20 0524 05/07/20 1001  BP: 127/81 132/75  Pulse: 79 93  Resp: 16 18  Temp: 98.2 F (36.8 C) 99 F (37.2 C)  SpO2: 99% 99%    Recent laboratory studies:  Lab Results  Component Value Date   HGB 11.8 (L) 05/07/2020   HGB 14.9 04/30/2020   HGB 15.6 04/08/2020   Lab Results  Component Value Date   WBC 11.0 (H) 05/07/2020   PLT 276 05/07/2020   No results found for: INR Lab Results  Component Value Date   NA 137 05/07/2020   K 3.6 05/07/2020   CL 102 05/07/2020   CO2 24  05/07/2020   BUN 14 05/07/2020   CREATININE 1.00 05/07/2020   GLUCOSE 187 (H) 05/07/2020    Discharge Medications:   Allergies as of 05/07/2020   No Known Allergies     Medication List    STOP taking these medications   meloxicam 15 MG tablet Commonly known as: MOBIC     TAKE these medications   amLODipine 10 MG tablet Commonly known as: NORVASC Take 1 tablet (10 mg total) by mouth daily.   amphetamine-dextroamphetamine 15 MG tablet Commonly known as: ADDERALL Take 1 tablet by mouth 4 (four) times daily.   aspirin EC 325 MG tablet Take 1 tablet (325 mg total) by mouth 2 (two) times daily.   baclofen 10 MG tablet Commonly known as: LIORESAL Take 1 tablet (10 mg total) by mouth 3 (three) times daily. As needed for muscle spasm   escitalopram 20 MG tablet Commonly known as: LEXAPRO Take 1 tablet (20 mg total) by mouth daily.   icosapent Ethyl 1 g capsule Commonly known as: Vascepa Take 2 capsules (2 g total) by mouth 2 (two) times daily.   lisinopril-hydrochlorothiazide 20-25 MG tablet Commonly known as: ZESTORETIC Take 1 tablet by mouth daily.   ondansetron 4 MG tablet Commonly known as: Zofran Take 1 tablet (4 mg total) by mouth every 8 (eight) hours as needed for  nausea or vomiting.   oxyCODONE 5 MG immediate release tablet Commonly known as: Roxicodone Take 1 tablet (5 mg total) by mouth every 4 (four) hours as needed for severe pain.   pravastatin 20 MG tablet Commonly known as: PRAVACHOL Take 1 tablet (20 mg total) by mouth daily.   sennosides-docusate sodium 8.6-50 MG tablet Commonly known as: SENOKOT-S Take 2 tablets by mouth daily.       Diagnostic Studies: DG Knee Left Port  Result Date: 05/06/2020 CLINICAL DATA:  Status post left knee replacement EXAM: PORTABLE LEFT KNEE - 2 VIEW COMPARISON:  None. FINDINGS: Left knee prosthesis is noted in satisfactory position. No acute soft tissue or bony abnormality is seen. IMPRESSION: Status post left  knee replacement. Electronically Signed   By: Alcide Clever M.D.   On: 05/06/2020 16:48    Disposition: Discharge disposition: 01-Home or Self Care          Follow-up Information    Teryl Lucy, MD. Schedule an appointment as soon as possible for a visit in 2 weeks.   Specialty: Orthopedic Surgery Contact information: [address] [place] [address] [phone]                Signed: Annita Brod 05/07/2020, 12:37 PM

## 2020-05-07 NOTE — Plan of Care (Signed)
  Problem: Nutrition: Goal: Adequate nutrition will be maintained Outcome: Progressing   Problem: Pain Managment: Goal: General experience of comfort will improve Outcome: Progressing   

## 2020-05-10 ENCOUNTER — Other Ambulatory Visit: Payer: Self-pay | Admitting: Nurse Practitioner

## 2020-05-10 DIAGNOSIS — I10 Essential (primary) hypertension: Secondary | ICD-10-CM

## 2020-05-14 ENCOUNTER — Other Ambulatory Visit: Payer: Self-pay | Admitting: Nurse Practitioner

## 2020-05-14 DIAGNOSIS — F988 Other specified behavioral and emotional disorders with onset usually occurring in childhood and adolescence: Secondary | ICD-10-CM

## 2020-05-14 MED ORDER — AMPHETAMINE-DEXTROAMPHETAMINE 15 MG PO TABS: 15.0000 mg | ORAL_TABLET | Freq: Four times a day (QID) | ORAL | 0 refills | Status: DC

## 2020-05-14 NOTE — Telephone Encounter (Signed)
ADHD med refill

## 2020-06-16 ENCOUNTER — Other Ambulatory Visit: Payer: Self-pay

## 2020-06-16 ENCOUNTER — Encounter: Payer: Self-pay | Admitting: Nurse Practitioner

## 2020-06-16 ENCOUNTER — Ambulatory Visit (INDEPENDENT_AMBULATORY_CARE_PROVIDER_SITE_OTHER): Payer: Managed Care, Other (non HMO) | Admitting: Nurse Practitioner

## 2020-06-16 VITALS — BP 122/76 | HR 92 | Temp 98.6°F | Ht 73.0 in | Wt 224.4 lb

## 2020-06-16 DIAGNOSIS — E781 Pure hyperglyceridemia: Secondary | ICD-10-CM

## 2020-06-16 DIAGNOSIS — F988 Other specified behavioral and emotional disorders with onset usually occurring in childhood and adolescence: Secondary | ICD-10-CM

## 2020-06-16 DIAGNOSIS — I10 Essential (primary) hypertension: Secondary | ICD-10-CM

## 2020-06-16 DIAGNOSIS — E782 Mixed hyperlipidemia: Secondary | ICD-10-CM

## 2020-06-16 DIAGNOSIS — Z96652 Presence of left artificial knee joint: Secondary | ICD-10-CM

## 2020-06-16 MED ORDER — AMPHETAMINE-DEXTROAMPHETAMINE 15 MG PO TABS: 15.0000 mg | ORAL_TABLET | Freq: Four times a day (QID) | ORAL | 0 refills | Status: DC

## 2020-06-16 NOTE — Patient Instructions (Signed)

## 2020-06-16 NOTE — Progress Notes (Signed)
I,Yamilka Roman [employer] as a Education administrator for [employer], FNP.,have documented all relevant documentation on the behalf of Minette Brine, FNP,as directed by  Minette Brine, FNP while in the presence of Minette Brine, [place]. This visit occurred during the SARS-CoV-2 public health emergency.  Safety protocols were in place, including screening questions prior to the visit, additional usage of staff PPE, and extensive cleaning of exam room while observing appropriate contact time as indicated for disinfecting solutions.  Subjective:     Patient ID: Steven Gibson , male    DOB: 15-Aug-1970 , 50 y.o.   MRN: [id number]   Chief Complaint  Patient presents with   ADHD   Hypertension    HPI  Patient presents today for a blood pressure and ADHD f/u.  He had his knee replacement to left knee on 05/08/2020 - he is currently not working, in PT.  He does not go back to work until April.  He reports he had a good amount of scar tissue from previous surgeries.    Hypertension This is a chronic problem. The current episode started more than 1 year ago. The problem is unchanged. The problem is controlled. Pertinent negatives include no anxiety, chest pain, headaches or palpitations. Risk factors for coronary artery disease include male gender, obesity and sedentary lifestyle. Past treatments include angiotensin blockers, diuretics and calcium channel blockers. There are no compliance problems.  There is no history of angina. There is no history of chronic renal disease.  Hyperlipidemia This is a chronic problem. The current episode started more than 1 year ago. The problem is controlled. Recent lipid tests were reviewed and are normal. He has no history of chronic renal disease. Pertinent negatives include no chest pain. There are no compliance problems.  Risk factors for coronary artery disease include male sex and obesity.     Past Medical History:  Diagnosis Date   Arthritis    Graves disease     Hyperlipidemia    Hypertension      Family History  Problem Relation Age of Onset   Hypertension Mother    Hypertension Father    Hypertension Brother    Cancer Paternal Grandfather    Hypertension Brother      Current Outpatient Medications:    amLODipine (NORVASC) 10 MG tablet, TAKE ONE TABLET BY MOUTH DAILY, Disp: 90 tablet, Rfl: 1   aspirin EC 325 MG tablet, Take 1 tablet (325 mg total) by mouth 2 (two) times daily., Disp: 60 tablet, Rfl: 0   baclofen (LIORESAL) 10 MG tablet, Take 1 tablet (10 mg total) by mouth 3 (three) times daily. As needed for muscle spasm, Disp: 50 tablet, Rfl: 0   escitalopram (LEXAPRO) 20 MG tablet, Take 1 tablet (20 mg total) by mouth daily., Disp: 90 tablet, Rfl: 1   lisinopril-hydrochlorothiazide (ZESTORETIC) 20-25 MG tablet, TAKE 1 TABLET BY MOUTH DAILY, Disp: 90 tablet, Rfl: 1   ondansetron (ZOFRAN) 4 MG tablet, Take 1 tablet (4 mg total) by mouth every 8 (eight) hours as needed for nausea or vomiting., Disp: 10 tablet, Rfl: 0   oxyCODONE (ROXICODONE) 5 MG immediate release tablet, Take 1 tablet (5 mg total) by mouth every 4 (four) hours as needed for severe pain., Disp: 30 tablet, Rfl: 0   pravastatin (PRAVACHOL) 20 MG tablet, Take 1 tablet (20 mg total) by mouth daily., Disp: 90 tablet, Rfl: 1   sennosides-docusate sodium (SENOKOT-S) 8.6-50 MG tablet, Take 2 tablets by mouth daily., Disp: 30 tablet, Rfl: 1  amphetamine-dextroamphetamine (ADDERALL) 15 MG tablet, Take 1 tablet by mouth 4 (four) times daily., Disp: 120 tablet, Rfl: 0   icosapent Ethyl (VASCEPA) 1 g capsule, Take 2 capsules (2 g total) by mouth 2 (two) times daily. (Patient not taking: Reported on 06/16/2020), Disp: 120 capsule, Rfl: 1   No Known Allergies   Review of Systems  Constitutional: Negative.   Respiratory: Negative.   Cardiovascular: Negative for chest pain and palpitations.  Musculoskeletal:       Healing from left knee surgery  Neurological: Negative  for dizziness and headaches.  Psychiatric/Behavioral: Negative.      Today's Vitals   06/16/20 1004  BP: 122/76  Pulse: 92  Temp: 98.6 F (37 C)  TempSrc: Oral  Weight: 224 lb 6.4 oz (101.8 kg)  Height: '6\' 1"'  (1.854 m)  PainSc: 0-No pain   Body mass index is 29.61 kg/m.   Objective:  Physical Exam Vitals reviewed.  Constitutional:      General: He is not in acute distress.    Appearance: Normal appearance.  Cardiovascular:     Rate and Rhythm: Normal rate and regular rhythm.     Pulses: Normal pulses.     Heart sounds: Normal heart sounds. No murmur heard.   Pulmonary:     Effort: Pulmonary effort is normal. No respiratory distress.     Breath sounds: Normal breath sounds. No wheezing.  Neurological:     General: No focal deficit present.     Mental Status: He is alert and oriented to person, place, and time.     Cranial Nerves: No cranial nerve deficit.     Motor: No weakness.  Psychiatric:        Mood and Affect: Mood normal.        Behavior: Behavior normal.        Thought Content: Thought content normal.        Judgment: Judgment normal.         Assessment And Plan:     1. Essential hypertension  B/P is well controlled.   CMP ordered to check renal function.   The importance of regular exercise and dietary modification was stressed to the patient.   Stressed importance of losing ten percent of her body weight to help with B/P control.  - CMP14+EGFR; Future  2. Attention deficit disorder, unspecified hyperactivity presence  Chronic, doing well  Has not needed as much as he is currently out of work after having knee surgery - amphetamine-dextroamphetamine (ADDERALL) 15 MG tablet; Take 1 tablet by mouth 4 (four) times daily.  Dispense: 120 tablet; Refill: 0  3. Mixed hyperlipidemia  Chronic, controlled  Continue with current medications, tolerating medications well - CMP14+EGFR; Future  4. Hypertriglyceridemia  Will check lipid panel  Has  cut back on fried and fatty foods - Lipid panel; Future  5. History of left knee replacement Had knee surgery in December    Patient was given opportunity to ask questions. Patient verbalized understanding of the plan and was able to repeat key elements of the plan. All questions were answered to their satisfaction.  Minette Brine, FNP   I, Minette Brine, FNP, have reviewed all documentation for this visit. The documentation on 07/07/20 for the exam, diagnosis, procedures, and orders are all accurate and complete.   THE PATIENT IS ENCOURAGED TO PRACTICE SOCIAL DISTANCING DUE TO THE COVID-19 PANDEMIC.

## 2020-07-22 LAB — HEPATIC FUNCTION PANEL
ALT: 34 (ref 10–40)
AST: 39 (ref 14–40)
Alkaline Phosphatase: 184 — AB (ref 25–125)
Bilirubin, Direct: 0.1 (ref 0.01–0.4)
Bilirubin, Total: 0.2

## 2020-07-22 LAB — COMPREHENSIVE METABOLIC PANEL: Albumin: 4.3 (ref 3.5–5.0)

## 2020-07-24 ENCOUNTER — Encounter: Payer: Self-pay | Admitting: Nurse Practitioner

## 2020-08-07 ENCOUNTER — Encounter: Payer: Self-pay | Admitting: Nurse Practitioner

## 2020-08-11 ENCOUNTER — Other Ambulatory Visit: Payer: Self-pay | Admitting: Nurse Practitioner

## 2020-08-11 DIAGNOSIS — F988 Other specified behavioral and emotional disorders with onset usually occurring in childhood and adolescence: Secondary | ICD-10-CM

## 2020-08-11 MED ORDER — AMPHETAMINE-DEXTROAMPHETAMINE 15 MG PO TABS: 15.0000 mg | ORAL_TABLET | Freq: Four times a day (QID) | ORAL | 0 refills | Status: DC

## 2020-08-11 NOTE — Telephone Encounter (Signed)
Adderall refill 

## 2020-08-13 ENCOUNTER — Encounter: Payer: Self-pay | Admitting: Nurse Practitioner

## 2020-08-13 ENCOUNTER — Ambulatory Visit (INDEPENDENT_AMBULATORY_CARE_PROVIDER_SITE_OTHER): Payer: Managed Care, Other (non HMO) | Admitting: Nurse Practitioner

## 2020-08-13 ENCOUNTER — Other Ambulatory Visit: Payer: Self-pay

## 2020-08-13 VITALS — BP 134/82 | HR 80 | Temp 98.1°F | Ht 73.0 in | Wt 225.8 lb

## 2020-08-13 DIAGNOSIS — F988 Other specified behavioral and emotional disorders with onset usually occurring in childhood and adolescence: Secondary | ICD-10-CM

## 2020-08-13 DIAGNOSIS — Z0000 Encounter for general adult medical examination without abnormal findings: Secondary | ICD-10-CM

## 2020-08-13 DIAGNOSIS — R7303 Prediabetes: Secondary | ICD-10-CM | POA: Diagnosis not present

## 2020-08-13 DIAGNOSIS — Z139 Encounter for screening, unspecified: Secondary | ICD-10-CM

## 2020-08-13 DIAGNOSIS — E782 Mixed hyperlipidemia: Secondary | ICD-10-CM | POA: Diagnosis not present

## 2020-08-13 DIAGNOSIS — Z125 Encounter for screening for malignant neoplasm of prostate: Secondary | ICD-10-CM

## 2020-08-13 DIAGNOSIS — I10 Essential (primary) hypertension: Secondary | ICD-10-CM | POA: Diagnosis not present

## 2020-08-13 DIAGNOSIS — E663 Overweight: Secondary | ICD-10-CM

## 2020-08-13 LAB — POCT URINALYSIS DIPSTICK
Bilirubin, UA: NEGATIVE
Blood, UA: NEGATIVE
Glucose, UA: NEGATIVE
Leukocytes, UA: NEGATIVE
Nitrite, UA: NEGATIVE
Protein, UA: NEGATIVE
Spec Grav, UA: >=1.03 — AB (ref 1.010–1.025)
Urobilinogen, UA: 0.2 E.U./dL
pH, UA: 6 (ref 5.0–8.0)

## 2020-08-13 LAB — POCT UA - MICROALBUMIN
Creatinine, POC: 300 mg/dL
Microalbumin Ur, POC: 80 mg/L

## 2020-08-13 MED ORDER — PRAVASTATIN SODIUM 20 MG PO TABS: 20.0000 mg | ORAL_TABLET | Freq: Every day | ORAL | 1 refills | Status: DC

## 2020-08-13 NOTE — Progress Notes (Signed)
Rutherford Nail as a scribe for Minette Brine, FNP.,have documented all relevant documentation on the behalf of Minette Brine, FNP,as directed by  Minette Brine, FNP while in the presence of Minette Brine, [place].   This visit occurred during the SARS-CoV-2 public health emergency. Safety protocols were in place, including screening questions prior to the visit, additional usage of staff PPE, and extensive cleaning of exam room while observing appropriate contact time as indicated for disinfecting solutions.  Subjective:     Patient ID: Steven Gibson , male    DOB: 13-May-1971 , 50 y.o.   MRN: [id number]   Chief Complaint  Patient presents with  . Annual Exam    HPI  Here for HM he is compliant with all medications and has no other complants today.   He is back at work from his knee surgery 1/2 day until after his follow up on 4/16.  He has his colonoscopy scheduled for 4/4  Wt Readings from Last 3 Encounters: 08/13/20 : 225 lb 12.8 oz (102.4 kg) 06/16/20 : 224 lb 6.4 oz (101.8 kg) 05/06/20 : 216 lb (98 kg)  4 of his maternal uncles has a history of prostate cancer, father with history of prostate cancer.       Past Medical History:  Diagnosis Date  . Arthritis   . Graves disease   . Hyperlipidemia   . Hypertension      Family History  Problem Relation Age of Onset  . Hypertension Mother   . Hypertension Father   . Hypertension Brother   . Cancer Paternal Grandfather   . Hypertension Brother      Current Outpatient Medications:  .  amLODipine (NORVASC) 10 MG tablet, TAKE ONE TABLET BY MOUTH DAILY, Disp: 90 tablet, Rfl: 1 .  amphetamine-dextroamphetamine (ADDERALL) 15 MG tablet, Take 1 tablet by mouth 4 (four) times daily., Disp: 120 tablet, Rfl: 0 .  escitalopram (LEXAPRO) 20 MG tablet, Take 1 tablet (20 mg total) by mouth daily., Disp: 90 tablet, Rfl: 1 .  lisinopril-hydrochlorothiazide (ZESTORETIC) 20-25 MG tablet, TAKE 1 TABLET BY MOUTH DAILY, Disp:  90 tablet, Rfl: 1 .  pravastatin (PRAVACHOL) 20 MG tablet, Take 1 tablet (20 mg total) by mouth daily., Disp: 90 tablet, Rfl: 1   No Known Allergies   Men's preventive visit. Patient Health Questionnaire (PHQ-2) is  [place] Office Visit from 08/13/2020 in [name]  PHQ-2 Total Score 0     Patient is on a regular diet; trying to cut back on his sweets and eating more fruit.  He is exercising at the gym, mostly working on building the muscle back up in his leg.  Marital status: Married. Relevant history for alcohol use is:  Social History   Substance and Sexual Activity  Alcohol Use Yes   Comment: occasional   Relevant history for tobacco use is:  Social History   Tobacco Use  Smoking Status Current Some Day Smoker  . Packs/day: 0.25  . Years: 20.00  . Pack years: 5.00  . Types: Cigarettes  Smokeless Tobacco Former Systems developer  Tobacco Comment   6 cigarette a day; currently smoking 1/2 PPD - 3/30    Review of Systems  Constitutional: Negative.  Negative for fatigue.  HENT: Negative.        Nasal congestion  Eyes: Negative.   Respiratory: Negative.   Cardiovascular: Negative.  Negative for chest pain, palpitations and leg swelling.  Gastrointestinal: Negative.   Endocrine: Negative.  Negative for polydipsia,  polyphagia and polyuria.  Genitourinary: Positive for urgency.  Musculoskeletal: Negative.   Skin: Negative.   Allergic/Immunologic: Negative.   Neurological: Negative.  Negative for dizziness and headaches.  Hematological: Negative.   Psychiatric/Behavioral: Negative.      Today's Vitals   08/13/20 0858  BP: 134/82  Pulse: 80  Temp: 98.1 F (36.7 C)  TempSrc: Oral  Weight: 225 lb 12.8 oz (102.4 kg)  Height: '6\' 1"'  (1.854 m)   Body mass index is 29.79 kg/m.  Wt Readings from Last 3 Encounters:  08/13/20 225 lb 12.8 oz (102.4 kg)  06/16/20 224 lb 6.4 oz (101.8 kg)  05/06/20 216 lb (98 kg)   Objective:  Physical Exam Vitals  reviewed.  Constitutional:      General: He is not in acute distress.    Appearance: Normal appearance. He is obese.  HENT:     Head: Normocephalic.     Right Ear: Tympanic membrane, ear canal and external ear normal. There is no impacted cerumen.     Left Ear: Tympanic membrane, ear canal and external ear normal. There is no impacted cerumen.     Nose: No congestion.     Comments: Deferred - masked    Mouth/Throat:     Comments: Deferred - masked Eyes:     Extraocular Movements: Extraocular movements intact.     Conjunctiva/sclera: Conjunctivae normal.     Pupils: Pupils are equal, round, and reactive to light.  Cardiovascular:     Rate and Rhythm: Normal rate and regular rhythm.     Pulses: Normal pulses.     Heart sounds: Normal heart sounds. No murmur heard.   Pulmonary:     Effort: Pulmonary effort is normal. No respiratory distress.     Breath sounds: Normal breath sounds. No wheezing.  Abdominal:     General: Abdomen is flat. Bowel sounds are normal. There is no distension.     Palpations: Abdomen is soft.     Tenderness: There is no abdominal tenderness.  Musculoskeletal:        General: No swelling or tenderness. Normal range of motion.     Cervical back: Normal range of motion and neck supple. No rigidity.  Skin:    General: Skin is warm and dry.     Capillary Refill: Capillary refill takes less than 2 seconds.  Neurological:     General: No focal deficit present.     Mental Status: He is alert and oriented to person, place, and time.     Cranial Nerves: No cranial nerve deficit.     Sensory: No sensory deficit.     Motor: No weakness.  Psychiatric:        Mood and Affect: Mood normal.        Behavior: Behavior normal.        Thought Content: Thought content normal.        Judgment: Judgment normal.         Assessment And Plan:    1. Health maintenance examination . Behavior modifications discussed and diet history reviewed.   . Pt will continue to  exercise regularly and modify diet with low GI, plant based foods and decrease intake of processed foods.  . Recommend intake of daily multivitamin, Vitamin D, and calcium.  . Recommend mammogram and colonoscopy for preventive screenings, as well as recommend immunizations that include influenza, TDAP, and shingles  - CBC  2. Essential hypertension  Chronic, fair control  Continue with current medications, tolerating well - POCT  Urinalysis Dipstick (81002) - POCT UA - Microalbumin - CMP14+EGFR  3. Mixed hyperlipidemia  Chronic, triglycerides are improving slowly  Continue with pravastatin and following a low fat diet - CMP14+EGFR - Lipid panel - pravastatin (PRAVACHOL) 20 MG tablet; Take 1 tablet (20 mg total) by mouth daily.  Dispense: 90 tablet; Refill: 1  4. Prediabetes  Chronic, controlled  Continue with current medications  Encouraged to limit intake of sugary foods and drinks  Encouraged to increase physical activity to 150 minutes per week - Hemoglobin A1c  5. Attention deficit disorder, unspecified hyperactivity presence  Chronic, stable  Refill for adderall has been sent  6. Encounter for prostate cancer screening  Digital prostate exam done, no abnormal findings - PSA  8. Overweight (BMI 25.0-29.9)  He is encouraged to initially strive for BMI less than 30 to decrease cardiac risk. He is advised to exercise no less than 150 minutes per week.    Patient was given opportunity to ask questions. Patient verbalized understanding of the plan and was able to repeat key elements of the plan. All questions were answered to their satisfaction.   Minette Brine, FNP   I, Minette Brine, FNP, have reviewed all documentation for this visit. The documentation on 08/13/20 for the exam, diagnosis, procedures, and orders are all accurate and complete.   THE PATIENT IS ENCOURAGED TO PRACTICE SOCIAL DISTANCING DUE TO THE COVID-19 PANDEMIC.

## 2020-08-13 NOTE — Patient Instructions (Signed)
Hypertension, Adult Hypertension is another name for high blood pressure. High blood pressure forces your heart to work harder to pump blood. This can cause problems over time. There are two numbers in a blood pressure reading. There is a top number (systolic) over a bottom number (diastolic). It is best to have a blood pressure that is below 120/80. Healthy choices can help lower your blood pressure, or you may need medicine to help lower it. What are the causes? The cause of this condition is not known. Some conditions may be related to high blood pressure. What increases the risk?  Smoking.  Having type 2 diabetes mellitus, high cholesterol, or both.  Not getting enough exercise or physical activity.  Being overweight.  Having too much fat, sugar, calories, or salt (sodium) in your diet.  Drinking too much alcohol.  Having long-term (chronic) kidney disease.  Having a family history of high blood pressure.  Age. Risk increases with age.  Race. You may be at higher risk if you are African American.  Gender. Men are at higher risk than women before age 45. After age 65, women are at higher risk than men.  Having obstructive sleep apnea.  Stress. What are the signs or symptoms?  High blood pressure may not cause symptoms. Very high blood pressure (hypertensive crisis) may cause: ? Headache. ? Feelings of worry or nervousness (anxiety). ? Shortness of breath. ? Nosebleed. ? A feeling of being sick to your stomach (nausea). ? Throwing up (vomiting). ? Changes in how you see. ? Very bad chest pain. ? Seizures. How is this treated?  This condition is treated by making healthy lifestyle changes, such as: ? Eating healthy foods. ? Exercising more. ? Drinking less alcohol.  Your health care provider may prescribe medicine if lifestyle changes are not enough to get your blood pressure under control, and if: ? Your top number is above 130. ? Your bottom number is above  80.  Your personal target blood pressure may vary. Follow these instructions at home: Eating and drinking  If told, follow the DASH eating plan. To follow this plan: ? Fill one half of your plate at each meal with fruits and vegetables. ? Fill one fourth of your plate at each meal with whole grains. Whole grains include whole-wheat pasta, brown rice, and whole-grain bread. ? Eat or drink low-fat dairy products, such as skim milk or low-fat yogurt. ? Fill one fourth of your plate at each meal with low-fat (lean) proteins. Low-fat proteins include fish, chicken without skin, eggs, beans, and tofu. ? Avoid fatty meat, cured and processed meat, or chicken with skin. ? Avoid pre-made or processed food.  Eat less than 1,500 mg of salt each day.  Do not drink alcohol if: ? Your doctor tells you not to drink. ? You are pregnant, may be pregnant, or are planning to become pregnant.  If you drink alcohol: ? Limit how much you use to:  0-1 drink a day for women.  0-2 drinks a day for men. ? Be aware of how much alcohol is in your drink. In the [place] one drink equals one 12 oz bottle of beer (355 mL), one 5 oz glass of wine (148 mL), or one 1 oz glass of hard liquor (44 mL).   Lifestyle  Work with your doctor to stay at a healthy weight or to lose weight. Ask your doctor what the best weight is for you.  Get at least 30 minutes of exercise most   days of the week. This may include walking, swimming, or biking.  Get at least 30 minutes of exercise that strengthens your muscles (resistance exercise) at least 3 days a week. This may include lifting weights or doing Pilates.  Do not use any products that contain nicotine or tobacco, such as cigarettes, e-cigarettes, and chewing tobacco. If you need help quitting, ask your doctor.  Check your blood pressure at home as told by your doctor.  Keep all follow-up visits as told by your doctor. This is important.   Medicines  Take over-the-counter  and prescription medicines only as told by your doctor. Follow directions carefully.  Do not skip doses of blood pressure medicine. The medicine does not work as well if you skip doses. Skipping doses also puts you at risk for problems.  Ask your doctor about side effects or reactions to medicines that you should watch for. Contact a doctor if you:  Think you are having a reaction to the medicine you are taking.  Have headaches that keep coming back (recurring).  Feel dizzy.  Have swelling in your ankles.  Have trouble with your vision. Get help right away if you:  Get a very bad headache.  Start to feel mixed up (confused).  Feel weak or numb.  Feel faint.  Have very bad pain in your: ? Chest. ? Belly (abdomen).  Throw up more than once.  Have trouble breathing. Summary  Hypertension is another name for high blood pressure.  High blood pressure forces your heart to work harder to pump blood.  For most people, a normal blood pressure is less than 120/80.  Making healthy choices can help lower blood pressure. If your blood pressure does not get lower with healthy choices, you may need to take medicine. This information is not intended to replace advice given to you by your health care provider. Make sure you discuss any questions you have with your health care provider. Document Revised: 01/11/2018 Document Reviewed: 01/11/2018 Elsevier Patient Education  2021 [employer]. Health Maintenance, Male Adopting a healthy lifestyle and getting preventive care are important in promoting health and wellness. Ask your health care provider about:  The right schedule for you to have regular tests and exams.  Things you can do on your own to prevent diseases and keep yourself healthy. What should I know about diet, weight, and exercise? Eat a healthy diet  Eat a diet that includes plenty of vegetables, fruits, low-fat dairy products, and lean protein.  Do not eat a lot of  foods that are high in solid fats, added sugars, or sodium.   Maintain a healthy weight Body mass index (BMI) is a measurement that can be used to identify possible weight problems. It estimates body fat based on height and weight. Your health care provider can help determine your BMI and help you achieve or maintain a healthy weight. Get regular exercise Get regular exercise. This is one of the most important things you can do for your health. Most adults should:  Exercise for at least 150 minutes each week. The exercise should increase your heart rate and make you sweat (moderate-intensity exercise).  Do strengthening exercises at least twice a week. This is in addition to the moderate-intensity exercise.  Spend less time sitting. Even light physical activity can be beneficial. Watch cholesterol and blood lipids Have your blood tested for lipids and cholesterol at 51 years of age, then have this test every 5 years. You may need to have your   cholesterol levels checked more often if:  Your lipid or cholesterol levels are high.  You are older than 51 years of age.  You are at high risk for heart disease. What should I know about cancer screening? Many types of cancers can be detected early and may often be prevented. Depending on your health history and family history, you may need to have cancer screening at various ages. This may include screening for:  Colorectal cancer.  Prostate cancer.  Skin cancer.  Lung cancer. What should I know about heart disease, diabetes, and high blood pressure? Blood pressure and heart disease  High blood pressure causes heart disease and increases the risk of stroke. This is more likely to develop in people who have high blood pressure readings, are of African descent, or are overweight.  Talk with your health care provider about your target blood pressure readings.  Have your blood pressure checked: ? Every 3-5 years if you are 18-39 years of  age. ? Every year if you are 40 years old or older.  If you are between the ages of 65 and 75 and are a current or former smoker, ask your health care provider if you should have a one-time screening for abdominal aortic aneurysm (AAA). Diabetes Have regular diabetes screenings. This checks your fasting blood sugar level. Have the screening done:  Once every three years after age 45 if you are at a normal weight and have a low risk for diabetes.  More often and at a younger age if you are overweight or have a high risk for diabetes. What should I know about preventing infection? Hepatitis B If you have a higher risk for hepatitis B, you should be screened for this virus. Talk with your health care provider to find out if you are at risk for hepatitis B infection. Hepatitis C Blood testing is recommended for:  Everyone born from 1945 through 1965.  Anyone with known risk factors for hepatitis C. Sexually transmitted infections (STIs)  You should be screened each year for STIs, including gonorrhea and chlamydia, if: ? You are sexually active and are younger than 51 years of age. ? You are older than 51 years of age and your health care provider tells you that you are at risk for this type of infection. ? Your sexual activity has changed since you were last screened, and you are at increased risk for chlamydia or gonorrhea. Ask your health care provider if you are at risk.  Ask your health care provider about whether you are at high risk for HIV. Your health care provider may recommend a prescription medicine to help prevent HIV infection. If you choose to take medicine to prevent HIV, you should first get tested for HIV. You should then be tested every 3 months for as long as you are taking the medicine. Follow these instructions at home: Lifestyle  Do not use any products that contain nicotine or tobacco, such as cigarettes, e-cigarettes, and chewing tobacco. If you need help quitting,  ask your health care provider.  Do not use street drugs.  Do not share needles.  Ask your health care provider for help if you need support or information about quitting drugs. Alcohol use  Do not drink alcohol if your health care provider tells you not to drink.  If you drink alcohol: ? Limit how much you have to 0-2 drinks a day. ? Be aware of how much alcohol is in your drink. In the [place] one drink   equals one 12 oz bottle of beer (355 mL), one 5 oz glass of wine (148 mL), or one 1 oz glass of hard liquor (44 mL). General instructions  Schedule regular health, dental, and eye exams.  Stay current with your vaccines.  Tell your health care provider if: ? You often feel depressed. ? You have ever been abused or do not feel safe at home. Summary  Adopting a healthy lifestyle and getting preventive care are important in promoting health and wellness.  Follow your health care provider's instructions about healthy diet, exercising, and getting tested or screened for diseases.  Follow your health care provider's instructions on monitoring your cholesterol and blood pressure. This information is not intended to replace advice given to you by your health care provider. Make sure you discuss any questions you have with your health care provider. Document Revised: 04/26/2018 Document Reviewed: 04/26/2018 Elsevier Patient Education  2021 Elsevier Inc.  

## 2020-08-14 LAB — CMP14+EGFR
ALT: 21 IU/L (ref 0–44)
AST: 26 IU/L (ref 0–40)
Albumin/Globulin Ratio: 1.2 (ref 1.2–2.2)
Albumin: 4.2 g/dL (ref 4.0–5.0)
Alkaline Phosphatase: 160 IU/L — ABNORMAL HIGH (ref 44–121)
BUN/Creatinine Ratio: 10 (ref 9–20)
BUN: 10 mg/dL (ref 6–24)
Bilirubin Total: <0.2 mg/dL (ref 0.0–1.2)
CO2: 22 mmol/L (ref 20–29)
Calcium: 9.7 mg/dL (ref 8.7–10.2)
Chloride: 104 mmol/L (ref 96–106)
Creatinine, Ser: 0.98 mg/dL (ref 0.76–1.27)
Globulin, Total: 3.4 g/dL (ref 1.5–4.5)
Glucose: 93 mg/dL (ref 65–99)
Potassium: 4.3 mmol/L (ref 3.5–5.2)
Sodium: 141 mmol/L (ref 134–144)
Total Protein: 7.6 g/dL (ref 6.0–8.5)
eGFR: 94 mL/min/{1.73_m2} (ref >59)

## 2020-08-14 LAB — CBC
Hematocrit: 43.2 % (ref 37.5–51.0)
Hemoglobin: 14.4 g/dL (ref 13.0–17.7)
MCH: 27.7 pg (ref 26.6–33.0)
MCHC: 33.3 g/dL (ref 31.5–35.7)
MCV: 83 fL (ref 79–97)
Platelets: 358 10*3/uL (ref 150–450)
RBC: 5.19 x10E6/uL (ref 4.14–5.80)
RDW: 14 % (ref 11.6–15.4)
WBC: 6.2 10*3/uL (ref 3.4–10.8)

## 2020-08-14 LAB — HEMOGLOBIN A1C
Est. average glucose Bld gHb Est-mCnc: 117 mg/dL
Hgb A1c MFr Bld: 5.7 % — ABNORMAL HIGH (ref 4.8–5.6)

## 2020-08-14 LAB — PSA: Prostate Specific Ag, Serum: 1.4 ng/mL (ref 0.0–4.0)

## 2020-08-14 LAB — LIPID PANEL
Chol/HDL Ratio: 3.7 ratio (ref 0.0–5.0)
Cholesterol, Total: 194 mg/dL (ref 100–199)
HDL: 53 mg/dL (ref >39)
LDL Chol Calc (NIH): 94 mg/dL (ref 0–99)
Triglycerides: 282 mg/dL — ABNORMAL HIGH (ref 0–149)
VLDL Cholesterol Cal: 47 mg/dL — ABNORMAL HIGH (ref 5–40)

## 2020-08-14 LAB — VARICELLA ZOSTER ANTIBODY, IGG: Varicella zoster IgG: 816 index (ref >165)

## 2020-08-18 ENCOUNTER — Encounter: Payer: Self-pay | Admitting: Nurse Practitioner

## 2020-08-18 LAB — HM COLONOSCOPY

## 2020-09-12 ENCOUNTER — Other Ambulatory Visit: Payer: Self-pay | Admitting: Nurse Practitioner

## 2020-09-12 DIAGNOSIS — F988 Other specified behavioral and emotional disorders with onset usually occurring in childhood and adolescence: Secondary | ICD-10-CM

## 2020-09-12 MED ORDER — AMPHETAMINE-DEXTROAMPHETAMINE 15 MG PO TABS: 15.0000 mg | ORAL_TABLET | Freq: Four times a day (QID) | ORAL | 0 refills | Status: DC

## 2020-09-12 NOTE — Telephone Encounter (Signed)
adderall

## 2020-09-12 NOTE — Telephone Encounter (Signed)
Refill of adderall

## 2020-09-15 ENCOUNTER — Ambulatory Visit: Payer: Managed Care, Other (non HMO) | Admitting: Nurse Practitioner

## 2020-09-24 ENCOUNTER — Ambulatory Visit: Payer: Managed Care, Other (non HMO) | Admitting: Nurse Practitioner

## 2020-10-09 ENCOUNTER — Other Ambulatory Visit: Payer: Self-pay | Admitting: Nurse Practitioner

## 2020-10-09 DIAGNOSIS — F988 Other specified behavioral and emotional disorders with onset usually occurring in childhood and adolescence: Secondary | ICD-10-CM

## 2020-10-13 MED ORDER — AMPHETAMINE-DEXTROAMPHETAMINE 15 MG PO TABS
15.0000 mg | ORAL_TABLET | Freq: Four times a day (QID) | ORAL | 0 refills | Status: DC
Start: 2020-10-13 — End: 2020-11-12

## 2020-10-16 NOTE — H&P (Signed)
PREOPERATIVE H&P  Chief Complaint: left knee stiffness   HPI: Steven Gibson is a 50 y.o. 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 He has had relief of pain after surgery but is still struggling with motion. He would like to get at least another 15-20 degress of motion. He feels like his lack of range of motion  is significantly impairing activities of daily living. He has elected for a left knee manipulation under anesthesia.   Past Medical History:  Diagnosis Date  . Arthritis   . Graves disease   . Hyperlipidemia   . Hypertension    Past Surgical History:  Procedure Laterality Date  . ANTERIOR CRUCIATE LIGAMENT REPAIR     PCL and MCL  . HERNIA REPAIR N/A    adb  . TOTAL KNEE ARTHROPLASTY Left 05/06/2020   Procedure: TOTAL KNEE ARTHROPLASTY;  Surgeon: Teryl Lucy, MD;  Location: [facility];  Service: Orthopedics;  Laterality: Left;   Social History   Socioeconomic History  . Marital status: Married    Spouse name: Not on file  . Number of children: Not on file  . Years of education: Not on file  . Highest education level: Not on file  Occupational History  . Not on file  Tobacco Use  . Smoking status: Current Some Day Smoker    Packs/day: 0.25    Years: 20.00    Pack years: 5.00    Types: Cigarettes  . Smokeless tobacco: Former Neurosurgeon  . Tobacco comment: 6 cigarette a day; currently smoking 1/2 PPD - 3/30  Vaping Use  . Vaping Use: Never used  Substance and Sexual Activity  . Alcohol use: Yes    Comment: occasional  . Drug use: Never  . Sexual activity: Not Currently  Other Topics Concern  . Not on file  Social History Narrative   ** Merged History Encounter **       Social Determinants of Health   Financial Resource Strain: Not on file  Food Insecurity: Not on file  Transportation Needs: Not on file  Physical Activity: Not on file  Stress: Not on file   Social Connections: Not on file   Family History  Problem Relation Age of Onset  . Hypertension Mother   . Hypertension Father   . Hypertension Brother   . Cancer Paternal Grandfather   . Hypertension Brother    No Known Allergies Prior to Admission medications   Medication Sig Start Date End Date Taking? Authorizing Provider  amLODipine (NORVASC) 10 MG tablet TAKE ONE TABLET BY MOUTH DAILY 05/12/20   [name], FNP  amphetamine-dextroamphetamine (ADDERALL) 15 MG tablet Take 1 tablet by mouth 4 (four) times daily. 10/13/20   [name], FNP  escitalopram (LEXAPRO) 20 MG tablet Take 1 tablet (20 mg total) by mouth daily. 04/08/20   [name], FNP  lisinopril-hydrochlorothiazide (ZESTORETIC) 20-25 MG tablet TAKE 1 TABLET BY MOUTH DAILY 05/12/20   [name], FNP  pravastatin (PRAVACHOL) 20 MG tablet Take 1 tablet (20 mg total) by mouth daily. 08/13/20   [name], FNP     Positive ROS: All other systems have been reviewed and were otherwise negative with the exception of those mentioned in the HPI and as above.  Physical Exam: General: Alert, no acute distress Cardiovascular: No pedal edema Respiratory: No cyanosis, no use of accessory musculature GI: No organomegaly, abdomen is soft and non-tender Skin: No lesions in the  area of chief complaint Neurologic: Sensation intact distally Psychiatric: Patient is competent for consent with normal mood and affect Lymphatic: No axillary or cervical lymphadenopathy  MUSCULOSKELETAL: On examination his surgical wounds have healed well. He has no significant effusion but he definitely has some stiffness and tightness. The range of motion is from about 10-90 degrees.   Assessment: Left knee arthrofibrosis, status post total knee replacement.    Plan: Plan for Procedure(s): CLOSED MANIPULATION LEFT KNEE  The risks benefits and alternatives were discussed with the patient including but not limited to the risks of  nonoperative treatment, versus surgical intervention including infection, bleeding, nerve injury,  blood clots, cardiopulmonary complications, morbidity, mortality, among others, and they were willing to proceed.    Patient's anticipated LOS is less than 2 midnights, meeting these requirements: - Younger than 33 - Lives within 1 hour of care - Has a competent adult at home to recover with post-op recover - NO history of  - Chronic pain requiring opiods  - Diabetes  - Coronary Artery Disease  - Heart failure  - Heart attack  - Stroke  - DVT/VTE  - Cardiac arrhythmia  - Respiratory Failure/COPD  - Renal failure  - Anemia  - Advanced Liver disease   Armida Sans, PA-C    10/16/2020 2:13 PM

## 2020-10-20 ENCOUNTER — Other Ambulatory Visit: Payer: Self-pay

## 2020-10-20 ENCOUNTER — Encounter (HOSPITAL_BASED_OUTPATIENT_CLINIC_OR_DEPARTMENT_OTHER): Payer: Self-pay | Admitting: Orthopedic Surgery

## 2020-10-20 NOTE — Progress Notes (Signed)
Spoke w/ via phone for pre-op interview--- Pt Lab needs dos---- Istat              Lab results------ current ekg in epic/ chart COVID test -----patient states asymptomatic no test needed Arrive at -------  0645 on 10-21-2020 NPO after MN NO Solid Food.  Clear liquids from MN until--- 0545 Med rec completed Medications to take morning of surgery ----- Norvasc Diabetic medication ----- n/a Patient instructed no nail polish to be worn day of surgery Patient instructed to bring photo id and insurance card day of surgery Patient aware to have Driver (ride ) / caregiver    for 24 hours after surgery-- wife, Steven Gibson Patient Special Instructions ----- n/a Pre-Op special Istructions ----- n/a Patient verbalized understanding of instructions that were given at this phone interview. Patient denies shortness of breath, chest pain, fever, cough at this phone interview.

## 2020-10-21 ENCOUNTER — Encounter (HOSPITAL_BASED_OUTPATIENT_CLINIC_OR_DEPARTMENT_OTHER)
Admission: RE | Disposition: A | Discharge status: Home / Self Care | Payer: Self-pay | Source: Home / Self Care | Attending: Orthopedic Surgery

## 2020-10-21 ENCOUNTER — Ambulatory Visit (HOSPITAL_BASED_OUTPATIENT_CLINIC_OR_DEPARTMENT_OTHER): Payer: Managed Care, Other (non HMO) | Admitting: Anesthesiology

## 2020-10-21 ENCOUNTER — Ambulatory Visit (HOSPITAL_BASED_OUTPATIENT_CLINIC_OR_DEPARTMENT_OTHER)
Admission: RE | Admit: 2020-10-21 | Discharge: 2020-10-21 | Disposition: A | Discharge status: Home / Self Care | Payer: Managed Care, Other (non HMO) | Attending: Orthopedic Surgery | Admitting: Orthopedic Surgery

## 2020-10-21 ENCOUNTER — Encounter (HOSPITAL_BASED_OUTPATIENT_CLINIC_OR_DEPARTMENT_OTHER): Payer: Self-pay | Admitting: Orthopedic Surgery

## 2020-10-21 DIAGNOSIS — F17210 Nicotine dependence, cigarettes, uncomplicated: Secondary | ICD-10-CM | POA: Insufficient documentation

## 2020-10-21 DIAGNOSIS — M24662 Ankylosis, left knee: Secondary | ICD-10-CM | POA: Diagnosis present

## 2020-10-21 DIAGNOSIS — Z79899 Other long term (current) drug therapy: Secondary | ICD-10-CM | POA: Insufficient documentation

## 2020-10-21 DIAGNOSIS — Z96652 Presence of left artificial knee joint: Secondary | ICD-10-CM | POA: Diagnosis not present

## 2020-10-21 HISTORY — DX: Ankylosis, left knee: M24.662

## 2020-10-21 HISTORY — DX: Presence of spectacles and contact lenses: Z97.3

## 2020-10-21 HISTORY — PX: KNEE CLOSED REDUCTION: SHX995

## 2020-10-21 LAB — POCT I-STAT, CHEM 8
BUN: 13 mg/dL (ref 6–20)
Calcium, Ion: 1.2 mmol/L (ref 1.15–1.40)
Chloride: 101 mmol/L (ref 98–111)
Creatinine, Ser: 0.9 mg/dL (ref 0.61–1.24)
Glucose, Bld: 94 mg/dL (ref 70–99)
HCT: 47 % (ref 39.0–52.0)
Hemoglobin: 16 g/dL (ref 13.0–17.0)
Potassium: 3.5 mmol/L (ref 3.5–5.1)
Sodium: 139 mmol/L (ref 135–145)
TCO2: 26 mmol/L (ref 22–32)

## 2020-10-21 SURGERY — MANIPULATION, KNEE, CLOSED
Anesthesia: Regional | Site: Knee | Laterality: Left

## 2020-10-21 MED ORDER — MIDAZOLAM HCL 5 MG/5ML IJ SOLN
INTRAMUSCULAR | Status: DC | PRN
  Administered 2020-10-21: 2 mg via INTRAVENOUS

## 2020-10-21 MED ORDER — MORPHINE SULFATE (PF) 4 MG/ML IV SOLN
INTRAVENOUS | Status: AC
  Filled 2020-10-21: qty 1

## 2020-10-21 MED ORDER — DEXAMETHASONE SODIUM PHOSPHATE 10 MG/ML IJ SOLN
INTRAMUSCULAR | Status: AC
  Filled 2020-10-21: qty 1

## 2020-10-21 MED ORDER — FENTANYL CITRATE (PF) 100 MCG/2ML IJ SOLN
INTRAMUSCULAR | Status: AC
  Filled 2020-10-21: qty 2

## 2020-10-21 MED ORDER — LACTATED RINGERS IV SOLN: INTRAVENOUS | Status: DC

## 2020-10-21 MED ORDER — FENTANYL CITRATE (PF) 100 MCG/2ML IJ SOLN
25.0000 ug | INTRAMUSCULAR | Status: DC | PRN
  Administered 2020-10-21: 25 ug via INTRAVENOUS

## 2020-10-21 MED ORDER — FENTANYL CITRATE (PF) 100 MCG/2ML IJ SOLN
INTRAMUSCULAR | Status: DC | PRN
  Administered 2020-10-21 (×2): 50 ug via INTRAVENOUS

## 2020-10-21 MED ORDER — ONDANSETRON HCL 4 MG/2ML IJ SOLN
INTRAMUSCULAR | Status: AC
  Filled 2020-10-21: qty 2

## 2020-10-21 MED ORDER — ACETAMINOPHEN 500 MG PO TABS
1000.0000 mg | ORAL_TABLET | Freq: Once | ORAL | Status: AC
  Administered 2020-10-21: 1000 mg via ORAL

## 2020-10-21 MED ORDER — PROPOFOL 500 MG/50ML IV EMUL
INTRAVENOUS | Status: AC
  Filled 2020-10-21: qty 50

## 2020-10-21 MED ORDER — MIDAZOLAM HCL 2 MG/2ML IJ SOLN
INTRAMUSCULAR | Status: AC
  Filled 2020-10-21: qty 2

## 2020-10-21 MED ORDER — ONDANSETRON HCL 4 MG/2ML IJ SOLN
INTRAMUSCULAR | Status: DC | PRN
  Administered 2020-10-21: 4 mg via INTRAVENOUS

## 2020-10-21 MED ORDER — LIDOCAINE HCL (PF) 2 % IJ SOLN
INTRAMUSCULAR | Status: AC
  Filled 2020-10-21: qty 5

## 2020-10-21 MED ORDER — PROPOFOL 10 MG/ML IV BOLUS
INTRAVENOUS | Status: DC | PRN
  Administered 2020-10-21: 200 mg via INTRAVENOUS

## 2020-10-21 MED ORDER — LIDOCAINE 2% (20 MG/ML) 5 ML SYRINGE
INTRAMUSCULAR | Status: DC | PRN
  Administered 2020-10-21: 80 mg via INTRAVENOUS

## 2020-10-21 MED ORDER — ACETAMINOPHEN 500 MG PO TABS
ORAL_TABLET | ORAL | Status: AC
  Filled 2020-10-21: qty 2

## 2020-10-21 MED ORDER — OXYCODONE HCL 5 MG PO TABS: 5.0000 mg | ORAL_TABLET | Freq: Once | ORAL | Status: DC | PRN

## 2020-10-21 MED ORDER — OXYCODONE HCL 5 MG/5ML PO SOLN: 5.0000 mg | Freq: Once | ORAL | Status: DC | PRN

## 2020-10-21 MED ORDER — PROMETHAZINE HCL 25 MG/ML IJ SOLN: 6.2500 mg | INTRAMUSCULAR | Status: DC | PRN

## 2020-10-21 MED ORDER — DEXAMETHASONE SODIUM PHOSPHATE 10 MG/ML IJ SOLN
INTRAMUSCULAR | Status: DC | PRN
  Administered 2020-10-21: 10 mg via INTRAVENOUS

## 2020-10-21 SURGICAL SUPPLY — 10 items
BNDG ADH 1X3 SHEER STRL LF (GAUZE/BANDAGES/DRESSINGS) IMPLANT
BNDG ADH THN 3X1 STRL LF (GAUZE/BANDAGES/DRESSINGS)
GLOVE SURG ENC MOIS LTX SZ7 (GLOVE) IMPLANT
GLOVE SURG UNDER POLY LF SZ7 (GLOVE) IMPLANT
NDL SAFETY ECLIPSE 18X1.5 (NEEDLE) IMPLANT
NEEDLE HYPO 18GX1.5 SHARP (NEEDLE)
NEEDLE SPNL 22GX3.5 QUINCKE BK (NEEDLE) IMPLANT
PAD ALCOHOL SWAB (MISCELLANEOUS) IMPLANT
SYR 20ML LL LF (SYRINGE) IMPLANT
WRAP KNEE MAXI GEL POST OP (GAUZE/BANDAGES/DRESSINGS) ×2 IMPLANT

## 2020-10-21 NOTE — Anesthesia Postprocedure Evaluation (Signed)
Anesthesia Post Note  Patient: Steven Gibson  Procedure(s) Performed: CLOSED MANIPULATION LEFT KNEE (Left Knee)     Patient location during evaluation: PACU Anesthesia Type: General Level of consciousness: awake and alert Pain management: pain level controlled Vital Signs Assessment: post-procedure vital signs reviewed and stable Respiratory status: spontaneous breathing, nonlabored ventilation and respiratory function stable Cardiovascular status: blood pressure returned to baseline and stable Postop Assessment: no apparent nausea or vomiting Anesthetic complications: no   No complications documented.  Last Vitals:  Vitals:   10/21/20 0915 10/21/20 0930  BP: 123/81 120/84  Pulse: 65 66  Resp: 10 11  Temp:    SpO2: 100% 97%    Last Pain:  Vitals:   10/21/20 0930  TempSrc:   PainSc: 4                  Candra R Sima Lindenberger

## 2020-10-21 NOTE — Discharge Instructions (Signed)
Ok to [place] and resume activities as tolerated.  Ice as needed, resume ROM exercises as soon as comfortable.  OK to use tylenol and ibuprofen/motrin for pain if needed.     Post Anesthesia Home Care Instructions  Activity: Get plenty of rest for the remainder of the day. A responsible individual must stay with you for 24 hours following the procedure.  For the next 24 hours, DO NOT: -Drive a car -Advertising copywriter -Drink alcoholic beverages -Take any medication unless instructed by your physician -Make any legal decisions or sign important papers.  Meals: Start with liquid foods such as gelatin or soup. Progress to regular foods as tolerated. Avoid greasy, spicy, heavy foods. If nausea and/or vomiting occur, drink only clear liquids until the nausea and/or vomiting subsides. Call your physician if vomiting continues.  Special Instructions/Symptoms: Your throat may feel dry or sore from the anesthesia or the breathing tube placed in your throat during surgery. If this causes discomfort, gargle with warm salt water. The discomfort should disappear within 24 hours.  May take Tylenol starting at 1:30 Pm as needed for pain.

## 2020-10-21 NOTE — Anesthesia Procedure Notes (Signed)
Procedure Name: LMA Insertion Date/Time: 10/21/2020 8:39 AM Performed by: Pearson Grippe, CRNA Pre-anesthesia Checklist: Patient identified, Emergency Drugs available, Suction available and Patient being monitored Patient Re-evaluated:Patient Re-evaluated prior to induction Oxygen Delivery Method: Circle system utilized Preoxygenation: Pre-oxygenation with 100% oxygen Induction Type: IV induction Ventilation: Mask ventilation without difficulty LMA: LMA inserted LMA Size: 5.0 Number of attempts: 1 Airway Equipment and Method: Bite block Placement Confirmation: positive ETCO2 Tube secured with: Tape Dental Injury: Teeth and Oropharynx as per pre-operative assessment

## 2020-10-21 NOTE — Transfer of Care (Signed)
Immediate Anesthesia Transfer of Care Note  Patient: Theodoro Kos III  Procedure(s) Performed: CLOSED MANIPULATION LEFT KNEE (Left Knee)  Patient Location: PACU  Anesthesia Type:General  Level of Consciousness: awake, alert  and oriented  Airway & Oxygen Therapy: Patient Spontanous Breathing and Patient connected to face mask oxygen  Post-op Assessment: Report given to RN and Post -op Vital signs reviewed and stable  Post vital signs: Reviewed and stable  Last Vitals:  Vitals Value Taken Time  BP 119/83 10/21/20 0853  Temp 36.3 C 10/21/20 0853  Pulse 67 10/21/20 0857  Resp 11 10/21/20 0856  SpO2 100 % 10/21/20 0857  Vitals shown include unvalidated device data.  Last Pain:  Vitals:   10/21/20 0724  TempSrc: Oral  PainSc: 0-No pain      Patients Stated Pain Goal: 6 (10/21/20 0724)  Complications: No complications documented.

## 2020-10-21 NOTE — Interval H&P Note (Signed)
History and Physical Interval Note:  10/21/2020 8:31 AM  Steven Gibson  has presented today for surgery, with the diagnosis of ANKYLOSIS, LEFT KNEE M24.662.  The various methods of treatment have been discussed with the patient and family. After consideration of risks, benefits and other options for treatment, the patient has consented to  Procedure(s): CLOSED MANIPULATION LEFT KNEE (Left) as a surgical intervention.  The patient's history has been reviewed, patient examined, no change in status, stable for surgery.  I have reviewed the patient's chart and labs.  Questions were answered to the patient's satisfaction.     Eulas Post

## 2020-10-21 NOTE — Anesthesia Preprocedure Evaluation (Addendum)
Anesthesia Evaluation  Patient identified by MRN, date of birth, ID band Patient awake    Reviewed: Allergy & Precautions, NPO status , Patient's Chart, lab work & pertinent test results  Airway Mallampati: II  TM Distance: >3 FB Neck ROM: Full    Dental no notable dental hx.    Pulmonary Current Smoker and Patient abstained from smoking.,    Pulmonary exam normal breath sounds clear to auscultation       Cardiovascular Exercise Tolerance: Good hypertension, Pt. on medications Normal cardiovascular exam Rhythm:Regular Rate:Normal  EKg reviewed   Neuro/Psych PSYCHIATRIC DISORDERS    GI/Hepatic negative GI ROS, Neg liver ROS,   Endo/Other  negative endocrine ROS  Renal/GU negative Renal ROS     Musculoskeletal  (+) Arthritis ,   Abdominal   Peds  Hematology negative hematology ROS (+)   Anesthesia Other Findings   Reproductive/Obstetrics                           Anesthesia Physical Anesthesia Plan  ASA: II  Anesthesia Plan: General and Regional   Post-op Pain Management:  Regional for Post-op pain   Induction: Intravenous  PONV Risk Score and Plan: Ondansetron, Midazolam and Treatment may vary due to age or medical condition  Airway Management Planned: LMA  Additional Equipment: None  Intra-op Plan:   Post-operative Plan: Extubation in OR  Informed Consent: I have reviewed the patients History and Physical, chart, labs and discussed the procedure including the risks, benefits and alternatives for the proposed anesthesia with the patient or authorized representative who has indicated his/her understanding and acceptance.     Dental advisory given  Plan Discussed with: CRNA and Anesthesiologist  Anesthesia Plan Comments: (Adductor canal block to assist with postop pain control. GA/LMA. Tanna Furry, MD Update: after discussion with surgeon Dion Saucier, he did not think the patient  would need a nerve block for pain control, so will hold off for now. Tanna Furry, MD   )       Anesthesia Quick Evaluation

## 2020-10-21 NOTE — Op Note (Signed)
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 male with a diagnosis of ANKYLOSIS, LEFT KNEE [phone] who failed conservative measures and elected for surgical management.  He had a total knee replacement and was unable to reach 90 degrees, he had multiple previous operations on his knee, and was extremely stiff even before his knee replacement, but wish to achieve at least another 15 to 20 degrees of motion.  The risks benefits and alternatives were discussed with the patient preoperatively including but not limited to the risks of infection, bleeding, nerve injury, cardiopulmonary complications, the need for revision surgery, among others, and the patient was willing to proceed.  We also discussed the risks of inability to regain and maintain motion, fracture, among others.  ESTIMATED BLOOD LOSS: None  OPERATIVE IMPLANTS: None  OPERATIVE FINDINGS: see below.  OPERATIVE PROCEDURE: He had approximately 3 degrees to 80 degrees before manipulation, and manipulation did yield a very slight amount of lysis of adhesions although not dramatic.  I maintained significant force over an extended duration, and was able to get some degree of gummy improvement in band, ultimately yielding at least 110 degrees of motion.  I was applying a fairly substantial amount of force, and did not feel that additional force would have been clinically useful, and increased risk for periprosthetic fracture.  The patient was awakened and returned to the PACU in stable and satisfactory condition.

## 2020-10-22 ENCOUNTER — Encounter (HOSPITAL_BASED_OUTPATIENT_CLINIC_OR_DEPARTMENT_OTHER): Payer: Self-pay | Admitting: Orthopedic Surgery

## 2020-10-27 ENCOUNTER — Other Ambulatory Visit: Payer: Self-pay

## 2020-10-27 ENCOUNTER — Encounter: Payer: Self-pay | Admitting: Nurse Practitioner

## 2020-10-27 MED ORDER — CLOBETASOL PROPIONATE 0.05 % EX CREA: 1.0000 "application " | TOPICAL_CREAM | Freq: Two times a day (BID) | CUTANEOUS | 0 refills | Status: DC

## 2020-11-12 ENCOUNTER — Other Ambulatory Visit: Payer: Self-pay | Admitting: Nurse Practitioner

## 2020-11-12 DIAGNOSIS — F988 Other specified behavioral and emotional disorders with onset usually occurring in childhood and adolescence: Secondary | ICD-10-CM

## 2020-11-13 MED ORDER — AMPHETAMINE-DEXTROAMPHETAMINE 15 MG PO TABS: 15.0000 mg | ORAL_TABLET | Freq: Four times a day (QID) | ORAL | 0 refills | Status: DC

## 2020-11-14 ENCOUNTER — Encounter: Payer: Self-pay | Admitting: Nurse Practitioner

## 2020-11-14 ENCOUNTER — Other Ambulatory Visit: Payer: Self-pay

## 2020-11-14 ENCOUNTER — Other Ambulatory Visit: Payer: Self-pay | Admitting: Nurse Practitioner

## 2020-11-14 DIAGNOSIS — I10 Essential (primary) hypertension: Secondary | ICD-10-CM

## 2020-11-14 DIAGNOSIS — F988 Other specified behavioral and emotional disorders with onset usually occurring in childhood and adolescence: Secondary | ICD-10-CM

## 2020-11-14 MED ORDER — ESCITALOPRAM OXALATE 20 MG PO TABS: 20.0000 mg | ORAL_TABLET | Freq: Every day | ORAL | 1 refills | Status: DC

## 2020-12-02 ENCOUNTER — Other Ambulatory Visit: Payer: Self-pay | Admitting: Nurse Practitioner

## 2020-12-03 ENCOUNTER — Other Ambulatory Visit: Payer: Self-pay | Admitting: Nurse Practitioner

## 2020-12-08 ENCOUNTER — Encounter: Payer: Self-pay | Admitting: Nurse Practitioner

## 2020-12-08 ENCOUNTER — Other Ambulatory Visit: Payer: Self-pay | Admitting: Nurse Practitioner

## 2020-12-08 DIAGNOSIS — F988 Other specified behavioral and emotional disorders with onset usually occurring in childhood and adolescence: Secondary | ICD-10-CM

## 2020-12-08 MED ORDER — AMPHETAMINE-DEXTROAMPHETAMINE 15 MG PO TABS: 15.0000 mg | ORAL_TABLET | Freq: Four times a day (QID) | ORAL | 0 refills | Status: DC

## 2020-12-08 MED ORDER — CLOBETASOL PROPIONATE 0.05 % EX CREA: 1.0000 "application " | TOPICAL_CREAM | Freq: Two times a day (BID) | CUTANEOUS | 0 refills | Status: DC

## 2020-12-08 MED ORDER — ESCITALOPRAM OXALATE 20 MG PO TABS: 20.0000 mg | ORAL_TABLET | Freq: Every day | ORAL | 1 refills | Status: DC

## 2020-12-18 ENCOUNTER — Ambulatory Visit (INDEPENDENT_AMBULATORY_CARE_PROVIDER_SITE_OTHER): Payer: Managed Care, Other (non HMO) | Admitting: Podiatry

## 2020-12-18 ENCOUNTER — Other Ambulatory Visit: Payer: Self-pay

## 2020-12-18 ENCOUNTER — Encounter: Payer: Self-pay | Admitting: Podiatry

## 2020-12-18 DIAGNOSIS — M21619 Bunion of unspecified foot: Secondary | ICD-10-CM

## 2020-12-18 DIAGNOSIS — L600 Ingrowing nail: Secondary | ICD-10-CM

## 2020-12-18 DIAGNOSIS — B351 Tinea unguium: Secondary | ICD-10-CM

## 2020-12-18 NOTE — Progress Notes (Signed)
Subjective:   Patient ID: Steven Gibson, male   DOB: 50 y.o.   MRN: [id number]   HPI Patient presents stating that he has had problems with his feet with lesion on the side of his right big toe and bunion site and thickened nails second of both feet that become painful and other nails that become thickened with long-term history of this with no history of acute injury.  Patient does smoke 6 to 10 cigarettes/day and tries to be active   Review of Systems  All other systems reviewed and are negative.      Objective:  Physical Exam Vitals and nursing note reviewed.  Constitutional:      Appearance: He is well-developed.  Pulmonary:     Effort: Pulmonary effort is normal.  Musculoskeletal:        General: Normal range of motion.  Skin:    General: Skin is warm.  Neurological:     Mental Status: He is alert.    Neurovascular status intact muscle strength adequate range of motion within normal limits.  Patient is found to have moderate structural deformity around the first MPJ right over left and is found to have elongated second digits bilateral with chronic nail disease that is thickened and painful when pressed.  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  Will schedule for AP x2-second and do not currently recommend oral antifungal as I think it is more trauma than it is  appear fungal induced

## 2021-01-08 ENCOUNTER — Other Ambulatory Visit: Payer: Self-pay | Admitting: Nurse Practitioner

## 2021-01-08 DIAGNOSIS — F988 Other specified behavioral and emotional disorders with onset usually occurring in childhood and adolescence: Secondary | ICD-10-CM

## 2021-01-08 MED ORDER — AMPHETAMINE-DEXTROAMPHETAMINE 15 MG PO TABS: 15.0000 mg | ORAL_TABLET | Freq: Four times a day (QID) | ORAL | 0 refills | Status: DC

## 2021-01-08 MED ORDER — CLOBETASOL PROPIONATE 0.05 % EX CREA: TOPICAL_CREAM | Freq: Two times a day (BID) | CUTANEOUS | 0 refills | Status: DC

## 2021-02-10 ENCOUNTER — Other Ambulatory Visit: Payer: Self-pay | Admitting: Nurse Practitioner

## 2021-02-10 DIAGNOSIS — F988 Other specified behavioral and emotional disorders with onset usually occurring in childhood and adolescence: Secondary | ICD-10-CM

## 2021-02-12 ENCOUNTER — Other Ambulatory Visit: Payer: Self-pay | Admitting: Nurse Practitioner

## 2021-02-12 DIAGNOSIS — F988 Other specified behavioral and emotional disorders with onset usually occurring in childhood and adolescence: Secondary | ICD-10-CM

## 2021-02-12 MED ORDER — CLOBETASOL PROPIONATE 0.05 % EX CREA: TOPICAL_CREAM | Freq: Two times a day (BID) | CUTANEOUS | 0 refills | Status: DC

## 2021-02-12 MED ORDER — AMPHETAMINE-DEXTROAMPHETAMINE 15 MG PO TABS: 15.0000 mg | ORAL_TABLET | Freq: Four times a day (QID) | ORAL | 0 refills | Status: DC

## 2021-02-16 MED ORDER — AMPHETAMINE-DEXTROAMPHETAMINE 15 MG PO TABS: 15.0000 mg | ORAL_TABLET | Freq: Four times a day (QID) | ORAL | 0 refills | Status: DC

## 2021-03-10 ENCOUNTER — Other Ambulatory Visit: Payer: Self-pay | Admitting: Nurse Practitioner

## 2021-03-10 DIAGNOSIS — F988 Other specified behavioral and emotional disorders with onset usually occurring in childhood and adolescence: Secondary | ICD-10-CM

## 2021-03-16 MED ORDER — CLOBETASOL PROPIONATE 0.05 % EX CREA: TOPICAL_CREAM | Freq: Two times a day (BID) | CUTANEOUS | 0 refills | Status: DC

## 2021-03-16 MED ORDER — MELOXICAM 15 MG PO TABS: 15.0000 mg | ORAL_TABLET | Freq: Every day | ORAL | 1 refills | Status: DC

## 2021-03-16 MED ORDER — AMPHETAMINE-DEXTROAMPHETAMINE 15 MG PO TABS: 15.0000 mg | ORAL_TABLET | Freq: Four times a day (QID) | ORAL | 0 refills | Status: DC

## 2021-04-10 ENCOUNTER — Other Ambulatory Visit: Payer: Self-pay | Admitting: Nurse Practitioner

## 2021-04-10 DIAGNOSIS — F988 Other specified behavioral and emotional disorders with onset usually occurring in childhood and adolescence: Secondary | ICD-10-CM

## 2021-04-13 MED ORDER — CLOBETASOL PROPIONATE 0.05 % EX CREA: TOPICAL_CREAM | Freq: Two times a day (BID) | CUTANEOUS | 0 refills | Status: DC

## 2021-04-13 MED ORDER — AMPHETAMINE-DEXTROAMPHETAMINE 15 MG PO TABS: 15.0000 mg | ORAL_TABLET | Freq: Four times a day (QID) | ORAL | 0 refills | Status: DC

## 2021-05-05 ENCOUNTER — Other Ambulatory Visit: Payer: Self-pay | Admitting: Orthopedic Surgery

## 2021-05-05 ENCOUNTER — Other Ambulatory Visit (HOSPITAL_COMMUNITY): Payer: Self-pay | Admitting: Orthopedic Surgery

## 2021-05-05 DIAGNOSIS — G8929 Other chronic pain: Secondary | ICD-10-CM

## 2021-05-11 ENCOUNTER — Other Ambulatory Visit: Payer: Self-pay | Admitting: Nurse Practitioner

## 2021-05-11 DIAGNOSIS — F988 Other specified behavioral and emotional disorders with onset usually occurring in childhood and adolescence: Secondary | ICD-10-CM

## 2021-05-13 MED ORDER — CLOBETASOL PROPIONATE 0.05 % EX CREA: TOPICAL_CREAM | Freq: Two times a day (BID) | CUTANEOUS | 0 refills | Status: DC

## 2021-05-13 MED ORDER — AMPHETAMINE-DEXTROAMPHETAMINE 15 MG PO TABS: 15.0000 mg | ORAL_TABLET | Freq: Four times a day (QID) | ORAL | 0 refills | Status: DC

## 2021-05-21 ENCOUNTER — Ambulatory Visit (HOSPITAL_COMMUNITY)
Admission: RE | Admit: 2021-05-21 | Discharge: 2021-05-21 | Disposition: A | Discharge status: Home / Self Care | Payer: Managed Care, Other (non HMO) | Source: Ambulatory Visit | Attending: Orthopedic Surgery | Admitting: Orthopedic Surgery

## 2021-05-21 ENCOUNTER — Other Ambulatory Visit: Payer: Self-pay

## 2021-05-21 ENCOUNTER — Encounter (HOSPITAL_COMMUNITY)
Admission: RE | Admit: 2021-05-21 | Discharge: 2021-05-21 | Disposition: A | Discharge status: Home / Self Care | Payer: Managed Care, Other (non HMO) | Source: Ambulatory Visit | Attending: Orthopedic Surgery | Admitting: Orthopedic Surgery

## 2021-05-21 DIAGNOSIS — M25562 Pain in left knee: Secondary | ICD-10-CM | POA: Insufficient documentation

## 2021-05-21 DIAGNOSIS — G8929 Other chronic pain: Secondary | ICD-10-CM | POA: Diagnosis present

## 2021-05-21 MED ORDER — TECHNETIUM TC 99M MEDRONATE IV KIT
20.0000 | PACK | Freq: Once | INTRAVENOUS | Status: AC | PRN
  Administered 2021-05-21: 20.7 via INTRAVENOUS

## 2021-06-04 DIAGNOSIS — M1712 Unilateral primary osteoarthritis, left knee: Secondary | ICD-10-CM | POA: Insufficient documentation

## 2021-06-08 ENCOUNTER — Other Ambulatory Visit: Payer: Self-pay | Admitting: Nurse Practitioner

## 2021-06-08 DIAGNOSIS — F988 Other specified behavioral and emotional disorders with onset usually occurring in childhood and adolescence: Secondary | ICD-10-CM

## 2021-06-15 ENCOUNTER — Other Ambulatory Visit: Payer: Self-pay

## 2021-06-15 DIAGNOSIS — F988 Other specified behavioral and emotional disorders with onset usually occurring in childhood and adolescence: Secondary | ICD-10-CM

## 2021-06-15 DIAGNOSIS — I10 Essential (primary) hypertension: Secondary | ICD-10-CM

## 2021-06-15 MED ORDER — LISINOPRIL-HYDROCHLOROTHIAZIDE 20-25 MG PO TABS: 1.0000 | ORAL_TABLET | Freq: Every day | ORAL | 1 refills | Status: DC

## 2021-06-15 MED ORDER — AMLODIPINE BESYLATE 10 MG PO TABS: 10.0000 mg | ORAL_TABLET | Freq: Every day | ORAL | 1 refills | Status: DC

## 2021-06-15 MED ORDER — ESCITALOPRAM OXALATE 20 MG PO TABS: 20.0000 mg | ORAL_TABLET | Freq: Every day | ORAL | 1 refills | Status: DC

## 2021-06-23 ENCOUNTER — Encounter: Payer: Self-pay | Admitting: Nurse Practitioner

## 2021-07-07 ENCOUNTER — Other Ambulatory Visit: Payer: Self-pay

## 2021-07-07 DIAGNOSIS — E782 Mixed hyperlipidemia: Secondary | ICD-10-CM

## 2021-07-07 MED ORDER — PRAVASTATIN SODIUM 20 MG PO TABS: 20.0000 mg | ORAL_TABLET | Freq: Every day | ORAL | 1 refills | Status: DC

## 2021-07-08 ENCOUNTER — Encounter: Payer: Self-pay | Admitting: Nurse Practitioner

## 2021-07-08 ENCOUNTER — Other Ambulatory Visit: Payer: Self-pay | Admitting: Nurse Practitioner

## 2021-07-08 MED ORDER — AMPHETAMINE-DEXTROAMPHETAMINE 15 MG PO TABS: 15.0000 mg | ORAL_TABLET | Freq: Four times a day (QID) | ORAL | 0 refills | Status: DC

## 2021-07-14 ENCOUNTER — Encounter: Payer: Self-pay | Admitting: Nurse Practitioner

## 2021-08-05 ENCOUNTER — Encounter: Payer: Self-pay | Admitting: Nurse Practitioner

## 2021-08-18 ENCOUNTER — Ambulatory Visit (INDEPENDENT_AMBULATORY_CARE_PROVIDER_SITE_OTHER): Payer: Managed Care, Other (non HMO) | Admitting: Nurse Practitioner

## 2021-08-18 ENCOUNTER — Encounter: Payer: Self-pay | Admitting: Nurse Practitioner

## 2021-08-18 VITALS — BP 130/80 | HR 82 | Temp 98.3°F | Ht 73.0 in | Wt 235.0 lb

## 2021-08-18 DIAGNOSIS — E782 Mixed hyperlipidemia: Secondary | ICD-10-CM

## 2021-08-18 DIAGNOSIS — I10 Essential (primary) hypertension: Secondary | ICD-10-CM | POA: Diagnosis not present

## 2021-08-18 DIAGNOSIS — R7303 Prediabetes: Secondary | ICD-10-CM

## 2021-08-18 DIAGNOSIS — E6609 Other obesity due to excess calories: Secondary | ICD-10-CM

## 2021-08-18 DIAGNOSIS — F39 Unspecified mood [affective] disorder: Secondary | ICD-10-CM | POA: Insufficient documentation

## 2021-08-18 DIAGNOSIS — Z6831 Body mass index (BMI) 31.0-31.9, adult: Secondary | ICD-10-CM

## 2021-08-18 DIAGNOSIS — F988 Other specified behavioral and emotional disorders with onset usually occurring in childhood and adolescence: Secondary | ICD-10-CM | POA: Diagnosis not present

## 2021-08-18 DIAGNOSIS — F1410 Cocaine abuse, uncomplicated: Secondary | ICD-10-CM | POA: Insufficient documentation

## 2021-08-18 MED ORDER — AMPHETAMINE-DEXTROAMPHETAMINE 15 MG PO TABS: 15.0000 mg | ORAL_TABLET | Freq: Four times a day (QID) | ORAL | 0 refills | Status: DC

## 2021-08-18 NOTE — Progress Notes (Signed)
?Industrial/product designer as a Education administrator for [employer], FNP.,have documented all relevant documentation on the behalf of Minette Brine, FNP,as directed by  Minette Brine, FNP while in the presence of Minette Brine, [place]. ? ?This visit occurred during the SARS-CoV-2 public health emergency.  Safety protocols were in place, including screening questions prior to the visit, additional usage of staff PPE, and extensive cleaning of exam room while observing appropriate contact time as indicated for disinfecting solutions. ? ?Subjective:  ?  ? Patient ID: Steven Gibson , male    DOB: [birth year] , 51 y.o.   MRN: [id number] ? ? ?Chief Complaint  ?Patient presents with  ? Hypertension  ? ? ?HPI ? ?Patient presents today for a blood pressure and ADHD f/u.  He had to have knee surgery again on left leg due to adherence. Then in December 2022 - was hit by a wheelchair cart at work. Raliegh Ip and workers comp have taken him out of work to do 30 hours a week, permanently. He also has trace edema to left lower leg. He had been going through some anxiety related to his work injury.  ? ?Wt Readings from Last 3 Encounters: ?08/18/21 : 235 lb (106.6 kg) ?10/21/20 : 226 lb 3.2 oz (102.6 kg) ?08/13/20 : 225 lb 12.8 oz (102.4 kg) ? ?He is not cleared to exercise. He is walking 20,000 steps a day. He can not walk for long periods due to the pain, has to sit down sometimes.  ? ? ?Hypertension ?This is a chronic problem. The current episode started more than 1 year ago. The problem is unchanged. The problem is controlled. Pertinent negatives include no anxiety, chest pain, headaches or palpitations. Risk factors for coronary artery disease include male gender, obesity and sedentary lifestyle. Past treatments include angiotensin blockers, diuretics and calcium channel blockers. There are no compliance problems.  There is no history of angina. There is no history of chronic renal disease.  ?Hyperlipidemia ?This is a chronic problem.  The current episode started more than 1 year ago. The problem is controlled. Recent lipid tests were reviewed and are normal. He has no history of chronic renal disease. Pertinent negatives include no chest pain. There are no compliance problems.  Risk factors for coronary artery disease include male sex and obesity.   ? ?Past Medical History:  ?Diagnosis Date  ? Arthritis   ? Arthrofibrosis of knee joint, left   ? post op TKA   ? History of Graves' disease 2008  ? s/p  RAI  same year , on medication for a year then stablized no meds since  (per pt followed by pcp)  ? Hyperlipidemia   ? Hypertension   ? followed by pcp   (in care everywhere pt had stress echo, 03-26-2016, negative for ishemia and echo normal)  ? Wears glasses   ?  ? ?Family History  ?Problem Relation Age of Onset  ? Hypertension Mother   ? Hypertension Father   ? Hypertension Brother   ? Cancer Paternal Grandfather   ? Hypertension Brother   ? ? ? ?Current Outpatient Medications:  ?  amLODipine (NORVASC) 10 MG tablet, Take 1 tablet (10 mg total) by mouth daily., Disp: 90 tablet, Rfl: 1 ?  clobetasol cream (TEMOVATE) 0.05 %, Apply topically 2 (two) times daily., Disp: 30 g, Rfl: 0 ?  escitalopram (LEXAPRO) 20 MG tablet, Take 1 tablet (20 mg total) by mouth daily., Disp: 90 tablet, Rfl: 1 ?  lisinopril-hydrochlorothiazide (ZESTORETIC) 20-25 MG  tablet, Take 1 tablet by mouth daily., Disp: 90 tablet, Rfl: 1 ?  meloxicam (MOBIC) 15 MG tablet, Take 1 tablet (15 mg total) by mouth daily., Disp: 90 tablet, Rfl: 1 ?  Potassium Chloride (K+ POTASSIUM PO), Take by mouth. Over the counter, per pt takes for leg cramps, Disp: , Rfl:  ?  pravastatin (PRAVACHOL) 20 MG tablet, Take 1 tablet (20 mg total) by mouth daily., Disp: 90 tablet, Rfl: 1 ?  amphetamine-dextroamphetamine (ADDERALL) 15 MG tablet, Take 1 tablet by mouth 3 (three) times daily., Disp: 90 tablet, Rfl: 0  ? ?No Known Allergies  ? ?Review of Systems  ?Constitutional: Negative.  Negative for fatigue.   ?Respiratory: Negative.    ?Cardiovascular: Negative.  Negative for chest pain, palpitations and leg swelling.  ?Gastrointestinal: Negative.   ?Neurological: Negative.  Negative for headaches.  ?Psychiatric/Behavioral:  The patient is nervous/anxious.   ?     History of ADHD   ? ?Today's Vitals  ? 08/18/21 0953  ?BP: 130/80  ?Pulse: 82  ?Temp: 98.3 ?F (36.8 ?C)  ?TempSrc: Oral  ?Weight: 235 lb (106.6 kg)  ?Height: '6\' 1"'  (1.854 m)  ? ?Body mass index is 31 kg/m?.  ?Wt Readings from Last 3 Encounters:  ?08/18/21 235 lb (106.6 kg)  ?10/21/20 226 lb 3.2 oz (102.6 kg)  ?08/13/20 225 lb 12.8 oz (102.4 kg)  ? ? ?Objective:  ?Physical Exam ?Vitals reviewed.  ?Constitutional:   ?   General: He is not in acute distress. ?   Appearance: Normal appearance. He is obese.  ?Cardiovascular:  ?   Rate and Rhythm: Normal rate and regular rhythm.  ?   Pulses: Normal pulses.  ?   Heart sounds: Normal heart sounds. No murmur heard. ?Pulmonary:  ?   Effort: Pulmonary effort is normal. No respiratory distress.  ?   Breath sounds: Normal breath sounds. No wheezing.  ?Musculoskeletal:  ?   Left lower leg: Edema (trace) present.  ?   Comments: Left knee with surgical scar  ?Neurological:  ?   General: No focal deficit present.  ?   Mental Status: He is alert and oriented to person, place, and time.  ?   Cranial Nerves: No cranial nerve deficit.  ?   Motor: No weakness.  ?Psychiatric:     ?   Mood and Affect: Mood normal.     ?   Behavior: Behavior normal.     ?   Thought Content: Thought content normal.     ?   Judgment: Judgment normal.  ?  ? ?   ?Assessment And Plan:  ?   ?1. Essential hypertension ?Comments: Blood pressure is controlled, continue current medications. Encouraged him to make sure he is being seen regularly  ?- CMP14+EGFR ? ?2. Mixed hyperlipidemia ?Comments: Cholesterol levels are improving, tolerating statin well ?- Lipid panel ?- CMP14+EGFR ? ?3. Prediabetes ?Comments: Stable, no current medications.  ?- Hemoglobin  A1c ? ?4. Attention deficit disorder, unspecified hyperactivity presence ?Comments: Rx refilled, stable  ? ?5. Class 1 obesity due to excess calories with serious comorbidity and body mass index (BMI) of 31.0 to 31.9 in adult ? He is encouraged to initially strive for BMI less than 30 to decrease cardiac risk. He is advised to exercise no less than 150 minutes per week.  ? ? ? ?Patient was given opportunity to ask questions. Patient verbalized understanding of the plan and was able to repeat key elements of the plan. All questions were answered to their  satisfaction.  ?Minette Brine, FNP  ? ? ?I, Minette Brine, FNP, have reviewed all documentation for this visit. The documentation on 08/18/21 for the exam, diagnosis, procedures, and orders are all accurate and complete.  ? ?IF YOU HAVE BEEN REFERRED TO A SPECIALIST, IT MAY TAKE 1-2 WEEKS TO SCHEDULE/PROCESS THE REFERRAL. IF YOU HAVE NOT HEARD FROM US/SPECIALIST IN TWO WEEKS, PLEASE GIVE [place] A CALL AT [phone].  ? ?THE PATIENT IS ENCOURAGED TO PRACTICE SOCIAL DISTANCING DUE TO THE COVID-19 PANDEMIC.   ?

## 2021-08-18 NOTE — Patient Instructions (Signed)

## 2021-08-19 LAB — CMP14+EGFR
ALT: 23 IU/L (ref 0–44)
AST: 30 IU/L (ref 0–40)
Albumin/Globulin Ratio: 1.4 (ref 1.2–2.2)
Albumin: 4.2 g/dL (ref 3.8–4.9)
Alkaline Phosphatase: 125 IU/L — ABNORMAL HIGH (ref 44–121)
BUN/Creatinine Ratio: 16 (ref 9–20)
BUN: 15 mg/dL (ref 6–24)
Bilirubin Total: <0.2 mg/dL (ref 0.0–1.2)
CO2: 21 mmol/L (ref 20–29)
Calcium: 9.7 mg/dL (ref 8.7–10.2)
Chloride: 102 mmol/L (ref 96–106)
Creatinine, Ser: 0.92 mg/dL (ref 0.76–1.27)
Globulin, Total: 3 g/dL (ref 1.5–4.5)
Glucose: 107 mg/dL — ABNORMAL HIGH (ref 70–99)
Potassium: 3.9 mmol/L (ref 3.5–5.2)
Sodium: 139 mmol/L (ref 134–144)
Total Protein: 7.2 g/dL (ref 6.0–8.5)
eGFR: 101 mL/min/{1.73_m2} (ref >59)

## 2021-08-19 LAB — LIPID PANEL
Chol/HDL Ratio: 5.8 ratio — ABNORMAL HIGH (ref 0.0–5.0)
Cholesterol, Total: 237 mg/dL — ABNORMAL HIGH (ref 100–199)
HDL: 41 mg/dL (ref >39)
LDL Chol Calc (NIH): 120 mg/dL — ABNORMAL HIGH (ref 0–99)
Triglycerides: 430 mg/dL — ABNORMAL HIGH (ref 0–149)
VLDL Cholesterol Cal: 76 mg/dL — ABNORMAL HIGH (ref 5–40)

## 2021-08-19 LAB — HEMOGLOBIN A1C
Est. average glucose Bld gHb Est-mCnc: 120 mg/dL
Hgb A1c MFr Bld: 5.8 % — ABNORMAL HIGH (ref 4.8–5.6)

## 2021-08-25 ENCOUNTER — Other Ambulatory Visit: Payer: Self-pay | Admitting: Nurse Practitioner

## 2021-08-25 DIAGNOSIS — F988 Other specified behavioral and emotional disorders with onset usually occurring in childhood and adolescence: Secondary | ICD-10-CM

## 2021-08-25 MED ORDER — AMPHETAMINE-DEXTROAMPHETAMINE 15 MG PO TABS: 15.0000 mg | ORAL_TABLET | Freq: Three times a day (TID) | ORAL | 0 refills | Status: DC

## 2021-10-15 ENCOUNTER — Other Ambulatory Visit: Payer: Self-pay | Admitting: Nurse Practitioner

## 2021-10-15 DIAGNOSIS — F988 Other specified behavioral and emotional disorders with onset usually occurring in childhood and adolescence: Secondary | ICD-10-CM

## 2021-10-15 MED ORDER — AMPHETAMINE-DEXTROAMPHETAMINE 15 MG PO TABS: 15.0000 mg | ORAL_TABLET | Freq: Three times a day (TID) | ORAL | 0 refills | Status: DC

## 2021-10-15 NOTE — Progress Notes (Signed)
I have refilled the adderall he was taking prior to methylphenidate until he gets in to see Dr Karleen Hampshire next week.  He is to have his future Rxs for ADHD by Dr. Karleen Hampshire until stable.

## 2021-10-15 NOTE — Progress Notes (Signed)
Received request for refill of Adderal, reviewed PDMP, has had Methylphenidate sent in to the pharmacy by Dr. Frederico Hamman will call patient to make aware already on a stimulant will not be able to send rx for adderal. Spoke to his wife she reports he has seen Dr Frederico Hamman and he has stopped taking due to not effective, will call patient to make him aware if he is making changes will need to follow up with Dr. Frederico Hamman.

## 2021-11-05 ENCOUNTER — Encounter: Payer: Self-pay | Admitting: Nurse Practitioner

## 2021-11-12 ENCOUNTER — Other Ambulatory Visit: Payer: Self-pay | Admitting: Nurse Practitioner

## 2021-11-12 ENCOUNTER — Telehealth: Payer: Self-pay | Admitting: Nurse Practitioner

## 2021-11-12 DIAGNOSIS — F988 Other specified behavioral and emotional disorders with onset usually occurring in childhood and adolescence: Secondary | ICD-10-CM

## 2021-11-12 NOTE — Telephone Encounter (Signed)
Spoke with Steven Gibson at [name] provider office about the medications for his ADHD. She informed me he had been on ritalin but there was some confusion that he was on Adderall as well. She is sending a message to [name] to return a call to me to discuss the plan of care so the patient is not getting both medications and plan of care.

## 2021-11-14 ENCOUNTER — Other Ambulatory Visit: Payer: Self-pay | Admitting: Nurse Practitioner

## 2021-11-14 DIAGNOSIS — F988 Other specified behavioral and emotional disorders with onset usually occurring in childhood and adolescence: Secondary | ICD-10-CM

## 2021-11-14 DIAGNOSIS — I10 Essential (primary) hypertension: Secondary | ICD-10-CM

## 2021-12-06 ENCOUNTER — Other Ambulatory Visit: Payer: Self-pay | Admitting: Nurse Practitioner

## 2021-12-06 DIAGNOSIS — F988 Other specified behavioral and emotional disorders with onset usually occurring in childhood and adolescence: Secondary | ICD-10-CM

## 2021-12-06 MED ORDER — AMPHETAMINE-DEXTROAMPHETAMINE 15 MG PO TABS: 15.0000 mg | ORAL_TABLET | Freq: Three times a day (TID) | ORAL | 0 refills | Status: DC

## 2021-12-17 ENCOUNTER — Ambulatory Visit: Payer: Managed Care, Other (non HMO) | Admitting: Nurse Practitioner

## 2021-12-17 ENCOUNTER — Encounter: Payer: Self-pay | Admitting: Nurse Practitioner

## 2021-12-17 VITALS — BP 138/68 | HR 78 | Temp 98.5°F | Ht 73.0 in | Wt 243.4 lb

## 2021-12-17 DIAGNOSIS — Z79899 Other long term (current) drug therapy: Secondary | ICD-10-CM

## 2021-12-17 DIAGNOSIS — I10 Essential (primary) hypertension: Secondary | ICD-10-CM | POA: Diagnosis not present

## 2021-12-17 DIAGNOSIS — E6609 Other obesity due to excess calories: Secondary | ICD-10-CM

## 2021-12-17 DIAGNOSIS — F988 Other specified behavioral and emotional disorders with onset usually occurring in childhood and adolescence: Secondary | ICD-10-CM | POA: Diagnosis not present

## 2021-12-17 DIAGNOSIS — R7303 Prediabetes: Secondary | ICD-10-CM

## 2021-12-17 DIAGNOSIS — Z125 Encounter for screening for malignant neoplasm of prostate: Secondary | ICD-10-CM

## 2021-12-17 DIAGNOSIS — E782 Mixed hyperlipidemia: Secondary | ICD-10-CM

## 2021-12-17 DIAGNOSIS — Z0000 Encounter for general adult medical examination without abnormal findings: Secondary | ICD-10-CM | POA: Diagnosis not present

## 2021-12-17 DIAGNOSIS — Z6832 Body mass index (BMI) 32.0-32.9, adult: Secondary | ICD-10-CM

## 2021-12-17 LAB — POCT URINALYSIS DIPSTICK
Bilirubin, UA: NEGATIVE
Blood, UA: NEGATIVE
Glucose, UA: NEGATIVE
Ketones, UA: NEGATIVE
Leukocytes, UA: NEGATIVE
Nitrite, UA: NEGATIVE
Protein, UA: NEGATIVE
Spec Grav, UA: 1.025 (ref 1.010–1.025)
Urobilinogen, UA: 0.2 E.U./dL
pH, UA: 7 (ref 5.0–8.0)

## 2021-12-17 NOTE — Progress Notes (Addendum)
[name],acting as a Education administrator for Minette Brine, FNP.,have documented all relevant documentation on the behalf of Minette Brine, FNP,as directed by  Minette Brine, FNP while in the presence of Minette Brine, [name].   Subjective:     Patient ID: Steven Gibson , male    DOB: 24-Apr-1971 , 51 y.o.   MRN: [id number]   Chief Complaint  Patient presents with   Annual Exam    HPI  Patient presents today for HM. Patient is complaint with all medications, Patient wants to stop some of his medications. He has stopped taking adderral and his lexapro, he is trying to cut out on cigarettes. He is trying to eat more healthy. He reports he continues to see [name]. He was on an extended release medication x 2 with [name].   BP Readings from Last 3 Encounters: 12/17/21 : 138/68 08/18/21 : 130/80 10/21/20 : 122/87       Past Medical History:  Diagnosis Date   Arthritis    Arthrofibrosis of knee joint, left    post op TKA    History of Graves' disease 2008   s/p  RAI  same year , on medication for a year then stablized no meds since  (per pt followed by pcp)   Hyperlipidemia    Hypertension    followed by pcp   (in care everywhere pt had stress echo, 03-26-2016, negative for ishemia and echo normal)   Wears glasses      Family History  Problem Relation Age of Onset   Hypertension Mother    Hypertension Father    Hypertension Brother    Cancer Paternal Grandfather    Hypertension Brother      Current Outpatient Medications:    amLODipine (NORVASC) 10 MG tablet, TAKE 1 TABLET BY MOUTH DAILY, Disp: 90 tablet, Rfl: 1   clobetasol cream (TEMOVATE) 0.05 %, Apply topically 2 (two) times daily., Disp: 30 g, Rfl: 0   lisinopril-hydrochlorothiazide (ZESTORETIC) 20-25 MG tablet, TAKE 1 TABLET BY MOUTH DAILY, Disp: 90 tablet, Rfl: 1   meloxicam (MOBIC) 15 MG tablet, Take 1 tablet (15 mg total) by mouth daily., Disp: 90 tablet, Rfl: 1   Potassium Chloride (K+ POTASSIUM PO), Take by mouth.  Over the counter, per pt takes for leg cramps, Disp: , Rfl:    pravastatin (PRAVACHOL) 20 MG tablet, Take 1 tablet (20 mg total) by mouth daily., Disp: 90 tablet, Rfl: 1   No Known Allergies   Men's preventive visit. Patient Health Questionnaire (PHQ-2) is  [name] Office Visit from 12/17/2021 in [name]  PHQ-2 Total Score 0      Patient is on a Regular diet.  Exercising - he is unable to exercise due to his left knee, he someone to run into him with a wheelchair cart and broke the cement away. He has been to the workers comp provider. He has bone spurs to his knee. He has to have a revision to his left knee to apply rods and new joint. This occurred in December 2022.  Marital status: Married. Relevant history for alcohol use is:  Social History   Substance and Sexual Activity  Alcohol Use Yes   Comment: occasional   Relevant history for tobacco use is:  Social History   Tobacco Use  Smoking Status Some Days   Packs/day: 0.50   Years: 25.00   Total pack years: 12.50   Types: Cigarettes  Smokeless Tobacco Former   Quit date: 10/20/1988  .  Review of Systems  Constitutional: Negative.   HENT: Negative.    Eyes: Negative.   Respiratory: Negative.    Cardiovascular: Negative.   Gastrointestinal: Negative.   Endocrine: Negative.   Genitourinary: Negative.   Musculoskeletal: Negative.   Skin: Negative.   Allergic/Immunologic: Negative.   Neurological: Negative.   Hematological: Negative.   Psychiatric/Behavioral: Negative.       Today's Vitals   12/17/21 0857  BP: 138/68  Pulse: 78  Temp: 98.5 F (36.9 C)  TempSrc: Oral  Weight: 243 lb 6.4 oz (110.4 kg)  Height: '6\' 1"'  (1.854 m)  PainSc: 9   PainLoc: Leg   Body mass index is 32.11 kg/m.  Wt Readings from Last 3 Encounters:  12/17/21 243 lb 6.4 oz (110.4 kg)  08/18/21 235 lb (106.6 kg)  10/21/20 226 lb 3.2 oz (102.6 kg)     Objective:  Physical Exam Vitals reviewed.   Constitutional:      General: He is not in acute distress.    Appearance: Normal appearance. He is obese.  HENT:     Head: Normocephalic.     Right Ear: Tympanic membrane, ear canal and external ear normal. There is no impacted cerumen.     Left Ear: Tympanic membrane, ear canal and external ear normal. There is no impacted cerumen.     Nose: Nose normal. No congestion.     Mouth/Throat:     Mouth: Mucous membranes are moist.  Eyes:     Extraocular Movements: Extraocular movements intact.     Conjunctiva/sclera: Conjunctivae normal.     Pupils: Pupils are equal, round, and reactive to light.  Cardiovascular:     Rate and Rhythm: Normal rate and regular rhythm.     Pulses: Normal pulses.     Heart sounds: Normal heart sounds. No murmur heard. Pulmonary:     Effort: Pulmonary effort is normal. No respiratory distress.     Breath sounds: Normal breath sounds. No wheezing.  Abdominal:     General: Abdomen is flat. Bowel sounds are normal. There is no distension.     Palpations: Abdomen is soft.     Tenderness: There is no abdominal tenderness.  Musculoskeletal:        General: No swelling or tenderness. Normal range of motion.     Cervical back: Normal range of motion and neck supple. No rigidity.  Skin:    General: Skin is warm and dry.     Capillary Refill: Capillary refill takes less than 2 seconds.  Neurological:     General: No focal deficit present.     Mental Status: He is alert and oriented to person, place, and time.     Cranial Nerves: No cranial nerve deficit.     Sensory: No sensory deficit.     Motor: No weakness.  Psychiatric:        Mood and Affect: Mood normal.        Behavior: Behavior normal.        Thought Content: Thought content normal.        Judgment: Judgment normal.         Assessment And Plan:    1. Annual physical exam Behavior modifications discussed and diet history reviewed.   Pt will continue to exercise regularly and modify diet with low  GI, plant based foods and decrease intake of processed foods.  Recommend intake of daily multivitamin, Vitamin D, and calcium.  Recommend colonoscopy (up to date) for preventive screenings, as well as recommend immunizations  that include influenza, TDAP, and Shingles  2. Encounter for prostate cancer screening - PSA  3. Class 1 obesity due to excess calories without serious comorbidity with body mass index (BMI) of 32.0 to 32.9 in adult  4. Essential hypertension Comments: Blood pressure is fairly controlled, continue current medications. Continue with low salt diet. EKG done with SR HR 76, ECG itself no changes from previous - EKG 12-Lead - Microalbumin / Creatinine Urine Ratio - POCT Urinalysis Dipstick (81002)  5. Attention deficit disorder, unspecified hyperactivity presence Comments: He is no longer taking the Adderall since he is not working currently. I did not refill his medication.   6. Mixed hyperlipidemia Comments: Cholesterol levels have improved, continue statin, tolerating well. - CMP14+EGFR  7. Prediabetes Comments: Stable, diet controlled. Continue focusing on healthy diet low in sugar and starches.  - Hemoglobin A1c - Lipid panel  8. Other long term (current) drug therapy - CBC no Diff     Patient was given opportunity to ask questions. Patient verbalized understanding of the plan and was able to repeat key elements of the plan. All questions were answered to their satisfaction.   Minette Brine, FNP   I, Minette Brine, FNP, have reviewed all documentation for this visit. The documentation on 12/17/21 for the exam, diagnosis, procedures, and orders are all accurate and complete.   THE PATIENT IS ENCOURAGED TO PRACTICE SOCIAL DISTANCING DUE TO THE COVID-19 PANDEMIC.

## 2021-12-17 NOTE — Patient Instructions (Signed)
Health Maintenance, Male Adopting a healthy lifestyle and getting preventive care are important in promoting health and wellness. Ask your health care provider about: The right schedule for you to have regular tests and exams. Things you can do on your own to prevent diseases and keep yourself healthy. What should I know about diet, weight, and exercise? Eat a healthy diet  Eat a diet that includes plenty of vegetables, fruits, low-fat dairy products, and lean protein. Do not eat a lot of foods that are high in solid fats, added sugars, or sodium. Maintain a healthy weight Body mass index (BMI) is a measurement that can be used to identify possible weight problems. It estimates body fat based on height and weight. Your health care provider can help determine your BMI and help you achieve or maintain a healthy weight. Get regular exercise Get regular exercise. This is one of the most important things you can do for your health. Most adults should: Exercise for at least 150 minutes each week. The exercise should increase your heart rate and make you sweat (moderate-intensity exercise). Do strengthening exercises at least twice a week. This is in addition to the moderate-intensity exercise. Spend less time sitting. Even light physical activity can be beneficial. Watch cholesterol and blood lipids Have your blood tested for lipids and cholesterol at 52 years of age, then have this test every 5 years. You may need to have your cholesterol levels checked more often if: Your lipid or cholesterol levels are high. You are older than 52 years of age. You are at high risk for heart disease. What should I know about cancer screening? Many types of cancers can be detected early and may often be prevented. Depending on your health history and family history, you may need to have cancer screening at various ages. This may include screening for: Colorectal cancer. Prostate cancer. Skin cancer. Lung  cancer. What should I know about heart disease, diabetes, and high blood pressure? Blood pressure and heart disease High blood pressure causes heart disease and increases the risk of stroke. This is more likely to develop in people who have high blood pressure readings or are overweight. Talk with your health care provider about your target blood pressure readings. Have your blood pressure checked: Every 3-5 years if you are 18-39 years of age. Every year if you are 40 years old or older. If you are between the ages of 65 and 75 and are a current or former smoker, ask your health care provider if you should have a one-time screening for abdominal aortic aneurysm (AAA). Diabetes Have regular diabetes screenings. This checks your fasting blood sugar level. Have the screening done: Once every three years after age 45 if you are at a normal weight and have a low risk for diabetes. More often and at a younger age if you are overweight or have a high risk for diabetes. What should I know about preventing infection? Hepatitis B If you have a higher risk for hepatitis B, you should be screened for this virus. Talk with your health care provider to find out if you are at risk for hepatitis B infection. Hepatitis C Blood testing is recommended for: Everyone born from 1945 through 1965. Anyone with known risk factors for hepatitis C. Sexually transmitted infections (STIs) You should be screened each year for STIs, including gonorrhea and chlamydia, if: You are sexually active and are younger than 52 years of age. You are older than 52 years of age and your   health care provider tells you that you are at risk for this type of infection. Your sexual activity has changed since you were last screened, and you are at increased risk for chlamydia or gonorrhea. Ask your health care provider if you are at risk. Ask your health care provider about whether you are at high risk for HIV. Your health care provider  may recommend a prescription medicine to help prevent HIV infection. If you choose to take medicine to prevent HIV, you should first get tested for HIV. You should then be tested every 3 months for as long as you are taking the medicine. Follow these instructions at home: Alcohol use Do not drink alcohol if your health care provider tells you not to drink. If you drink alcohol: Limit how much you have to 0-2 drinks a day. Know how much alcohol is in your drink. In the [place] one drink equals one 12 oz bottle of beer (355 mL), one 5 oz glass of wine (148 mL), or one 1 oz glass of hard liquor (44 mL). Lifestyle Do not use any products that contain nicotine or tobacco. These products include cigarettes, chewing tobacco, and vaping devices, such as e-cigarettes. If you need help quitting, ask your health care provider. Do not use street drugs. Do not share needles. Ask your health care provider for help if you need support or information about quitting drugs. General instructions Schedule regular health, dental, and eye exams. Stay current with your vaccines. Tell your health care provider if: You often feel depressed. You have ever been abused or do not feel safe at home. Summary Adopting a healthy lifestyle and getting preventive care are important in promoting health and wellness. Follow your health care provider's instructions about healthy diet, exercising, and getting tested or screened for diseases. Follow your health care provider's instructions on monitoring your cholesterol and blood pressure. This information is not intended to replace advice given to you by your health care provider. Make sure you discuss any questions you have with your health care provider. Document Revised: 09/22/2020 Document Reviewed: 09/22/2020 Elsevier Patient Education  2023 Elsevier Inc.  

## 2021-12-18 ENCOUNTER — Encounter: Payer: Self-pay | Admitting: Nurse Practitioner

## 2021-12-18 LAB — CMP14+EGFR
ALT: 27 IU/L (ref 0–44)
AST: 28 IU/L (ref 0–40)
Albumin/Globulin Ratio: 1.7 (ref 1.2–2.2)
Albumin: 4.5 g/dL (ref 3.8–4.9)
Alkaline Phosphatase: 123 IU/L — ABNORMAL HIGH (ref 44–121)
BUN/Creatinine Ratio: 17 (ref 9–20)
BUN: 17 mg/dL (ref 6–24)
Bilirubin Total: <0.2 mg/dL (ref 0.0–1.2)
CO2: 23 mmol/L (ref 20–29)
Calcium: 9.9 mg/dL (ref 8.7–10.2)
Chloride: 101 mmol/L (ref 96–106)
Creatinine, Ser: 1 mg/dL (ref 0.76–1.27)
Globulin, Total: 2.7 g/dL (ref 1.5–4.5)
Glucose: 109 mg/dL — ABNORMAL HIGH (ref 70–99)
Potassium: 4.1 mmol/L (ref 3.5–5.2)
Sodium: 140 mmol/L (ref 134–144)
Total Protein: 7.2 g/dL (ref 6.0–8.5)
eGFR: 91 mL/min/{1.73_m2} (ref >59)

## 2021-12-18 LAB — CBC
Hematocrit: 43.4 % (ref 37.5–51.0)
Hemoglobin: 14.6 g/dL (ref 13.0–17.7)
MCH: 29 pg (ref 26.6–33.0)
MCHC: 33.6 g/dL (ref 31.5–35.7)
MCV: 86 fL (ref 79–97)
Platelets: 262 10*3/uL (ref 150–450)
RBC: 5.04 x10E6/uL (ref 4.14–5.80)
RDW: 13.5 % (ref 11.6–15.4)
WBC: 5.4 10*3/uL (ref 3.4–10.8)

## 2021-12-18 LAB — MICROALBUMIN / CREATININE URINE RATIO
Creatinine, Urine: 105.4 mg/dL
Microalb/Creat Ratio: 3 mg/g creat (ref 0–29)
Microalbumin, Urine: 3.6 ug/mL

## 2021-12-18 LAB — LIPID PANEL
Chol/HDL Ratio: 4.9 ratio (ref 0.0–5.0)
Cholesterol, Total: 232 mg/dL — ABNORMAL HIGH (ref 100–199)
HDL: 47 mg/dL (ref >39)
LDL Chol Calc (NIH): 151 mg/dL — ABNORMAL HIGH (ref 0–99)
Triglycerides: 186 mg/dL — ABNORMAL HIGH (ref 0–149)
VLDL Cholesterol Cal: 34 mg/dL (ref 5–40)

## 2021-12-18 LAB — HEMOGLOBIN A1C
Est. average glucose Bld gHb Est-mCnc: 131 mg/dL
Hgb A1c MFr Bld: 6.2 % — ABNORMAL HIGH (ref 4.8–5.6)

## 2021-12-18 LAB — PSA: Prostate Specific Ag, Serum: 0.8 ng/mL (ref 0.0–4.0)

## 2021-12-24 ENCOUNTER — Encounter: Payer: Self-pay | Admitting: Nurse Practitioner

## 2021-12-30 ENCOUNTER — Encounter: Payer: Self-pay | Admitting: Nurse Practitioner

## 2022-01-13 ENCOUNTER — Encounter: Payer: Self-pay | Admitting: Nurse Practitioner

## 2022-01-13 ENCOUNTER — Ambulatory Visit (INDEPENDENT_AMBULATORY_CARE_PROVIDER_SITE_OTHER): Payer: Managed Care, Other (non HMO) | Admitting: Nurse Practitioner

## 2022-01-13 VITALS — BP 140/98 | HR 89 | Temp 98.1°F | Ht 73.0 in | Wt 250.0 lb

## 2022-01-13 DIAGNOSIS — E6609 Other obesity due to excess calories: Secondary | ICD-10-CM | POA: Diagnosis not present

## 2022-01-13 DIAGNOSIS — I10 Essential (primary) hypertension: Secondary | ICD-10-CM | POA: Diagnosis not present

## 2022-01-13 DIAGNOSIS — Z8639 Personal history of other endocrine, nutritional and metabolic disease: Secondary | ICD-10-CM

## 2022-01-13 DIAGNOSIS — Z6832 Body mass index (BMI) 32.0-32.9, adult: Secondary | ICD-10-CM | POA: Diagnosis not present

## 2022-01-13 DIAGNOSIS — R221 Localized swelling, mass and lump, neck: Secondary | ICD-10-CM | POA: Diagnosis not present

## 2022-01-13 NOTE — Progress Notes (Signed)
I,Tianna Badgett,acting as a Neurosurgeon for [employer], FNP.,have documented all relevant documentation on the behalf of [name], FNP,as directed by  [name], FNP while in the presence of [name], FNP.   Subjective:     Patient ID: Steven Gibson , male    DOB: 08-10-1970 , 51 y.o.   MRN: [id number]   Chief Complaint  Patient presents with   Mass    HPI  Patient presents today for mass on back of neck.     Reports having seen an Endocrinologist at [facility] for his graves disease. He is concerned about his left eye bulging and would like to see an ophthalmologist.   He is to have surgery on his left knee at the end of October.   Wt Readings from Last 3 Encounters: 01/13/22 : 250 lb (113.4 kg) 12/17/21 : 243 lb 6.4 oz (110.4 kg) 08/18/21 : 235 lb (106.6 kg)       Past Medical History:  Diagnosis Date   Arthritis    Arthrofibrosis of knee joint, left    post op TKA    History of Graves' disease 2008   s/p  RAI  same year , on medication for a year then stablized no meds since  (per pt followed by pcp)   Hyperlipidemia    Hypertension    followed by pcp   (in care everywhere pt had stress echo, 03-26-2016, negative for ishemia and echo normal)   Wears glasses      Family History  Problem Relation Age of Onset   Hypertension Mother    Hypertension Father    Hypertension Brother    Cancer Paternal Grandfather    Hypertension Brother      Current Outpatient Medications:    amLODipine (NORVASC) 10 MG tablet, TAKE 1 TABLET BY MOUTH DAILY, Disp: 90 tablet, Rfl: 1   clobetasol cream (TEMOVATE) 0.05 %, Apply topically 2 (two) times daily., Disp: 30 g, Rfl: 0   lisinopril-hydrochlorothiazide (ZESTORETIC) 20-25 MG tablet, TAKE 1 TABLET BY MOUTH DAILY, Disp: 90 tablet, Rfl: 1   meloxicam (MOBIC) 15 MG tablet, Take 1 tablet (15 mg total) by mouth daily., Disp: 90 tablet, Rfl: 1   Potassium Chloride (K+ POTASSIUM PO), Take by mouth. Over the counter, per pt  takes for leg cramps, Disp: , Rfl:    pravastatin (PRAVACHOL) 20 MG tablet, Take 1 tablet (20 mg total) by mouth daily., Disp: 90 tablet, Rfl: 1   No Known Allergies   Review of Systems  Constitutional: Negative.   Respiratory: Negative.    Cardiovascular: Negative.   Gastrointestinal: Negative.   Skin:        Mass on neck  Neurological: Negative.   Psychiatric/Behavioral: Negative.       Today's Vitals   01/13/22 0917 01/13/22 0941  BP: (!) 180/100 (!) 140/98  Pulse: 89   Temp: 98.1 F (36.7 C)   TempSrc: Oral   Weight: 250 lb (113.4 kg)   Height: 6\' 1"  (1.854 m)    Body mass index is 32.98 kg/m.  Wt Readings from Last 3 Encounters:  01/13/22 250 lb (113.4 kg)  12/17/21 243 lb 6.4 oz (110.4 kg)  08/18/21 235 lb (106.6 kg)    Objective:  Physical Exam Vitals reviewed.  Constitutional:      General: He is not in acute distress.    Appearance: Normal appearance.  Neck:     Comments: Semi firm mass to posterior neck at hairline.  Cardiovascular:  Rate and Rhythm: Normal rate and regular rhythm.     Pulses: Normal pulses.     Heart sounds: Normal heart sounds. No murmur heard. Pulmonary:     Effort: Pulmonary effort is normal. No respiratory distress.     Breath sounds: Normal breath sounds.  Skin:    General: Skin is warm and dry.     Capillary Refill: Capillary refill takes less than 2 seconds.  Neurological:     General: No focal deficit present.     Mental Status: He is alert and oriented to person, place, and time.     Cranial Nerves: No cranial nerve deficit.     Motor: No weakness.  Psychiatric:        Mood and Affect: Mood normal.        Behavior: Behavior normal.        Thought Content: Thought content normal.         Assessment And Plan:     1. Essential hypertension Comments: Blood pressure improved with recheck, encouraged to increase his activity since he is having some weight gain, noticed a history of cocaine abuse in the previous  problems he reports having done this 30 years ago and denies any recent use   2. Mass in neck Comments: Semi firm mass to posterior neck at base of hairline, will check Ultrasound and reports a history of lipomas. - US Soft Tissue Head/Neck (NON-THYROID); Future  3. H/O Graves' disease Comments: Will recheck levels has not seen an Endocrinologist since 2017, will check levels and likely refer to Endocrinology. Has left eye bulging, referral to opth - Ambulatory referral to Ophthalmology - Thyroid Panel With TSH  4. Class 1 obesity due to excess calories with serious comorbidity and body mass index (BMI) of 32.0 to 32.9 in adult He is encouraged to strive for BMI less than 30 to decrease cardiac risk. Advised to aim for at least 150 minutes of exercise per week. Encouraged to go to pool for exercise   Patient was given opportunity to ask questions. Patient verbalized understanding of the plan and was able to repeat key elements of the plan. All questions were answered to their satisfaction.  [name], FNP   I, [name], FNP, have reviewed all documentation for this visit. The documentation on 01/13/22 for the exam, diagnosis, procedures, and orders are all accurate and complete.   IF YOU HAVE BEEN REFERRED TO A SPECIALIST, IT MAY TAKE 1-2 WEEKS TO SCHEDULE/PROCESS THE REFERRAL. IF YOU HAVE NOT HEARD FROM US/SPECIALIST IN TWO WEEKS, PLEASE GIVE [place] A CALL AT [phone].   THE PATIENT IS ENCOURAGED TO PRACTICE SOCIAL DISTANCING DUE TO THE COVID-19 PANDEMIC.

## 2022-01-14 LAB — THYROID PANEL WITH TSH
Free Thyroxine Index: 2.5 (ref 1.2–4.9)
T3 Uptake Ratio: 27 % (ref 24–39)
T4, Total: 9.1 ug/dL (ref 4.5–12.0)
TSH: 1.71 u[IU]/mL (ref 0.450–4.500)

## 2022-01-25 ENCOUNTER — Other Ambulatory Visit (HOSPITAL_COMMUNITY): Payer: Self-pay | Admitting: Orthopedic Surgery

## 2022-01-25 ENCOUNTER — Other Ambulatory Visit: Payer: Self-pay | Admitting: Orthopedic Surgery

## 2022-01-25 DIAGNOSIS — M25562 Pain in left knee: Secondary | ICD-10-CM

## 2022-02-01 ENCOUNTER — Encounter (HOSPITAL_COMMUNITY)
Admission: RE | Admit: 2022-02-01 | Discharge: 2022-02-01 | Disposition: A | Discharge status: Home / Self Care | Payer: No Typology Code available for payment source | Source: Ambulatory Visit | Attending: Orthopedic Surgery | Admitting: Orthopedic Surgery

## 2022-02-01 DIAGNOSIS — M25562 Pain in left knee: Secondary | ICD-10-CM | POA: Diagnosis present

## 2022-02-01 MED ORDER — TECHNETIUM TC 99M MEDRONATE IV KIT
20.0000 | PACK | Freq: Once | INTRAVENOUS | Status: AC | PRN
  Administered 2022-02-01: 20.2 via INTRAVENOUS

## 2022-02-10 DIAGNOSIS — T849XXA Unspecified complication of internal orthopedic prosthetic device, implant and graft, initial encounter: Secondary | ICD-10-CM | POA: Insufficient documentation

## 2022-02-22 ENCOUNTER — Encounter: Payer: Self-pay | Admitting: Nurse Practitioner

## 2022-02-23 ENCOUNTER — Other Ambulatory Visit: Payer: Self-pay | Admitting: Nurse Practitioner

## 2022-02-23 MED ORDER — AMPHETAMINE-DEXTROAMPHETAMINE 15 MG PO TABS: 15.0000 mg | ORAL_TABLET | Freq: Three times a day (TID) | ORAL | 0 refills | Status: DC

## 2022-02-26 ENCOUNTER — Other Ambulatory Visit: Payer: Self-pay | Admitting: Nurse Practitioner

## 2022-02-26 ENCOUNTER — Ambulatory Visit: Payer: 59 | Admitting: Behavioral Health

## 2022-02-26 DIAGNOSIS — I10 Essential (primary) hypertension: Secondary | ICD-10-CM

## 2022-02-26 MED ORDER — AMPHETAMINE-DEXTROAMPHETAMINE 15 MG PO TABS: 15.0000 mg | ORAL_TABLET | Freq: Three times a day (TID) | ORAL | 0 refills | Status: DC

## 2022-02-26 NOTE — Progress Notes (Signed)
Patient to not be charge and appt rescheduled.

## 2022-03-05 ENCOUNTER — Other Ambulatory Visit: Payer: Self-pay | Admitting: Nurse Practitioner

## 2022-03-05 DIAGNOSIS — E782 Mixed hyperlipidemia: Secondary | ICD-10-CM

## 2022-03-08 ENCOUNTER — Ambulatory Visit: Payer: 59 | Admitting: Behavioral Health

## 2022-03-25 ENCOUNTER — Other Ambulatory Visit: Payer: Self-pay | Admitting: Nurse Practitioner

## 2022-03-25 MED ORDER — AMPHETAMINE-DEXTROAMPHETAMINE 15 MG PO TABS: 15.0000 mg | ORAL_TABLET | Freq: Three times a day (TID) | ORAL | 0 refills | Status: DC

## 2022-04-01 ENCOUNTER — Ambulatory Visit (INDEPENDENT_AMBULATORY_CARE_PROVIDER_SITE_OTHER): Payer: 59 | Admitting: Behavioral Health

## 2022-04-01 ENCOUNTER — Encounter: Payer: Self-pay | Admitting: Behavioral Health

## 2022-04-01 VITALS — BP 136/99 | HR 98 | Ht 74.0 in | Wt 245.0 lb

## 2022-04-01 DIAGNOSIS — F411 Generalized anxiety disorder: Secondary | ICD-10-CM

## 2022-04-01 DIAGNOSIS — F900 Attention-deficit hyperactivity disorder, predominantly inattentive type: Secondary | ICD-10-CM

## 2022-04-01 DIAGNOSIS — F331 Major depressive disorder, recurrent, moderate: Secondary | ICD-10-CM

## 2022-04-01 NOTE — Progress Notes (Signed)
[facility] MD/PA/NP Initial Note  04/01/2022 5:27 PM Steven Gibson  MRN:  [id number]  Chief Complaint:  Chief Complaint   Anxiety; Depression; ADHD; Establish Care; Patient Education; Medication Refill     HPI:  "Steven Gibson", 51 year old African-American male presents to this office for initial visit and to establish care.  He says that he was diagnosed with ADHD in his early 31s and has been on medication ever since.  Says that he has also struggled with anxiety and depression throughout most of his life.  He has been receiving at [facility] until his provider recently retired.  He regularly follows up with his PCP.  Says that he is here today to reestablish psychiatric care with a new provider.  Says that his medications are working fine for now and that he would only need refills.  He is currently not working due to an injury to his left knee requiring surgery next month.  He says that he has also been diagnosed with Graves' disease.  He says he is currently stable and not requesting any changes to his medication regimen.  His MDQ was grossly negative.  PHQ-9 was negative.  He reports anxiety at 2/10, and depression at 2/10.  He says that being at home not being able to stay active working bothers him, but he understands that this will improve once he gets his knee surgery.  He is sleeping 7 to 8 hours per night.  He denies any history of mania or psychosis.  No history of auditory or visual hallucinations.  No SI or HI.  Past psychiatric medication trials: Wellbutrin Vyvanse       Visit Diagnosis: No diagnosis found.  Past Psychiatric History: Anxiety, Depression, ADHD      Past Medical History:  Past Medical History:  Diagnosis Date   Arthritis    Arthrofibrosis of knee joint, left    post op TKA    History of Graves' disease 2008   s/p  RAI  same year , on medication for a year then stablized no meds since  (per pt followed by pcp)   Hyperlipidemia     Hypertension    followed by pcp   (in care everywhere pt had stress echo, 03-26-2016, negative for ishemia and echo normal)   Wears glasses     Past Surgical History:  Procedure Laterality Date   ANTERIOR CRUCIATE LIGAMENT REPAIR Left 1990S   KNEE ARTHROSCOPY W/ MEDIAL COLLATERAL LIGAMENT (MCL) REPAIR Left 1990s   KNEE ARTHROSCOPY W/ MENISCAL REPAIR Left 1990s   KNEE CLOSED REDUCTION Left 10/21/2020   Procedure: CLOSED MANIPULATION LEFT KNEE;  Surgeon: Teryl Lucy, MD;  Location: [facility];  Service: Orthopedics;  Laterality: Left;   LIPOMA EXCISION     LEF SHOULDER AREA   TOTAL KNEE ARTHROPLASTY Left 05/06/2020   Procedure: TOTAL KNEE ARTHROPLASTY;  Surgeon: Teryl Lucy, MD;  Location: [facility];  Service: Orthopedics;  Laterality: Left;   UMBILICAL HERNIA REPAIR  07-17-2015  [email or web address]    AND RIGHT ABDOMINAL WALL EXCISION LIPOMA    Family Psychiatric History: none noted this visit  Family History:  Family History  Problem Relation Age of Onset   Hypertension Mother    Hypertension Father    Hypertension Brother    Cancer Paternal Grandfather    Hypertension Brother     Social History:  Social History   Socioeconomic History   Marital status: Married    Spouse name: Not on file   Number of children: Not  on file   Years of education: Not on file   Highest education level: Not on file  Occupational History   Not on file  Tobacco Use   Smoking status: Some Days    Packs/day: 0.50    Years: 25.00    Total pack years: 12.50    Types: Cigarettes   Smokeless tobacco: Former    Quit date: 10/20/1988  Vaping Use   Vaping Use: Never used  Substance and Sexual Activity   Alcohol use: Yes    Comment: occasional   Drug use: Never   Sexual activity: Not Currently  Other Topics Concern   Not on file  Social History Narrative   ** Merged History Encounter **       Social Determinants of Health   Financial Resource Strain: Not on file  Food  Insecurity: Not on file  Transportation Needs: Not on file  Physical Activity: Not on file  Stress: Not on file  Social Connections: Not on file    Allergies: No Known Allergies  Metabolic Disorder Labs: Lab Results  Component Value Date   HGBA1C 6.2 (H) 12/17/2021   No results found for: "PROLACTIN" Lab Results  Component Value Date   CHOL 232 (H) 12/17/2021   TRIG 186 (H) 12/17/2021   HDL 47 12/17/2021   CHOLHDL 4.9 12/17/2021   LDLCALC 151 (H) 12/17/2021   LDLCALC 120 (H) 08/18/2021   Lab Results  Component Value Date   TSH 1.710 01/13/2022   TSH 1.390 04/21/2020    Therapeutic Level Labs: No results found for: "LITHIUM" No results found for: "VALPROATE" No results found for: "CBMZ"  Current Medications: Current Outpatient Medications  Medication Sig Dispense Refill   amLODipine (NORVASC) 10 MG tablet TAKE 1 TABLET BY MOUTH DAILY 90 tablet 1   amphetamine-dextroamphetamine (ADDERALL) 15 MG tablet Take 1 tablet by mouth 3 (three) times daily. 90 tablet 0   clobetasol cream (TEMOVATE) 0.05 % Apply topically 2 (two) times daily. 30 g 0   escitalopram (LEXAPRO) 20 MG tablet Take 20 mg by mouth daily.     lisinopril-hydrochlorothiazide (ZESTORETIC) 20-25 MG tablet TAKE 1 TABLET BY MOUTH DAILY 90 tablet 1   meloxicam (MOBIC) 15 MG tablet Take 1 tablet (15 mg total) by mouth daily. 90 tablet 1   Potassium Chloride (K+ POTASSIUM PO) Take by mouth. Over the counter, per pt takes for leg cramps     pravastatin (PRAVACHOL) 20 MG tablet TAKE 1 TABLET BY MOUTH DAILY 90 tablet 1   No current facility-administered medications for this visit.    Medication Side Effects: none  Orders placed this visit:  No orders of the defined types were placed in this encounter.   Psychiatric Specialty Exam:  Review of Systems  Constitutional: Negative.   Allergic/Immunologic: Negative.   Neurological: Negative.   Psychiatric/Behavioral: Negative.      Blood pressure (!) 136/99,  pulse 98, height 6\' 2"  (1.88 m), weight 245 lb (111.1 kg).Body mass index is 31.46 kg/m.  General Appearance: Casual, Neat, and Well Groomed  Eye Contact:  Good  Speech:  Clear and Coherent  Volume:  Normal  Mood:  Anxious, Depressed, and Dysphoric  Affect:  Appropriate  Thought Process:  Coherent  Orientation:  Full (Time, Place, and Person)  Thought Content: Logical   Suicidal Thoughts:  No  Homicidal Thoughts:  No  Memory:  WNL  Judgement:  Good  Insight:  Good  Psychomotor Activity:  Normal  Concentration:  Concentration: Good  Recall:  Good  Fund of Knowledge: Good  Language: Good  Assets:  Desire for Improvement  ADL's:  Intact  Cognition: WNL  Prognosis:  Good   Screenings:  PHQ2-9    Flowsheet Row Office Visit from 12/17/2021 in [name] Office Visit from 08/18/2021 in [name] Office Visit from 08/13/2020 in [name] Office Visit from 08/08/2019 in [name] Office Visit from 04/17/2019 in [name]  PHQ-2 Total Score 0 0 0 0 0      Flowsheet Row Admission (Discharged) from 10/21/2020 in [facility]  C-SSRS RISK CATEGORY No Risk       Receiving Psychotherapy: No   Treatment Plan/Recommendations:  Greater than 50% of face to face time with patient was spent on counseling and coordination of care. We discussed his long hx of ADHD, anxiety, and depression. We discussed his previous plan of care and medications. Discussed his goals for treatment here and reviewed his current medications. No changes or adjustments to medication indicated at this time.  We agreed to: Will continue Adderall 15 mg three time daily as needed To continue Lexapro 20 mg daily To report side effects or worsening symtoms To follow up in 8 weeks to reassess Provided emergency contact information Reviewed PDMP       Joan Flores, NP

## 2022-04-21 ENCOUNTER — Ambulatory Visit: Payer: Managed Care, Other (non HMO) | Admitting: Nurse Practitioner

## 2022-04-22 ENCOUNTER — Telehealth: Payer: Self-pay | Admitting: Behavioral Health

## 2022-04-22 NOTE — Telephone Encounter (Signed)
Filled 11/10

## 2022-04-22 NOTE — Telephone Encounter (Signed)
Pt called and needs a refill on his adderall 15 mg. The pharmacy is [facility] on [address]

## 2022-04-23 ENCOUNTER — Other Ambulatory Visit: Payer: Self-pay

## 2022-04-23 MED ORDER — AMPHETAMINE-DEXTROAMPHETAMINE 15 MG PO TABS: 15.0000 mg | ORAL_TABLET | Freq: Three times a day (TID) | ORAL | 0 refills | Status: DC

## 2022-04-23 NOTE — Telephone Encounter (Signed)
Pended.

## 2022-05-03 ENCOUNTER — Encounter: Payer: Self-pay | Admitting: Nurse Practitioner

## 2022-05-03 ENCOUNTER — Ambulatory Visit (INDEPENDENT_AMBULATORY_CARE_PROVIDER_SITE_OTHER): Payer: Managed Care, Other (non HMO) | Admitting: Nurse Practitioner

## 2022-05-03 VITALS — BP 130/82 | HR 95 | Temp 98.7°F | Ht 74.0 in | Wt 248.0 lb

## 2022-05-03 DIAGNOSIS — F988 Other specified behavioral and emotional disorders with onset usually occurring in childhood and adolescence: Secondary | ICD-10-CM | POA: Diagnosis not present

## 2022-05-03 DIAGNOSIS — I10 Essential (primary) hypertension: Secondary | ICD-10-CM

## 2022-05-03 DIAGNOSIS — Z2821 Immunization not carried out because of patient refusal: Secondary | ICD-10-CM

## 2022-05-03 DIAGNOSIS — E782 Mixed hyperlipidemia: Secondary | ICD-10-CM

## 2022-05-03 DIAGNOSIS — Z6831 Body mass index (BMI) 31.0-31.9, adult: Secondary | ICD-10-CM

## 2022-05-03 DIAGNOSIS — Z8639 Personal history of other endocrine, nutritional and metabolic disease: Secondary | ICD-10-CM

## 2022-05-03 DIAGNOSIS — E6609 Other obesity due to excess calories: Secondary | ICD-10-CM

## 2022-05-03 DIAGNOSIS — R7303 Prediabetes: Secondary | ICD-10-CM

## 2022-05-03 MED ORDER — AMLODIPINE BESYLATE 10 MG PO TABS: 10.0000 mg | ORAL_TABLET | Freq: Every day | ORAL | 1 refills | Status: DC

## 2022-05-03 MED ORDER — LISINOPRIL-HYDROCHLOROTHIAZIDE 20-25 MG PO TABS: 1.0000 | ORAL_TABLET | Freq: Every day | ORAL | 1 refills | Status: DC

## 2022-05-03 MED ORDER — PRAVASTATIN SODIUM 20 MG PO TABS: 20.0000 mg | ORAL_TABLET | Freq: Every day | ORAL | 1 refills | Status: DC

## 2022-05-03 NOTE — Progress Notes (Signed)
I,Tianna Badgett,acting as a Education administrator for [employer], FNP.,have documented all relevant documentation on the behalf of Minette Brine, FNP,as directed by  Minette Brine, FNP while in the presence of Minette Brine, [place].  Subjective:     Patient ID: Steven Gibson , male    DOB: November 09, 1970 , 51 y.o.   MRN: [id number]   Chief Complaint  Patient presents with   Hypertension    HPI  Patient presents today for a blood pressure and thyroid. He is now seeing Lesle Chris NP for his behavioral health. He is to have left knee surgery for the cement "broke away".    Wt Readings from Last 3 Encounters: 05/03/22 : 248 lb (112.5 kg) 01/13/22 : 250 lb (113.4 kg) 12/17/21 : 243 lb 6.4 oz (110.4 kg)    Hypertension This is a chronic problem. The current episode started more than 1 year ago. The problem is unchanged. The problem is controlled. Pertinent negatives include no anxiety, chest pain, headaches or palpitations. Risk factors for coronary artery disease include male gender, obesity and sedentary lifestyle. Past treatments include angiotensin blockers, diuretics and calcium channel blockers. There are no compliance problems.  There is no history of angina. There is no history of chronic renal disease.  Hyperlipidemia This is a chronic problem. The current episode started more than 1 year ago. The problem is controlled. Recent lipid tests were reviewed and are normal. He has no history of chronic renal disease. Pertinent negatives include no chest pain. There are no compliance problems.  Risk factors for coronary artery disease include male sex and obesity.     Past Medical History:  Diagnosis Date   Arthritis    Arthrofibrosis of knee joint, left    post op TKA    History of Graves' disease 2008   s/p  RAI  same year , on medication for a year then stablized no meds since  (per pt followed by pcp)   Hyperlipidemia    Hypertension    followed by pcp   (in care everywhere pt had stress  echo, 03-26-2016, negative for ishemia and echo normal)   Wears glasses      Family History  Problem Relation Age of Onset   Hypertension Mother    Hypertension Father    Hypertension Brother    Cancer Paternal Grandfather    Hypertension Brother      Current Outpatient Medications:    amLODipine (NORVASC) 10 MG tablet, Take 1 tablet (10 mg total) by mouth daily., Disp: 90 tablet, Rfl: 1   amphetamine-dextroamphetamine (ADDERALL) 15 MG tablet, Take 1 tablet by mouth 3 (three) times daily., Disp: 90 tablet, Rfl: 0   clobetasol cream (TEMOVATE) 0.05 %, Apply topically 2 (two) times daily., Disp: 30 g, Rfl: 0   escitalopram (LEXAPRO) 20 MG tablet, Take 20 mg by mouth daily., Disp: , Rfl:    lisinopril-hydrochlorothiazide (ZESTORETIC) 20-25 MG tablet, Take 1 tablet by mouth daily., Disp: 90 tablet, Rfl: 1   meloxicam (MOBIC) 15 MG tablet, Take 1 tablet (15 mg total) by mouth daily., Disp: 90 tablet, Rfl: 1   Potassium Chloride (K+ POTASSIUM PO), Take by mouth. Over the counter, per pt takes for leg cramps, Disp: , Rfl:    pravastatin (PRAVACHOL) 20 MG tablet, Take 1 tablet (20 mg total) by mouth daily., Disp: 90 tablet, Rfl: 1   No Known Allergies   Review of Systems  Constitutional: Negative.   Respiratory: Negative.    Cardiovascular: Negative.  Negative  for chest pain and palpitations.  Gastrointestinal: Negative.   Neurological: Negative.  Negative for headaches.     Today's Vitals   05/03/22 1036  BP: 130/82  Pulse: 95  Temp: 98.7 F (37.1 C)  TempSrc: Oral  Weight: 248 lb (112.5 kg)  Height: _0  (1.88 m)   Body mass index is 31.84 kg/m.  Wt Readings from Last 3 Encounters:  05/03/22 248 lb (112.5 kg)  01/13/22 250 lb (113.4 kg)  12/17/21 243 lb 6.4 oz (110.4 kg)    Objective:  Physical Exam Vitals reviewed.  Constitutional:      General: He is not in acute distress.    Appearance: Normal appearance.  Neck:     Comments: Semi firm mass to posterior neck  at hairline has not seen surgeon at this time Cardiovascular:     Rate and Rhythm: Normal rate and regular rhythm.     Pulses: Normal pulses.     Heart sounds: Normal heart sounds. No murmur heard. Pulmonary:     Effort: Pulmonary effort is normal. No respiratory distress.     Breath sounds: Normal breath sounds.  Skin:    General: Skin is warm and dry.     Capillary Refill: Capillary refill takes less than 2 seconds.  Neurological:     General: No focal deficit present.     Mental Status: He is alert and oriented to person, place, and time.     Cranial Nerves: No cranial nerve deficit.     Motor: No weakness.  Psychiatric:        Mood and Affect: Mood normal.        Behavior: Behavior normal.        Thought Content: Thought content normal.         Assessment And Plan:     1. Essential hypertension Comments: Blood pressure is fairly controlled, continue current medications. - amLODipine (NORVASC) 10 MG tablet; Take 1 tablet (10 mg total) by mouth daily.  Dispense: 90 tablet; Refill: 1 - lisinopril-hydrochlorothiazide (ZESTORETIC) 20-25 MG tablet; Take 1 tablet by mouth daily.  Dispense: 90 tablet; Refill: 1  2. Mixed hyperlipidemia Comments: Cholesterol levels are stable, will continue pravastatin tolerating well.  Encouraged to eat a low-fat diet - Lipid panel - BMP8+EGFR - pravastatin (PRAVACHOL) 20 MG tablet; Take 1 tablet (20 mg total) by mouth daily.  Dispense: 90 tablet; Refill: 1  3. Prediabetes Comments: Diet controlled, and urged to eat and diet low in sugar and carbs especially since not able to exercise regularly - Hemoglobin A1c  4. H/O Graves' disease Comments: Will recheck thyroid levels.  Information given for Dr. Katy Fitch for his eye exam - Thyroid Panel With TSH  5. Attention deficit disorder, unspecified hyperactivity presence Comments: Continue follow-up with psychiatrist  6. Class 1 obesity due to excess calories with serious comorbidity and body mass  index (BMI) of 31.0 to 31.9 in adult She is encouraged to strive for BMI less than 30 to decrease cardiac risk. Advised to aim for at least 150 minutes of exercise per week.  7. Influenza vaccination declined Patient declined influenza vaccination at this time. Patient is aware that influenza vaccine prevents illness in 70% of healthy people, and reduces hospitalizations to 30-70% in elderly. This vaccine is recommended annually. Education has been provided regarding the importance of this vaccine but patient still declined. Advised may receive this vaccine at local pharmacy or Health Dept.or vaccine clinic. Aware to provide a copy of the vaccination record if  obtained from local pharmacy or Health Dept.  Pt is willing to accept risk associated with refusing vaccination.  8. Tetanus, diphtheria, and acellular pertussis (Tdap) vaccination declined Comments: Explained will likely need to have prior to his knee surgery. He is encouraged to initially strive for BMI less than 30 to decrease cardiac risk. He is advised to exercise no less than 150 minutes per week.     Patient was given opportunity to ask questions. Patient verbalized understanding of the plan and was able to repeat key elements of the plan. All questions were answered to their satisfaction.  Minette Brine, FNP   I, Minette Brine, FNP, have reviewed all documentation for this visit. The documentation on 05/03/22 for the exam, diagnosis, procedures, and orders are all accurate and complete.   IF YOU HAVE BEEN REFERRED TO A SPECIALIST, IT MAY TAKE 1-2 WEEKS TO SCHEDULE/PROCESS THE REFERRAL. IF YOU HAVE NOT HEARD FROM US/SPECIALIST IN TWO WEEKS, PLEASE GIVE [place] A CALL AT [phone].   THE PATIENT IS ENCOURAGED TO PRACTICE SOCIAL DISTANCING DUE TO THE COVID-19 PANDEMIC.

## 2022-05-03 NOTE — Patient Instructions (Addendum)
Hypertension, Adult High blood pressure (hypertension) is when the force of blood pumping through the arteries is too strong. The arteries are the blood vessels that carry blood from the heart throughout the body. Hypertension forces the heart to work harder to pump blood and may cause arteries to become narrow or stiff. Untreated or uncontrolled hypertension can lead to a heart attack, heart failure, a stroke, kidney disease, and other problems. A blood pressure reading consists of a higher number over a lower number. Ideally, your blood pressure should be below 120/80. The first ("top") number is called the systolic pressure. It is a measure of the pressure in your arteries as your heart beats. The second ("bottom") number is called the diastolic pressure. It is a measure of the pressure in your arteries as the heart relaxes. What are the causes? The exact cause of this condition is not known. There are some conditions that result in high blood pressure. What increases the risk? Certain factors may make you more likely to develop high blood pressure. Some of these risk factors are under your control, including: Smoking. Not getting enough exercise or physical activity. Being overweight. Having too much fat, sugar, calories, or salt (sodium) in your diet. Drinking too much alcohol. Other risk factors include: Having a personal history of heart disease, diabetes, high cholesterol, or kidney disease. Stress. Having a family history of high blood pressure and high cholesterol. Having obstructive sleep apnea. Age. The risk increases with age. What are the signs or symptoms? High blood pressure may not cause symptoms. Very high blood pressure (hypertensive crisis) may cause: Headache. Fast or irregular heartbeats (palpitations). Shortness of breath. Nosebleed. Nausea and vomiting. Vision changes. Severe chest pain, dizziness, and seizures. How is this diagnosed? This condition is diagnosed by  measuring your blood pressure while you are seated, with your arm resting on a flat surface, your legs uncrossed, and your feet flat on the floor. The cuff of the blood pressure monitor will be placed directly against the skin of your upper arm at the level of your heart. Blood pressure should be measured at least twice using the same arm. Certain conditions can cause a difference in blood pressure between your right and left arms. If you have a high blood pressure reading during one visit or you have normal blood pressure with other risk factors, you may be asked to: Return on a different day to have your blood pressure checked again. Monitor your blood pressure at home for 1 week or longer. If you are diagnosed with hypertension, you may have other blood or imaging tests to help your health care provider understand your overall risk for other conditions. How is this treated? This condition is treated by making healthy lifestyle changes, such as eating healthy foods, exercising more, and reducing your alcohol intake. You may be referred for counseling on a healthy diet and physical activity. Your health care provider may prescribe medicine if lifestyle changes are not enough to get your blood pressure under control and if: Your systolic blood pressure is above 130. Your diastolic blood pressure is above 80. Your personal target blood pressure may vary depending on your medical conditions, your age, and other factors. Follow these instructions at home: Eating and drinking  Eat a diet that is high in fiber and potassium, and low in sodium, added sugar, and fat. An example of this eating plan is called the DASH diet. DASH stands for Dietary Approaches to Stop Hypertension. To eat this way: Eat   plenty of fresh fruits and vegetables. Try to fill one half of your plate at each meal with fruits and vegetables. Eat whole grains, such as whole-wheat pasta, brown rice, or whole-grain bread. Fill about one  fourth of your plate with whole grains. Eat or drink low-fat dairy products, such as skim milk or low-fat yogurt. Avoid fatty cuts of meat, processed or cured meats, and poultry with skin. Fill about one fourth of your plate with lean proteins, such as fish, chicken without skin, beans, eggs, or tofu. Avoid pre-made and processed foods. These tend to be higher in sodium, added sugar, and fat. Reduce your daily sodium intake. Many people with hypertension should eat less than 1,500 mg of sodium a day. Do not drink alcohol if: Your health care provider tells you not to drink. You are pregnant, may be pregnant, or are planning to become pregnant. If you drink alcohol: Limit how much you have to: 0-1 drink a day for women. 0-2 drinks a day for men. Know how much alcohol is in your drink. In the [place] one drink equals one 12 oz bottle of beer (355 mL), one 5 oz glass of wine (148 mL), or one 1 oz glass of hard liquor (44 mL). Lifestyle  Work with your health care provider to maintain a healthy body weight or to lose weight. Ask what an ideal weight is for you. Get at least 30 minutes of exercise that causes your heart to beat faster (aerobic exercise) most days of the week. Activities may include walking, swimming, or biking. Include exercise to strengthen your muscles (resistance exercise), such as Pilates or lifting weights, as part of your weekly exercise routine. Try to do these types of exercises for 30 minutes at least 3 days a week. Do not use any products that contain nicotine or tobacco. These products include cigarettes, chewing tobacco, and vaping devices, such as e-cigarettes. If you need help quitting, ask your health care provider. Monitor your blood pressure at home as told by your health care provider. Keep all follow-up visits. This is important. Medicines Take over-the-counter and prescription medicines only as told by your health care provider. Follow directions carefully. Blood  pressure medicines must be taken as prescribed. Do not skip doses of blood pressure medicine. Doing this puts you at risk for problems and can make the medicine less effective. Ask your health care provider about side effects or reactions to medicines that you should watch for. Contact a health care provider if you: Think you are having a reaction to a medicine you are taking. Have headaches that keep coming back (recurring). Feel dizzy. Have swelling in your ankles. Have trouble with your vision. Get help right away if you: Develop a severe headache or confusion. Have unusual weakness or numbness. Feel faint. Have severe pain in your chest or abdomen. Vomit repeatedly. Have trouble breathing. These symptoms may be an emergency. Get help right away. Call 911. Do not wait to see if the symptoms will go away. Do not drive yourself to the hospital. Summary Hypertension is when the force of blood pumping through your arteries is too strong. If this condition is not controlled, it may put you at risk for serious complications. Your personal target blood pressure may vary depending on your medical conditions, your age, and other factors. For most people, a normal blood pressure is less than 120/80. Hypertension is treated with lifestyle changes, medicines, or a combination of both. Lifestyle changes include losing weight, eating a healthy,   low-sodium diet, exercising more, and limiting alcohol. This information is not intended to replace advice given to you by your health care provider. Make sure you discuss any questions you have with your health care provider. Document Revised: 03/10/2021 Document Reviewed: 03/10/2021 Elsevier Patient Education  [address]   [facility] [phone] call for an appt  [facility]   [phone] - call them for your ultrasound for your neck.

## 2022-05-04 LAB — LIPID PANEL
Chol/HDL Ratio: 5.1 ratio — ABNORMAL HIGH (ref 0.0–5.0)
Cholesterol, Total: 244 mg/dL — ABNORMAL HIGH (ref 100–199)
HDL: 48 mg/dL (ref >39)
LDL Chol Calc (NIH): 160 mg/dL — ABNORMAL HIGH (ref 0–99)
Triglycerides: 194 mg/dL — ABNORMAL HIGH (ref 0–149)
VLDL Cholesterol Cal: 36 mg/dL (ref 5–40)

## 2022-05-04 LAB — BMP8+EGFR
BUN/Creatinine Ratio: 15 (ref 9–20)
BUN: 14 mg/dL (ref 6–24)
CO2: 23 mmol/L (ref 20–29)
Calcium: 10.5 mg/dL — ABNORMAL HIGH (ref 8.7–10.2)
Chloride: 104 mmol/L (ref 96–106)
Creatinine, Ser: 0.96 mg/dL (ref 0.76–1.27)
Glucose: 122 mg/dL — ABNORMAL HIGH (ref 70–99)
Potassium: 4.7 mmol/L (ref 3.5–5.2)
Sodium: 138 mmol/L (ref 134–144)
eGFR: 95 mL/min/{1.73_m2} (ref >59)

## 2022-05-04 LAB — HEMOGLOBIN A1C
Est. average glucose Bld gHb Est-mCnc: 131 mg/dL
Hgb A1c MFr Bld: 6.2 % — ABNORMAL HIGH (ref 4.8–5.6)

## 2022-05-04 LAB — THYROID PANEL WITH TSH
Free Thyroxine Index: 5.1 — ABNORMAL HIGH (ref 1.2–4.9)
T3 Uptake Ratio: 38 % (ref 24–39)
T4, Total: 13.3 ug/dL — ABNORMAL HIGH (ref 4.5–12.0)
TSH: <0.005 u[IU]/mL — ABNORMAL LOW (ref 0.450–4.500)

## 2022-05-14 ENCOUNTER — Encounter: Payer: Self-pay | Admitting: Nurse Practitioner

## 2022-05-16 MED ORDER — METHIMAZOLE 5 MG PO TABS: 5.0000 mg | ORAL_TABLET | Freq: Three times a day (TID) | ORAL | 2 refills | Status: DC

## 2022-05-25 ENCOUNTER — Encounter: Payer: Self-pay | Admitting: Nurse Practitioner

## 2022-05-25 ENCOUNTER — Telehealth: Payer: Self-pay | Admitting: Behavioral Health

## 2022-05-25 ENCOUNTER — Other Ambulatory Visit: Payer: Self-pay | Admitting: Nurse Practitioner

## 2022-05-25 DIAGNOSIS — Z8639 Personal history of other endocrine, nutritional and metabolic disease: Secondary | ICD-10-CM

## 2022-05-25 DIAGNOSIS — R946 Abnormal results of thyroid function studies: Secondary | ICD-10-CM

## 2022-05-25 NOTE — Telephone Encounter (Signed)
Filled 12/14 due 1/11

## 2022-05-25 NOTE — Telephone Encounter (Signed)
Pt's wife called to request RF of Adderall 15mg  3 per day. Has [facility] now and needs to use [facility] [address] in [place], [place] Appt 1/16

## 2022-05-26 ENCOUNTER — Ambulatory Visit (INDEPENDENT_AMBULATORY_CARE_PROVIDER_SITE_OTHER): Payer: BC Managed Care – PPO | Admitting: Nurse Practitioner

## 2022-05-26 ENCOUNTER — Encounter: Payer: Self-pay | Admitting: Nurse Practitioner

## 2022-05-26 VITALS — BP 150/88 | HR 83 | Temp 98.1°F | Ht 74.0 in | Wt 251.0 lb

## 2022-05-26 DIAGNOSIS — Z720 Tobacco use: Secondary | ICD-10-CM

## 2022-05-26 DIAGNOSIS — Z01810 Encounter for preprocedural cardiovascular examination: Secondary | ICD-10-CM | POA: Diagnosis not present

## 2022-05-26 DIAGNOSIS — I10 Essential (primary) hypertension: Secondary | ICD-10-CM

## 2022-05-26 DIAGNOSIS — Z8639 Personal history of other endocrine, nutritional and metabolic disease: Secondary | ICD-10-CM | POA: Diagnosis not present

## 2022-05-26 DIAGNOSIS — Z2821 Immunization not carried out because of patient refusal: Secondary | ICD-10-CM

## 2022-05-26 MED ORDER — METHIMAZOLE 5 MG PO TABS: 5.0000 mg | ORAL_TABLET | Freq: Three times a day (TID) | ORAL | 1 refills | Status: DC

## 2022-05-26 NOTE — Patient Instructions (Signed)
GENERAL PRE-OPERATIVE PATIENT INSTRUCTIONS  Place appropriate patient instructions here.

## 2022-05-26 NOTE — Progress Notes (Signed)
I,Tianna Badgett,acting as a Education administrator for [employer], FNP.,have documented all relevant documentation on the behalf of Minette Brine, FNP,as directed by  Minette Brine, FNP while in the presence of Minette Brine, [place].  Subjective:     Patient ID: Steven Gibson , male    DOB: Oct 06, 1970 , 52 y.o.   MRN: [id number]   Chief Complaint  Patient presents with   Pre-op Exam    HPI  Patient presents today for preoperative exam. He is to left knee redone with rods to his femur after an accident with a cart while working on his previous knee replacement.      Past Medical History:  Diagnosis Date   Arthritis    Arthrofibrosis of knee joint, left    post op TKA    History of Graves' disease 2008   s/p  RAI  same year , on medication for a year then stablized no meds since  (per pt followed by pcp)   Hyperlipidemia    Hypertension    followed by pcp   (in care everywhere pt had stress echo, 03-26-2016, negative for ishemia and echo normal)   Wears glasses      Family History  Problem Relation Age of Onset   Hypertension Mother    Hypertension Father    Hypertension Brother    Cancer Paternal Grandfather    Hypertension Brother      Current Outpatient Medications:    amLODipine (NORVASC) 10 MG tablet, Take 1 tablet (10 mg total) by mouth daily., Disp: 90 tablet, Rfl: 1   amphetamine-dextroamphetamine (ADDERALL) 15 MG tablet, Take 1 tablet by mouth 3 (three) times daily., Disp: 90 tablet, Rfl: 0   clobetasol cream (TEMOVATE) 0.05 %, Apply topically 2 (two) times daily., Disp: 30 g, Rfl: 0   escitalopram (LEXAPRO) 20 MG tablet, Take 20 mg by mouth daily., Disp: , Rfl:    hydrALAZINE (APRESOLINE) 25 MG tablet, Take 1 tablet twice daily. One tab in the morning, One tab at night., Disp: 90 tablet, Rfl: 1   lisinopril-hydrochlorothiazide (ZESTORETIC) 20-25 MG tablet, Take 1 tablet by mouth daily., Disp: 90 tablet, Rfl: 1   meloxicam (MOBIC) 15 MG tablet, Take 1 tablet (15 mg  total) by mouth daily., Disp: 90 tablet, Rfl: 1   methimazole (TAPAZOLE) 5 MG tablet, TAKE 1 TABLET BY MOUTH THREE TIMES A DAY, Disp: 270 tablet, Rfl: 1   Potassium Chloride (K+ POTASSIUM PO), Take by mouth. Over the counter, per pt takes for leg cramps, Disp: , Rfl:    pravastatin (PRAVACHOL) 20 MG tablet, Take 1 tablet (20 mg total) by mouth daily., Disp: 90 tablet, Rfl: 1   No Known Allergies   Review of Systems  Constitutional: Negative.   Respiratory: Negative.    Cardiovascular: Negative.   Gastrointestinal: Negative.   Neurological: Negative.      Today's Vitals   05/26/22 1039 05/26/22 1127  BP: (!) 168/92 (!) 150/88  Pulse: 83   Temp: 98.1 F (36.7 C)   TempSrc: Oral   Weight: 251 lb (113.9 kg)   Height: 6\' 2"  (1.88 m)    Body mass index is 32.23 kg/m.   Objective:  Physical Exam Vitals reviewed.  Constitutional:      General: He is not in acute distress.    Appearance: Normal appearance.  Cardiovascular:     Rate and Rhythm: Normal rate and regular rhythm.     Pulses: Normal pulses.     Heart sounds: Normal heart sounds.  No murmur heard. Pulmonary:     Effort: Pulmonary effort is normal. No respiratory distress.     Breath sounds: Normal breath sounds.  Skin:    General: Skin is warm and dry.     Capillary Refill: Capillary refill takes less than 2 seconds.  Neurological:     General: No focal deficit present.     Mental Status: He is alert and oriented to person, place, and time.     Cranial Nerves: No cranial nerve deficit.     Motor: No weakness.  Psychiatric:        Mood and Affect: Mood normal.        Behavior: Behavior normal.        Thought Content: Thought content normal.         Assessment And Plan:     1. Uncontrolled hypertension Comments: Blood pressure is slightly elevated, repeat is improved but still elevated, return on Tuesday for NV BP check. Encouraged to cut back on smoking cigarettes.  2. Pre-operative cardiovascular  examination Comments: EKG done with NSR, blood pressure elevated today will return on Tuesday for NV BP check before clearing for surgery, called wife and made aware - EKG 12-Lead  3. H/O Graves' disease Comments: He is taking methimazole and referral is being changed due to different insurance  4. Tetanus, diphtheria, and acellular pertussis (Tdap) vaccination declined Comments: Discussed risk of infection if has cut/laceration with rusty nail or any other item that is not well cleaned  5. Tobacco abuse Smoking cessation instruction/counseling given:  counseled patient on the dangers of tobacco use, advised patient to stop smoking, and reviewed strategies to maximize success   Patient was given opportunity to ask questions. Patient verbalized understanding of the plan and was able to repeat key elements of the plan. All questions were answered to their satisfaction.  Minette Brine, FNP   I, Minette Brine, FNP, have reviewed all documentation for this visit. The documentation on 05/26/22 for the exam, diagnosis, procedures, and orders are all accurate and complete.   IF YOU HAVE BEEN REFERRED TO A SPECIALIST, IT MAY TAKE 1-2 WEEKS TO SCHEDULE/PROCESS THE REFERRAL. IF YOU HAVE NOT HEARD FROM US/SPECIALIST IN TWO WEEKS, PLEASE GIVE [place] A CALL AT [phone] X [phone].   THE PATIENT IS ENCOURAGED TO PRACTICE SOCIAL DISTANCING DUE TO THE COVID-19 PANDEMIC.

## 2022-05-27 ENCOUNTER — Telehealth: Payer: Self-pay

## 2022-05-27 ENCOUNTER — Other Ambulatory Visit: Payer: Self-pay | Admitting: Nurse Practitioner

## 2022-05-27 ENCOUNTER — Other Ambulatory Visit: Payer: Self-pay

## 2022-05-27 DIAGNOSIS — Z8639 Personal history of other endocrine, nutritional and metabolic disease: Secondary | ICD-10-CM

## 2022-05-27 MED ORDER — AMPHETAMINE-DEXTROAMPHETAMINE 15 MG PO TABS: 15.0000 mg | ORAL_TABLET | Freq: Three times a day (TID) | ORAL | 0 refills | Status: DC

## 2022-05-27 NOTE — Telephone Encounter (Signed)
Pended.

## 2022-05-27 NOTE — Telephone Encounter (Addendum)
Prior Authorization Amphetamine-Dextroamphetamine 15MG  tablets #90  [employer]  Approved Effective:  05/27/22-05/27/25

## 2022-06-01 ENCOUNTER — Ambulatory Visit: Payer: Managed Care, Other (non HMO)

## 2022-06-01 ENCOUNTER — Ambulatory Visit (INDEPENDENT_AMBULATORY_CARE_PROVIDER_SITE_OTHER): Payer: BC Managed Care – PPO | Admitting: Behavioral Health

## 2022-06-01 ENCOUNTER — Encounter: Payer: Self-pay | Admitting: Behavioral Health

## 2022-06-01 VITALS — BP 130/98 | HR 78 | Temp 98.1°F | Ht 74.0 in | Wt 251.0 lb

## 2022-06-01 VITALS — BP 140/98 | HR 81

## 2022-06-01 DIAGNOSIS — F411 Generalized anxiety disorder: Secondary | ICD-10-CM

## 2022-06-01 DIAGNOSIS — I10 Essential (primary) hypertension: Secondary | ICD-10-CM

## 2022-06-01 DIAGNOSIS — F900 Attention-deficit hyperactivity disorder, predominantly inattentive type: Secondary | ICD-10-CM | POA: Diagnosis not present

## 2022-06-01 DIAGNOSIS — F331 Major depressive disorder, recurrent, moderate: Secondary | ICD-10-CM | POA: Diagnosis not present

## 2022-06-01 MED ORDER — HYDRALAZINE HCL 25 MG PO TABS: ORAL_TABLET | ORAL | 1 refills | Status: DC

## 2022-06-01 NOTE — Progress Notes (Signed)
[facility]  Patient ID: Steven Gibson,  MRN: [id number]  PCP: Minette Brine, FNP  Date of Evaluation: 06/01/2022 Time spent:30 minutes  Chief Complaint:  Chief Complaint   Depression; Anxiety; Follow-up; Patient Education; Medication Refill     HISTORY/CURRENT STATUS: HPI  "Steven Gibson", 52 year old African-American male presents to this office for follow up and medication management. No social changes this visit. He is very happy with his care, just anticipating his knee surgery in February.   Says that his medications are working fine for now and that he would only need refills.  He is currently not working due to an injury to his left knee requiring surgery next month.  He says that he has also been diagnosed with Graves' disease.  He says he is currently stable and not requesting any changes to his medication regimen.    He reports anxiety at 2/10, and depression at 2/10.  He says that being at home not being able to stay active working bothers him, but he understands that this will improve once he gets his knee surgery.  He is sleeping 7 to 8 hours per night.  He denies any history of mania or psychosis.  No history of auditory or visual hallucinations.  No SI or HI.   Past psychiatric medication trials: Wellbutrin Vyvanse    Individual Medical History/ Review of Systems: Changes? :No   Allergies: Patient has no known allergies.  Current Medications:  Current Outpatient Medications:    amLODipine (NORVASC) 10 MG tablet, Take 1 tablet (10 mg total) by mouth daily., Disp: 90 tablet, Rfl: 1   amphetamine-dextroamphetamine (ADDERALL) 15 MG tablet, Take 1 tablet by mouth 3 (three) times daily., Disp: 90 tablet, Rfl: 0   clobetasol cream (TEMOVATE) 0.05 %, Apply topically 2 (two) times daily., Disp: 30 g, Rfl: 0   escitalopram (LEXAPRO) 20 MG tablet, Take 20 mg by mouth daily., Disp: , Rfl:    lisinopril-hydrochlorothiazide (ZESTORETIC) 20-25 MG tablet, Take 1  tablet by mouth daily., Disp: 90 tablet, Rfl: 1   meloxicam (MOBIC) 15 MG tablet, Take 1 tablet (15 mg total) by mouth daily., Disp: 90 tablet, Rfl: 1   methimazole (TAPAZOLE) 5 MG tablet, TAKE 1 TABLET BY MOUTH THREE TIMES A DAY, Disp: 270 tablet, Rfl: 1   Potassium Chloride (K+ POTASSIUM PO), Take by mouth. Over the counter, per pt takes for leg cramps, Disp: , Rfl:    pravastatin (PRAVACHOL) 20 MG tablet, Take 1 tablet (20 mg total) by mouth daily., Disp: 90 tablet, Rfl: 1 Medication Side Effects: none  Family Medical/ Social History: Changes? No  MENTAL HEALTH EXAM:  There were no vitals taken for this visit.There is no height or weight on file to calculate BMI.  General Appearance: Casual, Neat, and Well Groomed  Eye Contact:  Good  Speech:  Clear and Coherent  Volume:  Normal  Mood:  NA  Affect:  Appropriate  Thought Process:  Coherent  Orientation:  Full (Time, Place, and Person)  Thought Content: Logical   Suicidal Thoughts:  No  Homicidal Thoughts:  No  Memory:  WNL  Judgement:  Good  Insight:  Good  Psychomotor Activity:  Normal  Concentration:  Concentration: Good  Recall:  Good  Fund of Knowledge: Good  Language: Good  Assets:  Desire for Improvement  ADL's:  Intact  Cognition: WNL  Prognosis:  Good    DIAGNOSES: No diagnosis found.  Receiving Psychotherapy: No    RECOMMENDATIONS:   Greater than 50%  of face to face time with patient was spent on counseling and coordination of care.  No changes this visit. He continues to enjoy very good stability.  No changes or adjustments to medication indicated at this time. Educated pt importance of following up with is PCP with BP concerns. It was elevated today. He said that he smoked a cigarette and believes that to be the cause. We agreed to: Will continue Adderall 15 mg three time daily as needed To continue Lexapro 20 mg daily To report side effects or worsening symtoms To follow up in 8 weeks to  reassess Provided emergency contact information Reviewed [place], NP

## 2022-06-01 NOTE — Progress Notes (Signed)
Pt presents today for bpc. He is currently taking Amlodipine 10MG  & Lisinopril-HCTZ 20-25 this morning, around 8am. He states "hating water". He did have 2 bottles of water today. He did smoke a cigarette around 12:30 today. He states also buying a BP monitor today. Provided added Hydralazine 25MG , patient aware to take one tab in the morning, one tab at night. Medication sent, patient will come back next Tuesday for bpc.

## 2022-06-03 ENCOUNTER — Other Ambulatory Visit: Payer: Self-pay | Admitting: Nurse Practitioner

## 2022-06-05 ENCOUNTER — Encounter: Payer: Self-pay | Admitting: Nurse Practitioner

## 2022-06-07 ENCOUNTER — Other Ambulatory Visit: Payer: Self-pay | Admitting: Nurse Practitioner

## 2022-06-08 ENCOUNTER — Other Ambulatory Visit: Payer: Self-pay

## 2022-06-08 ENCOUNTER — Ambulatory Visit: Payer: Managed Care, Other (non HMO)

## 2022-06-08 MED ORDER — HYDRALAZINE HCL 25 MG PO TABS: ORAL_TABLET | ORAL | 1 refills | Status: DC

## 2022-06-15 ENCOUNTER — Ambulatory Visit: Payer: Medicaid Other

## 2022-06-15 VITALS — BP 138/92 | HR 81 | Temp 98.1°F | Ht 74.0 in | Wt 251.0 lb

## 2022-06-15 DIAGNOSIS — I10 Essential (primary) hypertension: Secondary | ICD-10-CM

## 2022-06-15 NOTE — Progress Notes (Signed)
Pt presents today for bpc. He reports taking Amlodipine 10MG  & Lisinopril-HCTZ 20-25 this morning before apt around 730am. He states drinking at least 2 bottles of water. Denies headache, chest pain, dizziness. He admits not having a cigarette this morning.  BP Readings from Last 3 Encounters:  06/15/22 (!) 138/92  06/01/22 (!) 130/98  05/26/22 (!) 150/88   Initial bp: 142/96. Patient cleared, per provider we will follow up at next appointment.

## 2022-06-17 ENCOUNTER — Ambulatory Visit (INDEPENDENT_AMBULATORY_CARE_PROVIDER_SITE_OTHER): Payer: Medicaid Other | Admitting: Internal Medicine

## 2022-06-17 ENCOUNTER — Encounter: Payer: Self-pay | Admitting: Internal Medicine

## 2022-06-17 VITALS — BP 134/80 | HR 95 | Ht 74.0 in | Wt 243.0 lb

## 2022-06-17 DIAGNOSIS — E0500 Thyrotoxicosis with diffuse goiter without thyrotoxic crisis or storm: Secondary | ICD-10-CM

## 2022-06-17 DIAGNOSIS — E0590 Thyrotoxicosis, unspecified without thyrotoxic crisis or storm: Secondary | ICD-10-CM

## 2022-06-17 DIAGNOSIS — Z8639 Personal history of other endocrine, nutritional and metabolic disease: Secondary | ICD-10-CM

## 2022-06-17 LAB — CBC WITH DIFFERENTIAL/PLATELET
Basophils Absolute: 0.1 10*3/uL (ref 0.0–0.1)
Basophils Relative: 1.3 % (ref 0.0–3.0)
Eosinophils Absolute: 0.1 10*3/uL (ref 0.0–0.7)
Eosinophils Relative: 1.9 % (ref 0.0–5.0)
HCT: 45.5 % (ref 39.0–52.0)
Hemoglobin: 15.3 g/dL (ref 13.0–17.0)
Lymphocytes Relative: 35.2 % (ref 12.0–46.0)
Lymphs Abs: 1.8 10*3/uL (ref 0.7–4.0)
MCHC: 33.7 g/dL (ref 30.0–36.0)
MCV: 85.6 fl (ref 78.0–100.0)
Monocytes Absolute: 0.5 10*3/uL (ref 0.1–1.0)
Monocytes Relative: 9.7 % (ref 3.0–12.0)
Neutro Abs: 2.6 10*3/uL (ref 1.4–7.7)
Neutrophils Relative %: 51.9 % (ref 43.0–77.0)
Platelets: 308 10*3/uL (ref 150.0–400.0)
RBC: 5.32 Mil/uL (ref 4.22–5.81)
RDW: 15 % (ref 11.5–15.5)
WBC: 5.1 10*3/uL (ref 4.0–10.5)

## 2022-06-17 LAB — T4, FREE: Free T4: 0.73 ng/dL (ref 0.60–1.60)

## 2022-06-17 LAB — COMPREHENSIVE METABOLIC PANEL
ALT: 36 U/L (ref 0–53)
AST: 35 U/L (ref 0–37)
Albumin: 4.8 g/dL (ref 3.5–5.2)
Alkaline Phosphatase: 135 U/L — ABNORMAL HIGH (ref 39–117)
BUN: 11 mg/dL (ref 6–23)
CO2: 29 mEq/L (ref 19–32)
Calcium: 9.8 mg/dL (ref 8.4–10.5)
Chloride: 101 mEq/L (ref 96–112)
Creatinine, Ser: 1.14 mg/dL (ref 0.40–1.50)
GFR: 74.11 mL/min (ref >60.00)
Glucose, Bld: 109 mg/dL — ABNORMAL HIGH (ref 70–99)
Potassium: 3.4 mEq/L — ABNORMAL LOW (ref 3.5–5.1)
Sodium: 138 mEq/L (ref 135–145)
Total Bilirubin: 0.4 mg/dL (ref 0.2–1.2)
Total Protein: 7.9 g/dL (ref 6.0–8.3)

## 2022-06-17 LAB — TSH: TSH: 11.77 u[IU]/mL — ABNORMAL HIGH (ref 0.35–5.50)

## 2022-06-17 NOTE — Progress Notes (Signed)
Name: Steven Gibson  MRN/ DOB: [id number], February 18, 1971    Age/ Sex: 52 y.o., male    PCP: Minette Brine, FNP   Reason for Endocrinology Evaluation: Graves' Disease     Date of Initial Endocrinology Evaluation: 06/17/2022     HPI: [name] is a 52 y.o. male with a past medical history of HTN and dylipidemia . The patient presented for initial endocrinology clinic visit on 06/17/2022 for consultative assistance with his Graves' Disease.    He was diagnosed with graves' disease > 20 yrs ago, he was initially on Methimazole but  subsequently was treated with RAI ablation With normalization of TFT until he presented with hyperthyroidism in12/2023 with a suppressed TSH <0.005 uIU/mL with elevated total T4 13.3ug/dL   Pt was started on Methimazole 04/2022  Denies weight loss Denies local neck swelling  loose stools and diarrhea have resolved  Denies tremors  No biotin  Has left eye orbitopathy - with dryness - last exam 05/2022    Fater with Grave;s disease and 2 daughter   Methimazole 5 mg TID      HISTORY:  Past Medical History:  Past Medical History:  Diagnosis Date  . Arthritis   . Arthrofibrosis of knee joint, left    post op TKA   . History of Graves' disease 2008   s/p  RAI  same year , on medication for a year then stablized no meds since  (per pt followed by pcp)  . Hyperlipidemia   . Hypertension    followed by pcp   (in care everywhere pt had stress echo, 03-26-2016, negative for ishemia and echo normal)  . Wears glasses    Past Surgical History:  Past Surgical History:  Procedure Laterality Date  . ANTERIOR CRUCIATE LIGAMENT REPAIR Left 1990S  . KNEE ARTHROSCOPY W/ MEDIAL COLLATERAL LIGAMENT (MCL) REPAIR Left 1990s  . KNEE ARTHROSCOPY W/ MENISCAL REPAIR Left 1990s  . KNEE CLOSED REDUCTION Left 10/21/2020   Procedure: CLOSED MANIPULATION LEFT KNEE;  Surgeon: Marchia Bond, MD;  Location: [facility];  Service:  Orthopedics;  Laterality: Left;  . LIPOMA EXCISION     LEF SHOULDER AREA  . TOTAL KNEE ARTHROPLASTY Left 05/06/2020   Procedure: TOTAL KNEE ARTHROPLASTY;  Surgeon: Marchia Bond, MD;  Location: [facility];  Service: Orthopedics;  Laterality: Left;  . UMBILICAL HERNIA REPAIR  07-17-2015  @[email or web address]    AND RIGHT ABDOMINAL WALL EXCISION LIPOMA    Social History:  reports that he has been smoking cigarettes. He has a 12.50 pack-year smoking history. He quit smokeless tobacco use about 33 years ago. He reports current alcohol use. He reports that he does not use drugs. Family History: family history includes Cancer in his paternal grandfather; Hypertension in his brother, brother, father, and mother.   HOME MEDICATIONS: Allergies as of 06/17/2022   No Known Allergies      Medication List        Accurate as of June 17, 2022  7:43 AM. If you have any questions, ask your nurse or doctor.          amLODipine 10 MG tablet Commonly known as: NORVASC Take 1 tablet (10 mg total) by mouth daily.   amphetamine-dextroamphetamine 15 MG tablet Commonly known as: Adderall Take 1 tablet by mouth 3 (three) times daily.   clobetasol cream 0.05 % Commonly known as: TEMOVATE Apply topically 2 (two) times daily.   escitalopram 20 MG tablet Commonly known as: LEXAPRO Take  20 mg by mouth daily.   hydrALAZINE 25 MG tablet Commonly known as: APRESOLINE Take 1 tablet twice daily. One tab in the morning, One tab at night.   K+ POTASSIUM PO Take by mouth. Over the counter, per pt takes for leg cramps   lisinopril-hydrochlorothiazide 20-25 MG tablet Commonly known as: ZESTORETIC Take 1 tablet by mouth daily.   meloxicam 15 MG tablet Commonly known as: MOBIC Take 1 tablet (15 mg total) by mouth daily.   methimazole 5 MG tablet Commonly known as: TAPAZOLE TAKE 1 TABLET BY MOUTH THREE TIMES A DAY   pravastatin 20 MG tablet Commonly known as: PRAVACHOL Take 1 tablet (20 mg total) by mouth  daily.          REVIEW OF SYSTEMS: A comprehensive ROS was conducted with the patient and is negative except as per HPI and     OBJECTIVE:  VS: BP 134/80 (BP Location: Left Arm, Patient Position: Sitting, Cuff Size: Large)   Pulse 95   Ht 6\' 2"  (1.88 m)   Wt 243 lb (110.2 kg)   SpO2 96%   BMI 31.20 kg/m    Wt Readings from Last 3 Encounters:  06/15/22 251 lb (113.9 kg)  06/01/22 251 lb (113.9 kg)  05/26/22 251 lb (113.9 kg)     EXAM: General: Pt appears well and is in NAD  Eyes: External eye exam with a stare, left eye  exophthalmos.  EOM intact.    Neck: General: Supple without adenopathy. Thyroid: Thyroid size normal.  No goiter or nodules appreciated. No thyroid bruit.  Lungs: Clear with good BS bilat with no rales, rhonchi, or wheezes  Heart: Auscultation: RRR.  Abdomen: soft, nontender, without masses or organomegaly palpable  Extremities:  BL LE: No pretibial edema normal ROM and strength.  Mental Status: Judgment, insight: Intact Orientation: Oriented to time, place, and person Mood and affect: No depression, anxiety, or agitation     DATA REVIEWED:     Latest Reference Range & Units 06/17/22 12:21  Sodium 135 - 145 mEq/L 138  Potassium 3.5 - 5.1 mEq/L 3.4 (L)  Chloride 96 - 112 mEq/L 101  CO2 19 - 32 mEq/L 29  Glucose 70 - 99 mg/dL 109 (H)  BUN 6 - 23 mg/dL 11  Creatinine 0.40 - 1.50 mg/dL 1.14  Calcium 8.4 - 10.5 mg/dL 9.8  Alkaline Phosphatase 39 - 117 U/L 135 (H)  Albumin 3.5 - 5.2 g/dL 4.8  AST 0 - 37 U/L 35  ALT 0 - 53 U/L 36  Total Protein 6.0 - 8.3 g/dL 7.9  Total Bilirubin 0.2 - 1.2 mg/dL 0.4  GFR >60.00 mL/min 74.11    Latest Reference Range & Units 06/17/22 12:21  WBC 4.0 - 10.5 K/uL 5.1  RBC 4.22 - 5.81 Mil/uL 5.32  Hemoglobin 13.0 - 17.0 g/dL 15.3  HCT 39.0 - 52.0 % 45.5  MCV 78.0 - 100.0 fl 85.6  MCHC 30.0 - 36.0 g/dL 33.7  RDW 11.5 - 15.5 % 15.0  Platelets 150.0 - 400.0 K/uL 308.0  Neutrophils 43.0 - 77.0 % 51.9   Lymphocytes 12.0 - 46.0 % 35.2  Monocytes Relative 3.0 - 12.0 % 9.7  Eosinophil 0.0 - 5.0 % 1.9  Basophil 0.0 - 3.0 % 1.3  NEUT# 1.4 - 7.7 K/uL 2.6  Lymphocyte # 0.7 - 4.0 K/uL 1.8  Monocyte # 0.1 - 1.0 K/uL 0.5  Eosinophils Absolute 0.0 - 0.7 K/uL 0.1  Basophils Absolute 0.0 - 0.1 K/uL 0.1  Glucose 70 -  99 mg/dL 109 (H)  TSH 0.35 - 5.50 uIU/mL 11.77 (H)  T4,Free(Direct) 0.60 - 1.60 ng/dL 0.73  Old records , labs and images have been reviewed.   ASSESSMENT/PLAN/RECOMMENDATIONS:   Hyperthyroidism:  -This is due to Graves' disease -He is s/p RAI over 2 decades ago with normalization of TFTs until 04/2022 when he presented with recurrence -Patient is clinically euthyroid -TSH elevated, will reduce methimazole as below -We did discuss alternative options to include continuation of thionamides therapy, versus another RAI versus total thyroidectomy.  Given left orbitopathy I would  NOT recommend proceeding with RAI, so his only option is [place] therapy versus total thyroidectomy -Patient opted to remain on [place] therapy at this time    Medications : Decrease methimazole 5 mg twice daily  2.  Graves' disease:  -Patient with Graves' orbitopathy, a referral has been placed to ophthalmology    F/U in 4 months Labs in 2 months  Signed electronically by: Mack Guise, MD  [facility]  [address] [place], [place] [address] Phone: [phone] FAX: [phone]   CC: Minette Brine, [place] [address] Phone: [phone] Fax: [phone]   Return to Endocrinology clinic as below: Future Appointments  Date Time Provider [place]  06/17/2022 11:50 AM Gurleen Larrivee, Melanie Crazier, MD [facility] None  08/31/2022  8:00 AM Elwanda Brooklyn, NP [facility] None  09/02/2022 10:40 AM Minette Brine, FNP [facility] None  12/27/2022 10:00 AM Minette Brine, FNP [facility] None

## 2022-06-18 MED ORDER — METHIMAZOLE 5 MG PO TABS: 5.0000 mg | ORAL_TABLET | Freq: Two times a day (BID) | ORAL | 2 refills | Status: DC

## 2022-06-21 LAB — TRAB (TSH RECEPTOR BINDING ANTIBODY): TRAB: <1 IU/L (ref <=2.00)

## 2022-06-25 ENCOUNTER — Encounter: Payer: Self-pay | Admitting: Nurse Practitioner

## 2022-06-25 ENCOUNTER — Other Ambulatory Visit: Payer: Self-pay

## 2022-06-25 ENCOUNTER — Telehealth: Payer: Self-pay | Admitting: Behavioral Health

## 2022-06-25 DIAGNOSIS — I10 Essential (primary) hypertension: Secondary | ICD-10-CM

## 2022-06-25 MED ORDER — HYDRALAZINE HCL 25 MG PO TABS: ORAL_TABLET | ORAL | 1 refills | Status: DC

## 2022-06-25 MED ORDER — ESCITALOPRAM OXALATE 20 MG PO TABS: 20.0000 mg | ORAL_TABLET | Freq: Every day | ORAL | 1 refills | Status: DC

## 2022-06-25 MED ORDER — LISINOPRIL-HYDROCHLOROTHIAZIDE 20-25 MG PO TABS: 1.0000 | ORAL_TABLET | Freq: Every day | ORAL | 1 refills | Status: DC

## 2022-06-25 MED ORDER — AMPHETAMINE-DEXTROAMPHETAMINE 15 MG PO TABS: 15.0000 mg | ORAL_TABLET | Freq: Three times a day (TID) | ORAL | 0 refills | Status: DC

## 2022-06-25 MED ORDER — AMLODIPINE BESYLATE 10 MG PO TABS: 10.0000 mg | ORAL_TABLET | Freq: Every day | ORAL | 1 refills | Status: DC

## 2022-06-25 MED ORDER — MELOXICAM 15 MG PO TABS: 15.0000 mg | ORAL_TABLET | Freq: Every day | ORAL | 1 refills | Status: DC

## 2022-06-25 NOTE — Telephone Encounter (Signed)
Pended.

## 2022-06-25 NOTE — Telephone Encounter (Signed)
Pt called asking for a refill on his adderall 15 mg.[facility] [address] [facility] in [facility]

## 2022-07-22 ENCOUNTER — Encounter: Payer: Self-pay | Admitting: Nurse Practitioner

## 2022-07-23 ENCOUNTER — Telehealth: Payer: Self-pay | Admitting: Behavioral Health

## 2022-07-23 NOTE — Telephone Encounter (Signed)
Pt's wife called at 4:45p.  She is on DPR.  The pt would like refill [facility] [address] It is in stock there.  It is urgent as he is out today.  He thought the scripts were sent in automatically each month.  If possible, they would like for you to put in 3 scripts, one for each month.  Next appt 4/16

## 2022-07-26 ENCOUNTER — Other Ambulatory Visit: Payer: Self-pay | Admitting: Behavioral Health

## 2022-07-26 DIAGNOSIS — F900 Attention-deficit hyperactivity disorder, predominantly inattentive type: Secondary | ICD-10-CM

## 2022-07-26 MED ORDER — AMPHETAMINE-DEXTROAMPHETAMINE 15 MG PO TABS: 15.0000 mg | ORAL_TABLET | Freq: Three times a day (TID) | ORAL | 0 refills | Status: DC

## 2022-07-29 ENCOUNTER — Other Ambulatory Visit: Payer: Self-pay

## 2022-08-13 ENCOUNTER — Other Ambulatory Visit: Payer: BC Managed Care – PPO

## 2022-08-23 ENCOUNTER — Other Ambulatory Visit: Payer: Self-pay | Admitting: Behavioral Health

## 2022-08-23 ENCOUNTER — Telehealth: Payer: Self-pay | Admitting: Behavioral Health

## 2022-08-23 DIAGNOSIS — F900 Attention-deficit hyperactivity disorder, predominantly inattentive type: Secondary | ICD-10-CM

## 2022-08-23 MED ORDER — AMPHETAMINE-DEXTROAMPHETAMINE 15 MG PO TABS: 15.0000 mg | ORAL_TABLET | Freq: Three times a day (TID) | ORAL | 0 refills | Status: DC

## 2022-08-23 NOTE — Telephone Encounter (Signed)
Pt's wife is on [place].  She requested refill of [facility] to [facility] [address]  It is available there.  Next appt 4/16

## 2022-08-31 ENCOUNTER — Ambulatory Visit: Payer: Medicaid Other | Admitting: Behavioral Health

## 2022-09-01 DIAGNOSIS — M1990 Unspecified osteoarthritis, unspecified site: Secondary | ICD-10-CM | POA: Insufficient documentation

## 2022-09-02 ENCOUNTER — Ambulatory Visit: Payer: Medicaid Other | Admitting: Nurse Practitioner

## 2022-09-06 ENCOUNTER — Other Ambulatory Visit: Payer: Self-pay

## 2022-09-06 MED ORDER — HYDRALAZINE HCL 25 MG PO TABS: ORAL_TABLET | ORAL | 1 refills | Status: DC

## 2022-09-07 ENCOUNTER — Telehealth: Payer: Self-pay | Admitting: Nurse Practitioner

## 2022-09-07 NOTE — Telephone Encounter (Signed)
Called pt to schedule missed apt on 4/18 no answer left VM

## 2022-09-22 ENCOUNTER — Telehealth: Payer: Self-pay | Admitting: Behavioral Health

## 2022-09-22 ENCOUNTER — Other Ambulatory Visit: Payer: Self-pay | Admitting: Behavioral Health

## 2022-09-22 DIAGNOSIS — F900 Attention-deficit hyperactivity disorder, predominantly inattentive type: Secondary | ICD-10-CM

## 2022-09-22 MED ORDER — AMPHETAMINE-DEXTROAMPHETAMINE 15 MG PO TABS: 15.0000 mg | ORAL_TABLET | Freq: Three times a day (TID) | ORAL | 0 refills | Status: DC

## 2022-09-22 NOTE — Telephone Encounter (Signed)
Wife called to request refill of [place] Adderall.  Appt 5/16.  [facility] to  [id number] [place], [place] - [address] AT [address] Phone: [phone]

## 2022-09-29 ENCOUNTER — Encounter: Payer: Self-pay | Admitting: Nurse Practitioner

## 2022-09-30 ENCOUNTER — Ambulatory Visit: Payer: Medicaid Other | Admitting: Behavioral Health

## 2022-09-30 ENCOUNTER — Other Ambulatory Visit: Payer: Self-pay

## 2022-09-30 DIAGNOSIS — L309 Dermatitis, unspecified: Secondary | ICD-10-CM

## 2022-09-30 MED ORDER — CLOBETASOL PROPIONATE 0.05 % EX CREA: 1.0000 | TOPICAL_CREAM | Freq: Two times a day (BID) | CUTANEOUS | 0 refills | Status: DC

## 2022-10-05 ENCOUNTER — Ambulatory Visit (INDEPENDENT_AMBULATORY_CARE_PROVIDER_SITE_OTHER): Payer: BLUE CROSS/BLUE SHIELD | Admitting: Behavioral Health

## 2022-10-05 ENCOUNTER — Encounter: Payer: Self-pay | Admitting: Behavioral Health

## 2022-10-05 DIAGNOSIS — F3341 Major depressive disorder, recurrent, in partial remission: Secondary | ICD-10-CM | POA: Diagnosis not present

## 2022-10-05 DIAGNOSIS — F411 Generalized anxiety disorder: Secondary | ICD-10-CM | POA: Diagnosis not present

## 2022-10-05 DIAGNOSIS — F900 Attention-deficit hyperactivity disorder, predominantly inattentive type: Secondary | ICD-10-CM

## 2022-10-05 NOTE — Progress Notes (Signed)
[facility]  Patient ID: Steven Gibson,  MRN: [id number]  PCP: [name], FNP  Date of Evaluation: 10/05/2022 Time spent:30 minutes  Chief Complaint:  Chief Complaint   ADHD; Anxiety; Depression; Medication Refill; Patient Education; Follow-up     HISTORY/CURRENT STATUS: HPI  Steven Gibson", 52 year old African-American male presents to this office for follow up and medication management. No social changes this visit. He is very happy with his care. Says that his medications are working fine for now and that he would only need refills.  He says he is currently stable and not requesting any changes to his medication regimen. He is currently working new job at [employer].    He reports anxiety at 2/10, and depression at 2/10.    He is sleeping 7 to 8 hours per night.  He denies any history of mania or psychosis.  No history of auditory or visual hallucinations.  No SI or HI.   Past psychiatric medication trials: Wellbutrin Vyvanse          Individual Medical History/ Review of Systems: Changes? :No   Allergies: Patient has no known allergies.  Current Medications:  Current Outpatient Medications:    amLODipine (NORVASC) 10 MG tablet, Take 1 tablet (10 mg total) by mouth daily., Disp: 90 tablet, Rfl: 1   amphetamine-dextroamphetamine (ADDERALL) 15 MG tablet, Take 1 tablet by mouth 3 (three) times daily., Disp: 90 tablet, Rfl: 0   clobetasol cream (TEMOVATE) 0.05 %, Apply 1 Application topically 2 (two) times daily., Disp: 90 g, Rfl: 0   escitalopram (LEXAPRO) 20 MG tablet, Take 1 tablet (20 mg total) by mouth daily., Disp: 90 tablet, Rfl: 1   hydrALAZINE (APRESOLINE) 25 MG tablet, Take 1 tablet twice daily. One tab in the morning, One tab at night., Disp: 180 tablet, Rfl: 1   lisinopril-hydrochlorothiazide (ZESTORETIC) 20-25 MG tablet, Take 1 tablet by mouth daily., Disp: 90 tablet, Rfl: 1   meloxicam (MOBIC) 15 MG tablet, Take 1 tablet (15 mg total)  by mouth daily., Disp: 90 tablet, Rfl: 1   methimazole (TAPAZOLE) 5 MG tablet, Take 1 tablet (5 mg total) by mouth 2 (two) times daily., Disp: 180 tablet, Rfl: 2   Potassium Chloride (K+ POTASSIUM PO), Take by mouth. Over the counter, per pt takes for leg cramps, Disp: , Rfl:    pravastatin (PRAVACHOL) 20 MG tablet, Take 1 tablet (20 mg total) by mouth daily., Disp: 90 tablet, Rfl: 1 Medication Side Effects: anxiety  Family Medical/ Social History: Changes? No  MENTAL HEALTH EXAM:  There were no vitals taken for this visit.There is no height or weight on file to calculate BMI.  General Appearance: Casual and Well Groomed  Eye Contact:  Good  Speech:  Clear and Coherent  Volume:  Normal  Mood:  NA  Affect:  Appropriate  Thought Process:  Coherent  Orientation:  Full (Time, Place, and Person)  Thought Content: Logical   Suicidal Thoughts:  No  Homicidal Thoughts:  No  Memory:  WNL  Judgement:  Good  Insight:  Good  Psychomotor Activity:  Normal  Concentration:  Concentration: Good  Recall:  Good  Fund of Knowledge: Good  Language: Good  Assets:  Desire for Improvement  ADL's:  Intact  Cognition: WNL  Prognosis:  Good    DIAGNOSES:    ICD-10-CM   1. Attention deficit hyperactivity disorder (ADHD), predominantly inattentive type  F90.0     2. Generalized anxiety disorder  F41.1  3. Recurrent major depressive disorder, in partial remission (HCC)  F33.41       Receiving Psychotherapy: No    RECOMMENDATIONS:   Greater than 50% of face to face time with patient was spent on counseling and coordination of care.  No changes this visit. He continues to enjoy very good stability.  No changes or adjustments to medication indicated at this time. Wife assist in organizing and requesting refills for him. No concerns this visit.  Reinforced importance of monitoring BP regularly.   We agreed to:  Will continue Adderall 15 mg three time daily as needed To continue Lexapro 20  mg daily To report side effects or worsening symtoms To follow up in 6 months  to reassess Provided emergency contact information Reviewed PDMP    Joan Flores, NP

## 2022-10-25 ENCOUNTER — Telehealth: Payer: Self-pay | Admitting: Behavioral Health

## 2022-10-25 ENCOUNTER — Other Ambulatory Visit: Payer: Self-pay | Admitting: Behavioral Health

## 2022-10-25 DIAGNOSIS — F900 Attention-deficit hyperactivity disorder, predominantly inattentive type: Secondary | ICD-10-CM

## 2022-10-25 MED ORDER — AMPHETAMINE-DEXTROAMPHETAMINE 15 MG PO TABS: 15.0000 mg | ORAL_TABLET | Freq: Three times a day (TID) | ORAL | 0 refills | Status: DC

## 2022-10-25 NOTE — Telephone Encounter (Signed)
Pt's wife called at 9:51 for refill of Adderall to   [id number] [place], [place] - [address] [place] [address] Phone: [phone]  Fax: [phone]    It is available there.  Next appt 11/21

## 2022-11-19 ENCOUNTER — Telehealth: Payer: Self-pay | Admitting: Behavioral Health

## 2022-11-19 NOTE — Telephone Encounter (Signed)
Next visit is 04/07/23. Ed's wife called and she is listed on the DPR to speak to. Requesting refill on Adderall 15 mg called to:  [id number] [place], [place] - [address]   Phone: [phone]  Fax: [phone]    It is in stock per the wife.

## 2022-11-19 NOTE — Telephone Encounter (Signed)
LF 6/10, due 7/8

## 2022-11-22 ENCOUNTER — Other Ambulatory Visit: Payer: Self-pay

## 2022-11-22 DIAGNOSIS — F900 Attention-deficit hyperactivity disorder, predominantly inattentive type: Secondary | ICD-10-CM

## 2022-11-22 MED ORDER — AMPHETAMINE-DEXTROAMPHETAMINE 15 MG PO TABS: 15.0000 mg | ORAL_TABLET | Freq: Three times a day (TID) | ORAL | 0 refills | Status: DC

## 2022-11-22 NOTE — Telephone Encounter (Signed)
Pended.

## 2022-12-06 ENCOUNTER — Other Ambulatory Visit: Payer: Self-pay | Admitting: Nurse Practitioner

## 2022-12-06 DIAGNOSIS — I10 Essential (primary) hypertension: Secondary | ICD-10-CM

## 2022-12-08 ENCOUNTER — Encounter: Payer: Self-pay | Admitting: Nurse Practitioner

## 2022-12-10 ENCOUNTER — Encounter: Payer: Self-pay | Admitting: Family Medicine

## 2022-12-10 ENCOUNTER — Ambulatory Visit: Payer: BLUE CROSS/BLUE SHIELD | Admitting: Family Medicine

## 2022-12-10 VITALS — BP 140/100 | HR 83 | Temp 98.2°F | Ht 74.0 in | Wt 238.0 lb

## 2022-12-10 DIAGNOSIS — I10 Essential (primary) hypertension: Secondary | ICD-10-CM

## 2022-12-10 DIAGNOSIS — Z6830 Body mass index (BMI) 30.0-30.9, adult: Secondary | ICD-10-CM | POA: Diagnosis not present

## 2022-12-10 DIAGNOSIS — E6609 Other obesity due to excess calories: Secondary | ICD-10-CM | POA: Diagnosis not present

## 2022-12-10 NOTE — Progress Notes (Unsigned)
I,[name], CMA,acting as a Neurosurgeon for [employer], NP.,have documented all relevant documentation on the behalf of Steven Hollings, NP,as directed by  Vollie Aaron Moshe Salisbury, NP while in the presence of Oneal Biglow, NP.  Subjective:  Patient ID: Steven Gibson , male    DOB: 02-17-1971 , 52 y.o.   MRN: [id number]  Chief Complaint  Patient presents with   Hypertension    HPI  Patient presents today for hypertension management .Patient reports compliance with his meds. Patient is concerned that his Blood pressure is still elevated even though he is taking 3 of his blood pressure meds. Patient was accompanied by his significant other and after evaluation, realized he had not taken his medications before coming to the clinic today. Also patient takes 2 cans of Red Bulls daily, takes amphetamine for ADHD treatment and smokes 1/2 pkt of cigarettes daily . Patient was advised that these are factors that could elevate his blood pressure, he agreed to wean gradually to 1 can of Red bull.  Significant Other states that his blood pressure at home after taking his medication was in the 130s. They want labs checked today     Past Medical History:  Diagnosis Date   Arthritis    Arthrofibrosis of knee joint, left    post op TKA    History of Graves' disease 2008   s/p  RAI  same year , on medication for a year then stablized no meds since  (per pt followed by pcp)   Hyperlipidemia    Hypertension    followed by pcp   (in care everywhere pt had stress echo, 03-26-2016, negative for ishemia and echo normal)   Wears glasses      Family History  Problem Relation Age of Onset   Hypertension Mother    Hypertension Father    Hypertension Brother    Cancer Paternal Grandfather    Hypertension Brother      Current Outpatient Medications:    amLODipine (NORVASC) 10 MG tablet, TAKE 1 TABLET BY MOUTH EVERY DAY, Disp: 90 tablet, Rfl: 1   amphetamine-dextroamphetamine (ADDERALL) 15 MG tablet, Take  1 tablet by mouth 3 (three) times daily., Disp: 90 tablet, Rfl: 0   clobetasol cream (TEMOVATE) 0.05 %, Apply 1 Application topically 2 (two) times daily., Disp: 90 g, Rfl: 0   escitalopram (LEXAPRO) 20 MG tablet, Take 1 tablet (20 mg total) by mouth daily., Disp: 90 tablet, Rfl: 1   hydrALAZINE (APRESOLINE) 25 MG tablet, TAKE 1 TABLET TWICE DAILY, AM & NIGHT, Disp: 180 tablet, Rfl: 1   lisinopril-hydrochlorothiazide (ZESTORETIC) 20-25 MG tablet, TAKE 1 TABLET BY MOUTH EVERY DAY, Disp: 90 tablet, Rfl: 1   meloxicam (MOBIC) 15 MG tablet, TAKE 1 TABLET (15 MG TOTAL) BY MOUTH DAILY., Disp: 90 tablet, Rfl: 1   methimazole (TAPAZOLE) 5 MG tablet, Take 1 tablet (5 mg total) by mouth 2 (two) times daily., Disp: 180 tablet, Rfl: 2   Potassium Chloride (K+ POTASSIUM PO), Take by mouth. Over the counter, per pt takes for leg cramps, Disp: , Rfl:    pravastatin (PRAVACHOL) 20 MG tablet, Take 1 tablet (20 mg total) by mouth daily., Disp: 90 tablet, Rfl: 1   No Known Allergies   Review of Systems  Constitutional: Negative.   Eyes: Negative.   Cardiovascular: Negative.  Negative for chest pain, palpitations and leg swelling.  Musculoskeletal: Negative.   Skin: Negative.   Hematological: Negative.   Psychiatric/Behavioral: Negative.  Today's Vitals   12/10/22 0926  BP: (!) 140/100  Pulse: 83  Temp: 98.2 F (36.8 C)  Weight: 238 lb (108 kg)  Height: 6\' 2"  (1.88 m)  PainSc: 0-No pain   Body mass index is 30.56 kg/m.  Wt Readings from Last 3 Encounters:  12/10/22 238 lb (108 kg)  06/17/22 243 lb (110.2 kg)  06/15/22 251 lb (113.9 kg)     Objective:  Physical Exam Cardiovascular:     Rate and Rhythm: Normal rate and regular rhythm.     Pulses: Normal pulses.  Pulmonary:     Effort: Pulmonary effort is normal.     Breath sounds: Normal breath sounds.  Neurological:     Mental Status: He is alert and oriented to person, place, and time.  Psychiatric:        Mood and Affect: Mood  normal.         Assessment And Plan:  Essential hypertension -     CMP14+EGFR; Future -     CBC; Future -     Lipid panel; Future  Class 1 obesity due to excess calories with body mass index (BMI) of 30.0 to 30.9 in adult, unspecified whether serious comorbidity present     No follow-ups on file.  Patient was given opportunity to ask questions. Patient verbalized understanding of the plan and was able to repeat key elements of the plan. All questions were answered to their satisfaction.  Delores Edelstein Moshe Salisbury, NP  I, Denese Mentink Moshe Salisbury, NP, have reviewed all documentation for this visit. The documentation on 12/15/22 for the exam, diagnosis, procedures, and orders are all accurate and complete.   IF YOU HAVE BEEN REFERRED TO A SPECIALIST, IT MAY TAKE 1-2 WEEKS TO SCHEDULE/PROCESS THE REFERRAL. IF YOU HAVE NOT HEARD FROM US/SPECIALIST IN TWO WEEKS, PLEASE GIVE [place] A CALL AT [phone] X [phone].   THE PATIENT IS ENCOURAGED TO PRACTICE SOCIAL DISTANCING DUE TO THE COVID-19 PANDEMIC.

## 2022-12-17 ENCOUNTER — Telehealth: Payer: Self-pay | Admitting: Internal Medicine

## 2022-12-17 ENCOUNTER — Encounter: Payer: Self-pay | Admitting: Internal Medicine

## 2022-12-17 ENCOUNTER — Ambulatory Visit (INDEPENDENT_AMBULATORY_CARE_PROVIDER_SITE_OTHER): Payer: BLUE CROSS/BLUE SHIELD | Admitting: Internal Medicine

## 2022-12-17 VITALS — BP 138/82 | HR 84 | Ht 74.0 in | Wt 241.0 lb

## 2022-12-17 DIAGNOSIS — E0500 Thyrotoxicosis with diffuse goiter without thyrotoxic crisis or storm: Secondary | ICD-10-CM | POA: Diagnosis not present

## 2022-12-17 DIAGNOSIS — Z8639 Personal history of other endocrine, nutritional and metabolic disease: Secondary | ICD-10-CM | POA: Diagnosis not present

## 2022-12-17 DIAGNOSIS — E0590 Thyrotoxicosis, unspecified without thyrotoxic crisis or storm: Secondary | ICD-10-CM | POA: Diagnosis not present

## 2022-12-17 LAB — T4, FREE: Free T4: 0.76 ng/dL (ref 0.60–1.60)

## 2022-12-17 LAB — TSH: TSH: 4.46 u[IU]/mL (ref 0.35–5.50)

## 2022-12-17 MED ORDER — METHIMAZOLE 5 MG PO TABS
5.0000 mg | ORAL_TABLET | Freq: Every day | ORAL | 3 refills | Status: DC
Start: 2022-12-17 — End: 2023-06-21

## 2022-12-17 NOTE — Progress Notes (Signed)
Name: Steven Gibson  MRN/ DOB: [id number], 11/13/1970    Age/ Sex: 52 y.o., male    PCP: [name], FNP   Reason for Endocrinology Evaluation: Graves' Disease     Date of Initial Endocrinology Evaluation: 06/17/2022    HPI: [name] is a 52 y.o. male with a past medical history of HTN and dylipidemia . The patient presented for initial endocrinology clinic visit on 06/17/2022 for consultative assistance with his Graves' Disease.    He was diagnosed with graves' disease > 20 yrs ago, he was initially on Methimazole but  subsequently was treated with RAI ablation With normalization of TFT until he presented with hyperthyroidism in12/2023 with a suppressed TSH <0.005 uIU/mL with elevated total T4 13.3ug/dL   Pt was started on Methimazole 04/2022   Fater with Grave;s disease and 2 daughter    On his initial visit with me he was on methimazole 3 times daily  TRAb negative  SUBJECTIVE:    Today (12/17/22):  [name] is here for a follow up on hyperthyroidism.   Patient has been noted with weight loss Denies local neck swelling   Denies tremors  No biotin  Has left eye orbitopathy - with dryness - last exam 05/2022    Methimazole 5 mg twice daily  HISTORY:  Past Medical History:  Past Medical History:  Diagnosis Date   Arthritis    Arthrofibrosis of knee joint, left    post op TKA    History of Graves' disease 2008   s/p  RAI  same year , on medication for a year then stablized no meds since  (per pt followed by pcp)   Hyperlipidemia    Hypertension    followed by pcp   (in care everywhere pt had stress echo, 03-26-2016, negative for ishemia and echo normal)   Wears glasses    Past Surgical History:  Past Surgical History:  Procedure Laterality Date   ANTERIOR CRUCIATE LIGAMENT REPAIR Left 1990S   KNEE ARTHROSCOPY W/ MEDIAL COLLATERAL LIGAMENT (MCL) REPAIR Left 1990s   KNEE ARTHROSCOPY W/ MENISCAL REPAIR Left 1990s   KNEE CLOSED  REDUCTION Left 10/21/2020   Procedure: CLOSED MANIPULATION LEFT KNEE;  Surgeon: Teryl Lucy, MD;  [facility];  Service: Orthopedics;  Laterality: Left;   LIPOMA EXCISION     LEF SHOULDER AREA   TOTAL KNEE ARTHROPLASTY Left 05/06/2020   Procedure: TOTAL KNEE ARTHROPLASTY;  Surgeon: Teryl Lucy, MD;  Location: [facility];  Service: Orthopedics;  Laterality: Left;   UMBILICAL HERNIA REPAIR  07-17-2015  [email or web address]    AND RIGHT ABDOMINAL WALL EXCISION LIPOMA    Social History:  reports that he has been smoking cigarettes. He has a 12.5 pack-year smoking history. He quit smokeless tobacco use about 34 years ago. He reports current alcohol use. He reports that he does not use drugs. Family History: family history includes Cancer in his paternal grandfather; Hypertension in his brother, brother, father, and mother.   HOME MEDICATIONS: Allergies as of 12/17/2022   No Known Allergies      Medication List        Accurate as of December 17, 2022 10:44 AM. If you have any questions, ask your nurse or doctor.          amLODipine 10 MG tablet Commonly known as: NORVASC TAKE 1 TABLET BY MOUTH EVERY DAY   amphetamine-dextroamphetamine 15 MG tablet Commonly known as: Adderall Take 1 tablet by mouth 3 (three)  times daily.   clobetasol cream 0.05 % Commonly known as: TEMOVATE Apply 1 Application topically 2 (two) times daily.   escitalopram 20 MG tablet Commonly known as: LEXAPRO Take 1 tablet (20 mg total) by mouth daily.   hydrALAZINE 25 MG tablet Commonly known as: APRESOLINE TAKE 1 TABLET TWICE DAILY, AM & NIGHT   K+ POTASSIUM PO Take by mouth. Over the counter, per pt takes for leg cramps   lisinopril-hydrochlorothiazide 20-25 MG tablet Commonly known as: ZESTORETIC TAKE 1 TABLET BY MOUTH EVERY DAY   meloxicam 15 MG tablet Commonly known as: MOBIC TAKE 1 TABLET (15 MG TOTAL) BY MOUTH DAILY.   methimazole 5 MG tablet Commonly known as: TAPAZOLE Take  1 tablet (5 mg total) by mouth 2 (two) times daily.   pravastatin 20 MG tablet Commonly known as: PRAVACHOL Take 1 tablet (20 mg total) by mouth daily.          REVIEW OF SYSTEMS: A comprehensive ROS was conducted with the patient and is negative except as per HPI and     OBJECTIVE:  VS: BP 138/82 (BP Location: Left Arm, Patient Position: Sitting, Cuff Size: Large)   Pulse 84   Ht 6\' 2"  (1.88 m)   Wt 241 lb (109.3 kg)   SpO2 98%   BMI 30.94 kg/m    Wt Readings from Last 3 Encounters:  12/17/22 241 lb (109.3 kg)  12/10/22 238 lb (108 kg)  06/17/22 243 lb (110.2 kg)     EXAM: General: Pt appears well and is in NAD  Eyes: External eye exam with a stare, left eye  exophthalmos with chemosis .EOM intact.    Neck: General: Supple without adenopathy. Thyroid: Thyroid size normal.  No goiter or nodules appreciated.   Lungs: Clear with good BS bilat   Heart: Auscultation: RRR.  Abdomen: soft, nontender  Extremities:  BL LE: No pretibial edema  Mental Status: Judgment, insight: Intact Orientation: Oriented to time, place, and person Mood and affect: No depression, anxiety, or agitation     DATA REVIEWED:  Latest Reference Range & Units 12/17/22 10:46  TSH 0.35 - 5.50 uIU/mL 4.46  T4,Free(Direct) 0.60 - 1.60 ng/dL 0.34      Latest Reference Range & Units 06/17/22 12:21  Sodium 135 - 145 mEq/L 138  Potassium 3.5 - 5.1 mEq/L 3.4 (L)  Chloride 96 - 112 mEq/L 101  CO2 19 - 32 mEq/L 29  Glucose 70 - 99 mg/dL 742 (H)  BUN 6 - 23 mg/dL 11  Creatinine 5.95 - 6.38 mg/dL 7.56  Calcium 8.4 - 43.3 mg/dL 9.8  Alkaline Phosphatase 39 - 117 U/L 135 (H)  Albumin 3.5 - 5.2 g/dL 4.8  AST 0 - 37 U/L 35  ALT 0 - 53 U/L 36  Total Protein 6.0 - 8.3 g/dL 7.9  Total Bilirubin 0.2 - 1.2 mg/dL 0.4  GFR >29.51 mL/min 74.11    Latest Reference Range & Units 06/17/22 12:21  WBC 4.0 - 10.5 K/uL 5.1  RBC 4.22 - 5.81 Mil/uL 5.32  Hemoglobin 13.0 - 17.0 g/dL 88.4  HCT 16.6 - 06.3 %  45.5  MCV 78.0 - 100.0 fl 85.6  MCHC 30.0 - 36.0 g/dL 01.6  RDW 01.0 - 93.2 % 15.0  Platelets 150.0 - 400.0 K/uL 308.0  Neutrophils 43.0 - 77.0 % 51.9  Lymphocytes 12.0 - 46.0 % 35.2  Monocytes Relative 3.0 - 12.0 % 9.7  Eosinophil 0.0 - 5.0 % 1.9  Basophil 0.0 - 3.0 % 1.3  NEUT#  1.4 - 7.7 K/uL 2.6  Lymphocyte # 0.7 - 4.0 K/uL 1.8  Monocyte # 0.1 - 1.0 K/uL 0.5  Eosinophils Absolute 0.0 - 0.7 K/uL 0.1  Basophils Absolute 0.0 - 0.1 K/uL 0.1  Glucose 70 - 99 mg/dL 355 (H)  TSH 7.32 - 2.02 uIU/mL 11.77 (H)  T4,Free(Direct) 0.60 - 1.60 ng/dL 5.42     Latest Reference Range & Units 06/17/22 12:21  TRAB <=2.00 IU/L <1.00     ASSESSMENT/PLAN/RECOMMENDATIONS:   Hyperthyroidism:  -This is due to Graves' disease -He is s/p RAI over 2 decades ago with normalization of TFTs until 04/2022 when he presented with recurrence -Patient is clinically euthyroid -We did discuss alternative options to include continuation of thionamides therapy, versus another RAI versus total thyroidectomy.  Given left orbitopathy I would  NOT recommend proceeding with RAI, so his only option is  surgical therapy v versus continue methimazole -TFTs are normal today, but there is room to decrease the dose as below   Medications : Decrease methimazole 5 mg , 1 tablet  daily  2.  Graves' disease:  -Patient with Graves' orbitopathy, a referral has been placed to ophthalmology    F/U in 6 months  Signed electronically by: Lyndle Herrlich, MD  [facility] Endocrinology  [facility] [address] [place] [place], [place] [address] Phone: [phone] FAX: [phone]   CC: [name], FNP [address] [place] [address] Phone: [phone] Fax: [phone]   Return to Endocrinology clinic as below: Future Appointments  Date Time Provider Department Center  12/17/2022 10:50 AM Anjela Cassara, Konrad Dolores, MD [facility] None  12/27/2022 10:00 AM [name], FNP [facility] None  04/07/2023  8:00 AM Joan Flores, NP [facility] None  06/20/2023  9:30 AM Kailash Hinze, Konrad Dolores, MD [facility] None

## 2022-12-17 NOTE — Telephone Encounter (Signed)
Please let the patient know that his thyroid levels remain within normal range, but we do have room to decrease the dose    Please asked the patient to take methimazole 5 mg ONCE Daily    Thanks

## 2022-12-18 ENCOUNTER — Other Ambulatory Visit: Payer: Self-pay | Admitting: Nurse Practitioner

## 2022-12-20 NOTE — Telephone Encounter (Signed)
Patient aware of results.

## 2022-12-24 ENCOUNTER — Telehealth: Payer: Self-pay | Admitting: Behavioral Health

## 2022-12-24 ENCOUNTER — Other Ambulatory Visit: Payer: Self-pay

## 2022-12-24 DIAGNOSIS — F900 Attention-deficit hyperactivity disorder, predominantly inattentive type: Secondary | ICD-10-CM

## 2022-12-24 MED ORDER — AMPHETAMINE-DEXTROAMPHETAMINE 15 MG PO TABS
15.0000 mg | ORAL_TABLET | Freq: Three times a day (TID) | ORAL | 0 refills | Status: DC
Start: 2022-12-24 — End: 2023-01-24

## 2022-12-24 NOTE — Telephone Encounter (Signed)
Next visit is 04/10/23. Shahmeer's wife called. She is listed on the DPR to speak to. Requesting refill on Adderall 15 mg called to:  [id number] [place], [place] - [address]   Phone: [phone]  Fax: [phone]    It is in stock at this location.

## 2022-12-24 NOTE — Telephone Encounter (Signed)
Pended.

## 2022-12-27 ENCOUNTER — Encounter: Payer: Managed Care, Other (non HMO) | Admitting: Nurse Practitioner

## 2023-01-24 ENCOUNTER — Telehealth: Payer: Self-pay | Admitting: Behavioral Health

## 2023-01-24 ENCOUNTER — Other Ambulatory Visit: Payer: Self-pay

## 2023-01-24 DIAGNOSIS — F900 Attention-deficit hyperactivity disorder, predominantly inattentive type: Secondary | ICD-10-CM

## 2023-01-24 MED ORDER — AMPHETAMINE-DEXTROAMPHETAMINE 15 MG PO TABS
15.0000 mg | ORAL_TABLET | Freq: Three times a day (TID) | ORAL | 0 refills | Status: DC
Start: 2023-01-24 — End: 2023-02-22

## 2023-01-24 NOTE — Telephone Encounter (Signed)
Patient's wife lvm at 4:51 to cancel sending Adderall to [facility]. [facility] pharmacy [facility]

## 2023-01-24 NOTE — Telephone Encounter (Signed)
Steven Gibson called at 10:52 to request refill of his Adderall 15mg . Appt 11/21.  [facility] to [id number] [place], [place] - [address]

## 2023-01-24 NOTE — Telephone Encounter (Signed)
Patient's wife called in stating the usual pharmacy is out of Adderall 15mg . They have found another pharmacy that has it in stock and would like it sent there instead. Ph: [phone] Appt 11/21 Pharmacy [facility] [address] [place]

## 2023-01-24 NOTE — Telephone Encounter (Signed)
Pended.

## 2023-02-22 ENCOUNTER — Other Ambulatory Visit: Payer: Self-pay

## 2023-02-22 ENCOUNTER — Telehealth: Payer: Self-pay | Admitting: Behavioral Health

## 2023-02-22 DIAGNOSIS — F900 Attention-deficit hyperactivity disorder, predominantly inattentive type: Secondary | ICD-10-CM

## 2023-02-22 MED ORDER — AMPHETAMINE-DEXTROAMPHETAMINE 15 MG PO TABS
15.0000 mg | ORAL_TABLET | Freq: Three times a day (TID) | ORAL | 0 refills | Status: DC
Start: 2023-02-22 — End: 2023-05-19

## 2023-02-22 MED ORDER — AMPHETAMINE-DEXTROAMPHETAMINE 15 MG PO TABS
15.0000 mg | ORAL_TABLET | Freq: Three times a day (TID) | ORAL | 0 refills | Status: DC
Start: 2023-03-22 — End: 2023-04-07

## 2023-02-22 NOTE — Telephone Encounter (Signed)
Patient called in for refill on Adderall 15mg . Ph: [phone] Appt 11/21 Pharmacy [facility] [address] [place], [place]

## 2023-02-22 NOTE — Telephone Encounter (Signed)
Pended.

## 2023-03-12 ENCOUNTER — Other Ambulatory Visit: Payer: Self-pay | Admitting: Internal Medicine

## 2023-03-12 DIAGNOSIS — Z8639 Personal history of other endocrine, nutritional and metabolic disease: Secondary | ICD-10-CM

## 2023-03-16 ENCOUNTER — Encounter: Payer: Managed Care, Other (non HMO) | Admitting: Nurse Practitioner

## 2023-03-16 NOTE — Progress Notes (Deleted)
Madelaine Bhat, CMA,acting as a Neurosurgeon for [name], FNP.,have documented all relevant documentation on the behalf of [name], FNP,as directed by  [name], FNP while in the presence of [name], FNP.  Subjective:   Patient ID: Steven Gibson , male    DOB: 04/26/1971 , 52 y.o.   MRN: [id number]  No chief complaint on file.   HPI  Patient presents today for HM, Patient reports compliance with medication. Patient denies any chest pain, SOB, or headaches. Patient has no concerns today.     Past Medical History:  Diagnosis Date  . Arthritis   . Arthrofibrosis of knee joint, left    post op TKA   . History of Graves' disease 2008   s/p  RAI  same year , on medication for a year then stablized no meds since  (per pt followed by pcp)  . Hyperlipidemia   . Hypertension    followed by pcp   (in care everywhere pt had stress echo, 03-26-2016, negative for ishemia and echo normal)  . Wears glasses      Family History  Problem Relation Age of Onset  . Hypertension Mother   . Hypertension Father   . Hypertension Brother   . Cancer Paternal Grandfather   . Hypertension Brother      Current Outpatient Medications:  .  amLODipine (NORVASC) 10 MG tablet, TAKE 1 TABLET BY MOUTH EVERY DAY, Disp: 90 tablet, Rfl: 1 .  amphetamine-dextroamphetamine (ADDERALL) 15 MG tablet, Take 1 tablet by mouth 3 (three) times daily., Disp: 90 tablet, Rfl: 0 .  [START ON 03/22/2023] amphetamine-dextroamphetamine (ADDERALL) 15 MG tablet, Take 1 tablet by mouth 3 (three) times daily., Disp: 90 tablet, Rfl: 0 .  clobetasol cream (TEMOVATE) 0.05 %, Apply 1 Application topically 2 (two) times daily., Disp: 90 g, Rfl: 0 .  escitalopram (LEXAPRO) 20 MG tablet, TAKE 1 TABLET BY MOUTH EVERY DAY, Disp: 90 tablet, Rfl: 1 .  hydrALAZINE (APRESOLINE) 25 MG tablet, TAKE 1 TABLET TWICE DAILY, AM & NIGHT, Disp: 180 tablet, Rfl: 1 .  lisinopril-hydrochlorothiazide (ZESTORETIC) 20-25 MG tablet, TAKE 1  TABLET BY MOUTH EVERY DAY, Disp: 90 tablet, Rfl: 1 .  meloxicam (MOBIC) 15 MG tablet, TAKE 1 TABLET (15 MG TOTAL) BY MOUTH DAILY., Disp: 90 tablet, Rfl: 1 .  methimazole (TAPAZOLE) 5 MG tablet, Take 1 tablet (5 mg total) by mouth daily., Disp: 90 tablet, Rfl: 3 .  Potassium Chloride (K+ POTASSIUM PO), Take by mouth. Over the counter, per pt takes for leg cramps, Disp: , Rfl:  .  pravastatin (PRAVACHOL) 20 MG tablet, Take 1 tablet (20 mg total) by mouth daily., Disp: 90 tablet, Rfl: 1   No Known Allergies   Men's preventive visit. Patient Health Questionnaire (PHQ-2) is  Flowsheet Row Office Visit from 05/03/2022 in [name]  PHQ-2 Total Score 0     . Patient is on a *** diet. Marital status: Married. Relevant history for alcohol use is:  Social History   Substance and Sexual Activity  Alcohol Use Yes   Comment: occasional  . Relevant history for tobacco use is:  Social History   Tobacco Use  Smoking Status Some Days  . Current packs/day: 0.50  . Average packs/day: 0.5 packs/day for 25.0 years (12.5 ttl pk-yrs)  . Types: Cigarettes  Smokeless Tobacco Former  . Quit date: 10/20/1988  .   Review of Systems   There were no vitals filed for this visit.  There is no height or weight on file to calculate BMI.  Wt Readings from Last 3 Encounters:  12/17/22 241 lb (109.3 kg)  12/10/22 238 lb (108 kg)  06/17/22 243 lb (110.2 kg)    Objective:  Physical Exam      Assessment And Plan:    Encounter for annual health examination  Essential hypertension  Prediabetes  Mixed hyperlipidemia     No follow-ups on file. Patient was given opportunity to ask questions. Patient verbalized understanding of the plan and was able to repeat key elements of the plan. All questions were answered to their satisfaction.   [name], FNP  I, [name], FNP, have reviewed all documentation for this visit. The documentation on 03/16/23 for the  exam, diagnosis, procedures, and orders are all accurate and complete.

## 2023-03-21 ENCOUNTER — Ambulatory Visit: Payer: BLUE CROSS/BLUE SHIELD | Admitting: Nurse Practitioner

## 2023-03-24 ENCOUNTER — Encounter: Payer: Managed Care, Other (non HMO) | Admitting: Nurse Practitioner

## 2023-04-06 NOTE — Progress Notes (Signed)
Madelaine Bhat, CMA,acting as a Neurosurgeon for Steven Felts, FNP.,have documented all relevant documentation on the behalf of Steven Felts, FNP,as directed by  Steven Felts, FNP while in the presence of Steven Felts, FNP.  Subjective:   Patient ID: Steven Gibson , male    DOB: Aug 09, 1970 , 52 y.o.   MRN: [id number]  Chief Complaint  Patient presents with   Annual Exam    HPI  Patient presents today for HM, Patient reports compliance with medication. Patient denies any chest pain, SOB, or headaches. Patient has no concerns today. He is now going to [facility] for his ADHD. Reports he has not experiemented with cocaine or any drugs since his 20's.      Past Medical History:  Diagnosis Date   Arthritis    Arthrofibrosis of knee joint, left    post op TKA    History of Graves' disease 2008   s/p  RAI  same year , on medication for a year then stablized no meds since  (per pt followed by pcp)   Hyperlipidemia    Hypertension    followed by pcp   (in care everywhere pt had stress echo, 03-26-2016, negative for ishemia and echo normal)   Wears glasses      Family History  Problem Relation Age of Onset   Hypertension Mother    Hypertension Father    Hypertension Brother    Cancer Paternal Grandfather    Hypertension Brother      Current Outpatient Medications:    [START ON 04/20/2023] amphetamine-dextroamphetamine (ADDERALL) 15 MG tablet, Take 1 tablet by mouth 3 (three) times daily., Disp: 90 tablet, Rfl: 0   clobetasol cream (TEMOVATE) 0.05 %, Apply 1 Application topically 2 (two) times daily., Disp: 90 g, Rfl: 0   escitalopram (LEXAPRO) 20 MG tablet, TAKE 1 TABLET BY MOUTH EVERY DAY, Disp: 90 tablet, Rfl: 1   methimazole (TAPAZOLE) 5 MG tablet, Take 1 tablet (5 mg total) by mouth daily., Disp: 90 tablet, Rfl: 3   Potassium Chloride (K+ POTASSIUM PO), Take by mouth. Over the counter, per pt takes for leg cramps, Disp: , Rfl:    amLODipine (NORVASC) 10 MG tablet, Take 1  tablet (10 mg total) by mouth daily., Disp: 90 tablet, Rfl: 1   amphetamine-dextroamphetamine (ADDERALL) 15 MG tablet, Take 1 tablet by mouth 3 (three) times daily., Disp: 90 tablet, Rfl: 0   hydrALAZINE (APRESOLINE) 25 MG tablet, TAKE 1 TABLET TWICE DAILY, AM & NIGHT, Disp: 180 tablet, Rfl: 1   lisinopril-hydrochlorothiazide (ZESTORETIC) 20-25 MG tablet, Take 1 tablet by mouth daily., Disp: 90 tablet, Rfl: 1   meloxicam (MOBIC) 15 MG tablet, Take 1 tablet (15 mg total) by mouth daily., Disp: 90 tablet, Rfl: 1   pravastatin (PRAVACHOL) 20 MG tablet, Take 1 tablet (20 mg total) by mouth daily., Disp: 90 tablet, Rfl: 1   No Known Allergies   Men's preventive visit. Patient Health Questionnaire (PHQ-2) is  Flowsheet Row Office Visit from 04/07/2023 in [name]  PHQ-2 Total Score 0     Patient is on a regular diet; eats late he is working 3rd shift. He is working at [employer]. Exercise- walk 10,000 steps a day, he has been roller skating once a week. Marital status: Married. Relevant history for alcohol use is:  Social History   Substance and Sexual Activity  Alcohol Use Yes   Comment: occasional   Relevant history for tobacco use is:  Social  History   Tobacco Use  Smoking Status Some Days   Current packs/day: 0.50   Average packs/day: 0.5 packs/day for 25.0 years (12.5 ttl pk-yrs)   Types: Cigarettes  Smokeless Tobacco Former   Quit date: 10/20/1988  .   Review of Systems  Constitutional: Negative.   HENT: Negative.    Eyes: Negative.   Respiratory: Negative.    Cardiovascular: Negative.   Gastrointestinal: Negative.   Endocrine: Negative.   Genitourinary: Negative.   Musculoskeletal: Negative.   Skin: Negative.   Allergic/Immunologic: Negative.   Hematological: Negative.   Psychiatric/Behavioral: Negative.       Today's Vitals   04/07/23 1008  BP: 120/80  Pulse: 98  Temp: 97.8 F (36.6 C)  TempSrc: Oral  Weight: 247 lb  12.8 oz (112.4 kg)  Height: 6\' 2"  (1.88 m)  PainSc: 0-No pain   Body mass index is 31.82 kg/m.  Wt Readings from Last 3 Encounters:  04/07/23 247 lb 12.8 oz (112.4 kg)  12/17/22 241 lb (109.3 kg)  12/10/22 238 lb (108 kg)    Objective:  Physical Exam Vitals reviewed.  Constitutional:      General: He is not in acute distress.    Appearance: Normal appearance. He is obese.  HENT:     Head: Normocephalic.     Right Ear: External ear normal. There is impacted cerumen.     Left Ear: Tympanic membrane, ear canal and external ear normal. There is no impacted cerumen.     Nose: Nose normal. No congestion.     Mouth/Throat:     Mouth: Mucous membranes are moist.  Eyes:     Extraocular Movements: Extraocular movements intact.     Conjunctiva/sclera: Conjunctivae normal.     Pupils: Pupils are equal, round, and reactive to light.  Cardiovascular:     Rate and Rhythm: Normal rate and regular rhythm.     Pulses: Normal pulses.     Heart sounds: Normal heart sounds. No murmur heard. Pulmonary:     Effort: Pulmonary effort is normal. No respiratory distress.     Breath sounds: Normal breath sounds. No wheezing.  Abdominal:     General: Abdomen is flat. Bowel sounds are normal. There is no distension.     Palpations: Abdomen is soft.     Tenderness: There is no abdominal tenderness.  Genitourinary:    Comments: Deferred at patient request Musculoskeletal:        General: No swelling or tenderness. Normal range of motion.     Cervical back: Normal range of motion and neck supple. No rigidity.  Skin:    General: Skin is warm and dry.     Capillary Refill: Capillary refill takes less than 2 seconds.     Findings: Rash (left shin with dry scaly area appears worse, irregular shape) present.  Neurological:     General: No focal deficit present.     Mental Status: He is alert and oriented to person, place, and time.     Cranial Nerves: No cranial nerve deficit.     Sensory: No sensory  deficit.     Motor: No weakness.  Psychiatric:        Mood and Affect: Mood normal.        Behavior: Behavior normal.        Thought Content: Thought content normal.        Judgment: Judgment normal.         Assessment And Plan:    Encounter for annual health  examination Assessment & Plan: Behavior modifications discussed and diet history reviewed.   Pt will continue to exercise regularly and modify diet with low GI, plant based foods and decrease intake of processed foods.  Recommend intake of daily multivitamin, Vitamin D, and calcium.  Recommend colonoscopy for preventive screenings, as well as recommend immunizations that include influenza, TDAP, and Shingles    Encounter for prostate cancer screening -     PSA  COVID-19 vaccination declined Assessment & Plan: Declines covid 19 vaccine. Discussed risk of covid 19 and if he changes her mind about the vaccine to call the office. Education has been provided regarding the importance of this vaccine but patient still declined. Advised may receive this vaccine at local pharmacy or Health Dept.or vaccine clinic. Aware to provide a copy of the vaccination record if obtained from local pharmacy or Health Dept.  Encouraged to take multivitamin, vitamin d, vitamin c and zinc to increase immune system. Aware can call office if would like to have vaccine here at office. Verbalized acceptance and understanding.    Influenza vaccination declined Assessment & Plan: Patient declined influenza vaccination at this time. Patient is aware that influenza vaccine prevents illness in 70% of healthy people, and reduces hospitalizations to 30-70% in elderly. This vaccine is recommended annually. Education has been provided regarding the importance of this vaccine but patient still declined. Advised may receive this vaccine at local pharmacy or Health Dept.or vaccine clinic. Aware to provide a copy of the vaccination record if obtained from local pharmacy  or Health Dept.  Pt is willing to accept risk associated with refusing vaccination.    Herpes zoster vaccination declined Assessment & Plan: Declines shingrix, educated on disease process and is aware if he changes his mind to notify office    Tobacco abuse Assessment & Plan: Smoking cessation instruction/counseling given:  counseled patient on the dangers of tobacco use, advised patient to stop smoking, and reviewed strategies to maximize success    Essential hypertension Assessment & Plan: Blood pressure is better can.  Continue current medications.  Orders: -     EKG 12-Lead -     POCT URINALYSIS DIP (CLINITEK) -     Microalbumin / creatinine urine ratio -     CMP14+EGFR -     amLODIPine Besylate; Take 1 tablet (10 mg total) by mouth daily.  Dispense: 90 tablet; Refill: 1 -     hydrALAZINE HCl; TAKE 1 TABLET TWICE DAILY, AM & NIGHT  Dispense: 180 tablet; Refill: 1 -     Lisinopril-hydroCHLOROthiazide; Take 1 tablet by mouth daily.  Dispense: 90 tablet; Refill: 1  Mixed hyperlipidemia Assessment & Plan: Continue taking statin, tolerating well encouraged to be adherent to taking daily.  Orders: -     Lipid panel -     Pravastatin Sodium; Take 1 tablet (20 mg total) by mouth daily.  Dispense: 90 tablet; Refill: 1  Prediabetes Assessment & Plan: HgbA1c is stable, continue focusing on healthy diet   Orders: -     Hemoglobin A1c  H/O Graves' disease Assessment & Plan: Continue follow-up with endocrinology   Impacted cerumen of right ear Assessment & Plan: Wax is removed by with lavage with elephant ear with 1/2 water and 1/2 peroxide. Instructions for home care to prevent wax buildup are given.   Orders: -     Ear Lavage  Other long term (current) drug therapy -     CBC with Differential/Platelet  Attention deficit hyperactivity disorder (ADHD), combined type Assessment &  Plan: Continue f/u with Behavioral Health   Rash and nonspecific skin  eruption Assessment & Plan: Scaly rash to left lower extremity, will refer to Dermatology   Other orders -     Meloxicam; Take 1 tablet (15 mg total) by mouth daily.  Dispense: 90 tablet; Refill: 1   Return for 1 year physical, 6 month bp check.  Patient was given opportunity to ask questions. Patient verbalized understanding of the plan and was able to repeat key elements of the plan. All questions were answered to their satisfaction.   Steven Felts, FNP  I, Steven Felts, FNP, have reviewed all documentation for this visit. The documentation on 04/13/23 for the exam, diagnosis, procedures, and orders are all accurate and complete.

## 2023-04-07 ENCOUNTER — Encounter: Payer: Self-pay | Admitting: Nurse Practitioner

## 2023-04-07 ENCOUNTER — Ambulatory Visit (INDEPENDENT_AMBULATORY_CARE_PROVIDER_SITE_OTHER): Payer: BLUE CROSS/BLUE SHIELD | Admitting: Nurse Practitioner

## 2023-04-07 ENCOUNTER — Encounter: Payer: Self-pay | Admitting: Behavioral Health

## 2023-04-07 ENCOUNTER — Ambulatory Visit (INDEPENDENT_AMBULATORY_CARE_PROVIDER_SITE_OTHER): Payer: BLUE CROSS/BLUE SHIELD | Admitting: Behavioral Health

## 2023-04-07 VITALS — BP 120/80 | HR 98 | Temp 97.8°F | Ht 74.0 in | Wt 247.8 lb

## 2023-04-07 DIAGNOSIS — E782 Mixed hyperlipidemia: Secondary | ICD-10-CM

## 2023-04-07 DIAGNOSIS — R21 Rash and other nonspecific skin eruption: Secondary | ICD-10-CM

## 2023-04-07 DIAGNOSIS — Z8639 Personal history of other endocrine, nutritional and metabolic disease: Secondary | ICD-10-CM

## 2023-04-07 DIAGNOSIS — Z720 Tobacco use: Secondary | ICD-10-CM | POA: Diagnosis not present

## 2023-04-07 DIAGNOSIS — F411 Generalized anxiety disorder: Secondary | ICD-10-CM | POA: Diagnosis not present

## 2023-04-07 DIAGNOSIS — Z79899 Other long term (current) drug therapy: Secondary | ICD-10-CM

## 2023-04-07 DIAGNOSIS — F900 Attention-deficit hyperactivity disorder, predominantly inattentive type: Secondary | ICD-10-CM | POA: Diagnosis not present

## 2023-04-07 DIAGNOSIS — Z2821 Immunization not carried out because of patient refusal: Secondary | ICD-10-CM | POA: Diagnosis not present

## 2023-04-07 DIAGNOSIS — F902 Attention-deficit hyperactivity disorder, combined type: Secondary | ICD-10-CM | POA: Diagnosis not present

## 2023-04-07 DIAGNOSIS — F3341 Major depressive disorder, recurrent, in partial remission: Secondary | ICD-10-CM

## 2023-04-07 DIAGNOSIS — Z125 Encounter for screening for malignant neoplasm of prostate: Secondary | ICD-10-CM | POA: Diagnosis not present

## 2023-04-07 DIAGNOSIS — Z0000 Encounter for general adult medical examination without abnormal findings: Secondary | ICD-10-CM

## 2023-04-07 DIAGNOSIS — R7303 Prediabetes: Secondary | ICD-10-CM | POA: Diagnosis not present

## 2023-04-07 DIAGNOSIS — H6121 Impacted cerumen, right ear: Secondary | ICD-10-CM

## 2023-04-07 DIAGNOSIS — I10 Essential (primary) hypertension: Secondary | ICD-10-CM

## 2023-04-07 MED ORDER — LISINOPRIL-HYDROCHLOROTHIAZIDE 20-25 MG PO TABS: 1.0000 | ORAL_TABLET | Freq: Every day | ORAL | 1 refills | Status: DC

## 2023-04-07 MED ORDER — AMPHETAMINE-DEXTROAMPHETAMINE 15 MG PO TABS: 15.0000 mg | ORAL_TABLET | Freq: Three times a day (TID) | ORAL | 0 refills | Status: DC

## 2023-04-07 MED ORDER — PRAVASTATIN SODIUM 20 MG PO TABS: 20.0000 mg | ORAL_TABLET | Freq: Every day | ORAL | 1 refills | Status: DC

## 2023-04-07 MED ORDER — AMLODIPINE BESYLATE 10 MG PO TABS: 10.0000 mg | ORAL_TABLET | Freq: Every day | ORAL | 1 refills | Status: DC

## 2023-04-07 MED ORDER — MELOXICAM 15 MG PO TABS: 15.0000 mg | ORAL_TABLET | Freq: Every day | ORAL | 1 refills | Status: DC

## 2023-04-07 MED ORDER — HYDRALAZINE HCL 25 MG PO TABS: ORAL_TABLET | ORAL | 1 refills | Status: DC

## 2023-04-07 NOTE — Assessment & Plan Note (Signed)
Continue follow up with endocrinology.

## 2023-04-07 NOTE — Progress Notes (Signed)
[facility]  Patient ID: Floyed Torgerson,  MRN: [id number]  PCP: [name], FNP  Date of Evaluation: 04/07/2023 Time spent:30 minutes  Chief Complaint:  Chief Complaint   Anxiety; Depression; Follow-up; Patient Education; Medication Refill     HISTORY/CURRENT STATUS: HPI Andrue", 52 year old African-American male presents to this office for follow up and medication management. No social changes this visit. He is very happy with his care. Says that his medications are working fine for now and that he would only need refills.  He says he is currently stable and not requesting any changes to his medication regimen. He is currently working new job at [employer]. He reports anxiety at 2/10, and depression at 2/10.    He is sleeping 7 to 8 hours per night.  He denies any history of mania or psychosis.  No history of auditory or visual hallucinations.  No SI or HI.   Past psychiatric medication trials: Wellbutrin Vyvanse        Individual Medical History/ Review of Systems: Changes? :No   Allergies: Patient has no known allergies.  Current Medications:  Current Outpatient Medications:    amLODipine (NORVASC) 10 MG tablet, TAKE 1 TABLET BY MOUTH EVERY DAY, Disp: 90 tablet, Rfl: 1   amphetamine-dextroamphetamine (ADDERALL) 15 MG tablet, Take 1 tablet by mouth 3 (three) times daily., Disp: 90 tablet, Rfl: 0   [START ON 04/20/2023] amphetamine-dextroamphetamine (ADDERALL) 15 MG tablet, Take 1 tablet by mouth 3 (three) times daily., Disp: 90 tablet, Rfl: 0   clobetasol cream (TEMOVATE) 0.05 %, Apply 1 Application topically 2 (two) times daily., Disp: 90 g, Rfl: 0   escitalopram (LEXAPRO) 20 MG tablet, TAKE 1 TABLET BY MOUTH EVERY DAY, Disp: 90 tablet, Rfl: 1   hydrALAZINE (APRESOLINE) 25 MG tablet, TAKE 1 TABLET TWICE DAILY, AM & NIGHT, Disp: 180 tablet, Rfl: 1   lisinopril-hydrochlorothiazide (ZESTORETIC) 20-25 MG tablet, TAKE 1 TABLET BY MOUTH EVERY DAY,  Disp: 90 tablet, Rfl: 1   meloxicam (MOBIC) 15 MG tablet, TAKE 1 TABLET (15 MG TOTAL) BY MOUTH DAILY., Disp: 90 tablet, Rfl: 1   methimazole (TAPAZOLE) 5 MG tablet, Take 1 tablet (5 mg total) by mouth daily., Disp: 90 tablet, Rfl: 3   Potassium Chloride (K+ POTASSIUM PO), Take by mouth. Over the counter, per pt takes for leg cramps, Disp: , Rfl:    pravastatin (PRAVACHOL) 20 MG tablet, Take 1 tablet (20 mg total) by mouth daily., Disp: 90 tablet, Rfl: 1 Medication Side Effects: none  Family Medical/ Social History: Changes? No  MENTAL HEALTH EXAM:  There were no vitals taken for this visit.There is no height or weight on file to calculate BMI.  General Appearance: Casual and Neat  Eye Contact:  Good  Speech:  Clear and Coherent  Volume:  Normal  Mood:  NA  Affect:  Appropriate  Thought Process:  Coherent  Orientation:  Full (Time, Place, and Person)  Thought Content: Logical   Suicidal Thoughts:  No  Homicidal Thoughts:  No  Memory:  WNL  Judgement:  Good  Insight:  Good  Psychomotor Activity:  Normal  Concentration:  Concentration: Good  Recall:  Good  Fund of Knowledge: Good  Language: Good  Assets:  Desire for Improvement  ADL's:  Intact  Cognition: WNL  Prognosis:  Good    DIAGNOSES:    ICD-10-CM   1. Attention deficit hyperactivity disorder (ADHD), predominantly inattentive type  F90.0 amphetamine-dextroamphetamine (ADDERALL) 15 MG tablet    2. Generalized  anxiety disorder  F41.1     3. Recurrent major depressive disorder, in partial remission (HCC)  F33.41       Receiving Psychotherapy: No    RECOMMENDATIONS:   Greater than 50% of face to face time with patient was spent on counseling and coordination of care.  No changes this visit. He continues to enjoy very good stability.  No changes or adjustments to medication indicated at this time. Wife assist in organizing and requesting refills for him. No concerns this visit.  Reinforced importance of monitoring BP  regularly.    We agreed to:   Will continue Adderall 15 mg three time daily as needed To continue Lexapro 20 mg daily To report side effects or worsening symtoms To follow up in 6 months  to reassess Provided emergency contact information Reviewed PDMP      Joan Flores, NP

## 2023-04-07 NOTE — Assessment & Plan Note (Signed)
Continue taking statin, tolerating well encouraged to be adherent to taking daily.

## 2023-04-07 NOTE — Assessment & Plan Note (Addendum)
Blood pressure is better can.  Continue current medications. EKG done with NSR HR 80

## 2023-04-08 LAB — LIPID PANEL
Chol/HDL Ratio: 4.9 ratio (ref 0.0–5.0)
Cholesterol, Total: 264 mg/dL — ABNORMAL HIGH (ref 100–199)
HDL: 54 mg/dL (ref >39)
LDL Chol Calc (NIH): 187 mg/dL — ABNORMAL HIGH (ref 0–99)
Triglycerides: 129 mg/dL (ref 0–149)
VLDL Cholesterol Cal: 23 mg/dL (ref 5–40)

## 2023-04-08 LAB — CMP14+EGFR
ALT: 28 [IU]/L (ref 0–44)
AST: 32 [IU]/L (ref 0–40)
Albumin: 4.3 g/dL (ref 3.8–4.9)
Alkaline Phosphatase: 130 [IU]/L — ABNORMAL HIGH (ref 44–121)
BUN/Creatinine Ratio: 13 (ref 9–20)
BUN: 13 mg/dL (ref 6–24)
Bilirubin Total: 0.3 mg/dL (ref 0.0–1.2)
CO2: 23 mmol/L (ref 20–29)
Calcium: 9.9 mg/dL (ref 8.7–10.2)
Chloride: 103 mmol/L (ref 96–106)
Creatinine, Ser: 0.98 mg/dL (ref 0.76–1.27)
Globulin, Total: 2.9 g/dL (ref 1.5–4.5)
Glucose: 107 mg/dL — ABNORMAL HIGH (ref 70–99)
Potassium: 3.9 mmol/L (ref 3.5–5.2)
Sodium: 140 mmol/L (ref 134–144)
Total Protein: 7.2 g/dL (ref 6.0–8.5)
eGFR: 93 mL/min/{1.73_m2} (ref >59)

## 2023-04-08 LAB — CBC WITH DIFFERENTIAL/PLATELET
Basophils Absolute: 0.1 10*3/uL (ref 0.0–0.2)
Basos: 1 %
EOS (ABSOLUTE): 0.2 10*3/uL (ref 0.0–0.4)
Eos: 3 %
Hematocrit: 47.2 % (ref 37.5–51.0)
Hemoglobin: 15.6 g/dL (ref 13.0–17.7)
Immature Grans (Abs): 0 10*3/uL (ref 0.0–0.1)
Immature Granulocytes: 1 %
Lymphocytes Absolute: 1.7 10*3/uL (ref 0.7–3.1)
Lymphs: 35 %
MCH: 29.2 pg (ref 26.6–33.0)
MCHC: 33.1 g/dL (ref 31.5–35.7)
MCV: 88 fL (ref 79–97)
Monocytes Absolute: 0.5 10*3/uL (ref 0.1–0.9)
Monocytes: 9 %
Neutrophils Absolute: 2.5 10*3/uL (ref 1.4–7.0)
Neutrophils: 51 %
Platelets: 332 10*3/uL (ref 150–450)
RBC: 5.34 x10E6/uL (ref 4.14–5.80)
RDW: 14.5 % (ref 11.6–15.4)
WBC: 4.9 10*3/uL (ref 3.4–10.8)

## 2023-04-08 LAB — PSA: Prostate Specific Ag, Serum: 0.6 ng/mL (ref 0.0–4.0)

## 2023-04-08 LAB — HEMOGLOBIN A1C
Est. average glucose Bld gHb Est-mCnc: 137 mg/dL
Hgb A1c MFr Bld: 6.4 % — ABNORMAL HIGH (ref 4.8–5.6)

## 2023-04-13 ENCOUNTER — Encounter: Payer: Self-pay | Admitting: Nurse Practitioner

## 2023-04-13 DIAGNOSIS — R21 Rash and other nonspecific skin eruption: Secondary | ICD-10-CM | POA: Insufficient documentation

## 2023-04-13 NOTE — Assessment & Plan Note (Signed)
 Continue f/u with Behavioral Health

## 2023-04-13 NOTE — Assessment & Plan Note (Signed)
Declines shingrix, educated on disease process and is aware if he changes his mind to notify office  

## 2023-04-13 NOTE — Assessment & Plan Note (Signed)

## 2023-04-13 NOTE — Assessment & Plan Note (Signed)
Behavior modifications discussed and diet history reviewed.   Pt will continue to exercise regularly and modify diet with low GI, plant based foods and decrease intake of processed foods.  Recommend intake of daily multivitamin, Vitamin D, and calcium.  Recommend colonoscopy for preventive screenings, as well as recommend immunizations that include influenza, TDAP, and Shingles

## 2023-04-13 NOTE — Assessment & Plan Note (Signed)
Smoking cessation instruction/counseling given:  counseled patient on the dangers of tobacco use, advised patient to stop smoking, and reviewed strategies to maximize success 

## 2023-04-13 NOTE — Assessment & Plan Note (Signed)

## 2023-04-13 NOTE — Assessment & Plan Note (Signed)
Wax is removed by with lavage with elephant ear with 1/2 water and 1/2 peroxide. Instructions for home care to prevent wax buildup are given.

## 2023-04-13 NOTE — Assessment & Plan Note (Signed)
Scaly rash to left lower extremity, will refer to Dermatology

## 2023-04-13 NOTE — Assessment & Plan Note (Signed)
HgbA1c is stable, continue focusing on healthy diet

## 2023-04-28 DIAGNOSIS — M25561 Pain in right knee: Secondary | ICD-10-CM | POA: Diagnosis not present

## 2023-05-12 DIAGNOSIS — M25561 Pain in right knee: Secondary | ICD-10-CM | POA: Diagnosis not present

## 2023-05-16 ENCOUNTER — Telehealth: Payer: Self-pay | Admitting: Behavioral Health

## 2023-05-16 NOTE — Telephone Encounter (Signed)
LF 12/5, due 1/2

## 2023-05-16 NOTE — Telephone Encounter (Signed)
Patient's wife called in for refill on Adderall 15mg . Ph: [phone] appt 5/21 Pharmacy [facility] [address] [place], [place]

## 2023-05-19 ENCOUNTER — Other Ambulatory Visit: Payer: Self-pay

## 2023-05-19 DIAGNOSIS — F900 Attention-deficit hyperactivity disorder, predominantly inattentive type: Secondary | ICD-10-CM

## 2023-05-19 MED ORDER — AMPHETAMINE-DEXTROAMPHETAMINE 15 MG PO TABS: 15.0000 mg | ORAL_TABLET | Freq: Three times a day (TID) | ORAL | 0 refills | Status: DC

## 2023-05-19 NOTE — Telephone Encounter (Signed)
 Pended Adderall 15 mg TID to [facility] [address]

## 2023-05-24 DIAGNOSIS — M25561 Pain in right knee: Secondary | ICD-10-CM | POA: Diagnosis not present

## 2023-06-18 ENCOUNTER — Other Ambulatory Visit: Payer: Self-pay | Admitting: Nurse Practitioner

## 2023-06-20 ENCOUNTER — Encounter: Payer: Self-pay | Admitting: Internal Medicine

## 2023-06-20 ENCOUNTER — Ambulatory Visit (INDEPENDENT_AMBULATORY_CARE_PROVIDER_SITE_OTHER): Payer: Medicaid Other | Admitting: Internal Medicine

## 2023-06-20 VITALS — BP 136/88 | HR 92 | Ht 74.0 in | Wt 245.0 lb

## 2023-06-20 DIAGNOSIS — E0590 Thyrotoxicosis, unspecified without thyrotoxic crisis or storm: Secondary | ICD-10-CM

## 2023-06-20 DIAGNOSIS — E0500 Thyrotoxicosis with diffuse goiter without thyrotoxic crisis or storm: Secondary | ICD-10-CM | POA: Diagnosis not present

## 2023-06-20 DIAGNOSIS — Z8639 Personal history of other endocrine, nutritional and metabolic disease: Secondary | ICD-10-CM | POA: Diagnosis not present

## 2023-06-20 LAB — T4, FREE: Free T4: 1.7 ng/dL (ref 0.8–1.8)

## 2023-06-20 LAB — T3, FREE: T3, Free: 3.4 pg/mL (ref 2.3–4.2)

## 2023-06-20 LAB — TSH: TSH: 4.31 m[IU]/L (ref 0.40–4.50)

## 2023-06-20 NOTE — Progress Notes (Unsigned)
Name: Steven Gibson  MRN/ DOB: [id number], [birth year]    Age/ Sex: 53 y.o., male    PCP: [name], FNP   Reason for Endocrinology Evaluation: Graves' Disease     Date of Initial Endocrinology Evaluation: 06/17/2022    HPI: Steven Gibson is a 54 y.o. male with a past medical history of HTN and dylipidemia . The patient presented for initial endocrinology clinic visit on 06/17/2022 for consultative assistance with his Graves' Disease.    He was diagnosed with graves' disease > 20 yrs ago, he was initially on Methimazole but  subsequently was treated with RAI ablation With normalization of TFT until he presented with hyperthyroidism in12/2023 with a suppressed TSH <0.005 uIU/mL with elevated total T4 13.3ug/dL   Pt was started on Methimazole 04/2022   Father with Graves' disease and 2 daughters   On his initial visit with me he was on methimazole 3 times daily  TRAb negative  SUBJECTIVE:    Today (06/20/23):  [name] is here for a follow up on hyperthyroidism.   Weight has been stable Denies local neck swelling  Denies palpitations Denies tremors  Denies constipation or diarrhea  Has left eye orbitopathy - with dryness  Has left eye burning and itching and double vision , scheduled to see opthalmology this week    Methimazole 5 mg , 1 tab daily   HISTORY:  Past Medical History:  Past Medical History:  Diagnosis Date   Arthritis    Arthrofibrosis of knee joint, left    post op TKA    History of Graves' disease 2008   s/p  RAI  same year , on medication for a year then stablized no meds since  (per pt followed by pcp)   Hyperlipidemia    Hypertension    followed by pcp   (in care everywhere pt had stress echo, 03-26-2016, negative for ishemia and echo normal)   Wears glasses    Past Surgical History:  Past Surgical History:  Procedure Laterality Date   ANTERIOR CRUCIATE LIGAMENT REPAIR Left 1990S   KNEE ARTHROSCOPY W/ MEDIAL  COLLATERAL LIGAMENT (MCL) REPAIR Left 1990s   KNEE ARTHROSCOPY W/ MENISCAL REPAIR Left 1990s   KNEE CLOSED REDUCTION Left 10/21/2020   Procedure: CLOSED MANIPULATION LEFT KNEE;  Surgeon: Teryl Lucy, MD;  Location: [facility];  Service: Orthopedics;  Laterality: Left;   LIPOMA EXCISION     LEF SHOULDER AREA   TOTAL KNEE ARTHROPLASTY Left 05/06/2020   Procedure: TOTAL KNEE ARTHROPLASTY;  Surgeon: Teryl Lucy, MD;  Location: [facility];  Service: Orthopedics;  Laterality: Left;   UMBILICAL HERNIA REPAIR  07-17-2015  @[email or web address]    AND RIGHT ABDOMINAL WALL EXCISION LIPOMA    Social History:  reports that he has been smoking cigarettes. He has a 12.5 pack-year smoking history. He quit smokeless tobacco use about 34 years ago. He reports current alcohol use. He reports that he does not currently use drugs after having used the following drugs: Cocaine. Family History: family history includes Cancer in his paternal grandfather; Hypertension in his brother, brother, father, and mother.   HOME MEDICATIONS: Allergies as of 06/20/2023   No Known Allergies      Medication List        Accurate as of June 20, 2023  9:42 AM. If you have any questions, ask your nurse or doctor.          amLODipine 10 MG tablet Commonly known as: NORVASC  Take 1 tablet (10 mg total) by mouth daily.   amphetamine-dextroamphetamine 15 MG tablet Commonly known as: Adderall Take 1 tablet by mouth 3 (three) times daily. Start taking on: July 14, 2023 What changed: Another medication with the same name was removed. Continue taking this medication, and follow the directions you see here. Changed by: Johnney Ou Delos Klich   clobetasol cream 0.05 % Commonly known as: TEMOVATE Apply 1 Application topically 2 (two) times daily.   escitalopram 20 MG tablet Commonly known as: LEXAPRO TAKE 1 TABLET BY MOUTH EVERY DAY   hydrALAZINE 25 MG tablet Commonly known as: APRESOLINE TAKE 1 TABLET TWICE  DAILY, AM & NIGHT   K+ POTASSIUM PO Take by mouth. Over the counter, per pt takes for leg cramps   lisinopril-hydrochlorothiazide 20-25 MG tablet Commonly known as: ZESTORETIC Take 1 tablet by mouth daily.   meloxicam 15 MG tablet Commonly known as: MOBIC Take 1 tablet (15 mg total) by mouth daily.   methimazole 5 MG tablet Commonly known as: TAPAZOLE Take 1 tablet (5 mg total) by mouth daily.   methocarbamol 500 MG tablet Commonly known as: ROBAXIN Take 500-1,000 mg by mouth every 6 (six) hours as needed.   pravastatin 20 MG tablet Commonly known as: PRAVACHOL Take 1 tablet (20 mg total) by mouth daily.          REVIEW OF SYSTEMS: A comprehensive ROS was conducted with the patient and is negative except as per HPI and     OBJECTIVE:  VS: BP 136/88 (BP Location: Left Arm, Patient Position: Sitting, Cuff Size: Large)   Pulse 92   Ht 6\' 2"  (1.88 m)   Wt 245 lb (111.1 kg)   SpO2 97%   BMI 31.46 kg/m    Wt Readings from Last 3 Encounters:  06/20/23 245 lb (111.1 kg)  04/07/23 247 lb 12.8 oz (112.4 kg)  12/17/22 241 lb (109.3 kg)     EXAM: General: Pt appears well and is in NAD  Eyes: External eye exam with a stare, left eye  exophthalmos with chemosis .EOM intact.    Neck: General: Supple without adenopathy. Thyroid: Thyroid size normal.  No goiter or nodules appreciated.   Lungs: Clear with good BS bilat   Heart: Auscultation: RRR.  Abdomen: soft, nontender  Extremities:  BL LE: No pretibial edema  Mental Status: Judgment, insight: Intact Orientation: Oriented to time, place, and person Mood and affect: No depression, anxiety, or agitation     DATA REVIEWED:  ****    Latest Reference Range & Units 04/07/23 11:09  Sodium 134 - 144 mmol/L 140  Potassium 3.5 - 5.2 mmol/L 3.9  Chloride 96 - 106 mmol/L 103  CO2 20 - 29 mmol/L 23  Glucose 70 - 99 mg/dL 161 (H)  BUN 6 - 24 mg/dL 13  Creatinine 0.96 - 0.45 mg/dL 4.09  Calcium 8.7 - 81.1 mg/dL 9.9   BUN/Creatinine Ratio 9 - 20  13  eGFR >59 mL/min/1.73 93  Alkaline Phosphatase 44 - 121 IU/L 130 (H)  Albumin 3.8 - 4.9 g/dL 4.3  AST 0 - 40 IU/L 32  ALT 0 - 44 IU/L 28  Total Protein 6.0 - 8.5 g/dL 7.2  Total Bilirubin 0.0 - 1.2 mg/dL 0.3  (H): Data is abnormally high      Latest Reference Range & Units 06/17/22 12:21  TRAB <=2.00 IU/L <1.00     ASSESSMENT/PLAN/RECOMMENDATIONS:   Hyperthyroidism:  -This is due to Graves' disease -He is s/p RAI over 2  decades ago with normalization of TFTs until 04/2022 when he presented with recurrence -Patient is clinically euthyroid -We did discuss alternative options to include continuation of thionamides therapy, versus another RAI versus total thyroidectomy.  Given left orbitopathy I would  NOT recommend proceeding with RAI, so his only option is  surgical therapy v versus continue methimazole -TFTs ***   Medications : Decrease  2.  Graves' disease:  -Patient with Graves' orbitopathy, a referral has been placed to ophthalmology -Patient's to see ophthalmology this Thursday    F/U in 6 months   Signed electronically by: Lyndle Herrlich, MD  [facility]  [facility] [address] [place], [place] [address] Phone: [phone] FAX: [phone]   CC: [name], FNP [address] [place] [address] Phone: [phone] Fax: [phone]   Return to Endocrinology clinic as below: Future Appointments  Date Time Provider Department Center  10/05/2023 10:00 AM Joan Flores, NP [facility] None  10/06/2023 10:00 AM [name], FNP [facility] None  04/09/2024  9:20 AM [name], FNP [facility] None

## 2023-06-21 ENCOUNTER — Encounter: Payer: Self-pay | Admitting: Internal Medicine

## 2023-06-21 MED ORDER — METHIMAZOLE 5 MG PO TABS: 5.0000 mg | ORAL_TABLET | ORAL | 3 refills | Status: AC

## 2023-06-22 ENCOUNTER — Telehealth: Payer: Self-pay

## 2023-06-22 NOTE — Telephone Encounter (Signed)
 Pt called and left voicemail today and I returned call.  Left a voicemail.

## 2023-06-23 ENCOUNTER — Encounter: Payer: Self-pay | Admitting: Rehabilitative and Restorative Service Providers"

## 2023-06-23 ENCOUNTER — Other Ambulatory Visit: Payer: Self-pay

## 2023-06-23 ENCOUNTER — Ambulatory Visit: Payer: Medicaid Other | Attending: Orthopedic Surgery | Admitting: Rehabilitative and Restorative Service Providers"

## 2023-06-23 DIAGNOSIS — M25561 Pain in right knee: Secondary | ICD-10-CM | POA: Diagnosis not present

## 2023-06-23 DIAGNOSIS — R2689 Other abnormalities of gait and mobility: Secondary | ICD-10-CM | POA: Diagnosis not present

## 2023-06-23 DIAGNOSIS — R262 Difficulty in walking, not elsewhere classified: Secondary | ICD-10-CM | POA: Diagnosis not present

## 2023-06-23 DIAGNOSIS — H40013 Open angle with borderline findings, low risk, bilateral: Secondary | ICD-10-CM | POA: Diagnosis not present

## 2023-06-23 DIAGNOSIS — M6281 Muscle weakness (generalized): Secondary | ICD-10-CM | POA: Insufficient documentation

## 2023-06-23 DIAGNOSIS — E0500 Thyrotoxicosis with diffuse goiter without thyrotoxic crisis or storm: Secondary | ICD-10-CM | POA: Diagnosis not present

## 2023-06-23 NOTE — Therapy (Signed)
 OUTPATIENT PHYSICAL THERAPY LOWER EXTREMITY EVALUATION   Patient Name: Steven Gibson MRN: [id number] DOB:Sep 24, 1970, 53 y.o., male Today's Date: 06/23/2023  END OF SESSION:  PT End of Session - 06/23/23 1156     Visit Number 1    Date for PT Re-Evaluation 08/19/23    PT Start Time 1148    PT Stop Time 1230    PT Time Calculation (min) 42 min    Activity Tolerance Patient tolerated treatment well    Behavior During Therapy [facility] for tasks assessed/performed             Past Medical History:  Diagnosis Date   Arthritis    Arthrofibrosis of knee joint, left    post op TKA    History of Graves' disease 2008   s/p  RAI  same year , on medication for a year then stablized no meds since  (per pt followed by pcp)   Hyperlipidemia    Hypertension    followed by pcp   (in care everywhere pt had stress echo, 03-26-2016, negative for ishemia and echo normal)   Wears glasses    Past Surgical History:  Procedure Laterality Date   ANTERIOR CRUCIATE LIGAMENT REPAIR Left 1990S   KNEE ARTHROSCOPY W/ MEDIAL COLLATERAL LIGAMENT (MCL) REPAIR Left 1990s   KNEE ARTHROSCOPY W/ MENISCAL REPAIR Left 1990s   KNEE CLOSED REDUCTION Left 10/21/2020   Procedure: CLOSED MANIPULATION LEFT KNEE;  Surgeon: Josefina Chew, MD;  Location: [facility];  Service: Orthopedics;  Laterality: Left;   LIPOMA EXCISION     LEF SHOULDER AREA   TOTAL KNEE ARTHROPLASTY Left 05/06/2020   Procedure: TOTAL KNEE ARTHROPLASTY;  Surgeon: Josefina Chew, MD;  Location: [facility];  Service: Orthopedics;  Laterality: Left;   UMBILICAL HERNIA REPAIR  07-17-2015  @[email or web address]    AND RIGHT ABDOMINAL WALL EXCISION LIPOMA   Patient Active Problem List   Diagnosis Date Noted   Rash and nonspecific skin eruption 04/13/2023   Encounter for annual health examination 04/07/2023   COVID-19 vaccination declined 04/07/2023   Influenza vaccination declined 04/07/2023   Herpes zoster vaccination declined 04/07/2023    Encounter for prostate cancer screening 04/07/2023   Impacted cerumen of right ear 04/07/2023   Arthritis 09/01/2022   Prediabetes 05/03/2022   Class 1 obesity due to excess calories with serious comorbidity and body mass index (BMI) of 31.0 to 31.9 in adult 05/03/2022   Complication associated with orthopedic device (HCC) 02/10/2022   Mood disorder (HCC) 08/18/2021   Osteoarthritis of left knee 06/04/2021   S/P total knee arthroplasty, left 05/06/2020   Eczema 04/17/2019   Mixed hyperlipidemia 08/02/2018   Numbness and tingling in both hands 08/02/2018   Essential hypertension 07/24/2018   H/O Graves' disease 07/24/2018   Attention deficit disorder 07/24/2018   Dyslipidemia 03/26/2016   Family history of premature CAD 03/26/2016   Tobacco abuse 08/13/2014   GERD without esophagitis 08/13/2014   Lumbar herniated disc 08/13/2014    PCP: Georgina Speaks, FNP  REFERRING PROVIDER: Edna Toribio LABOR, MD  REFERRING DIAG: medial knee OA with acute flare  THERAPY DIAG:  Right knee pain, unspecified chronicity  Difficulty in walking, not elsewhere classified  Muscle weakness (generalized)  Other abnormalities of gait and mobility  Rationale for Evaluation and Treatment: Rehabilitation  ONSET DATE: Chronic pain, but has been worse the last 2 months  SUBJECTIVE:   SUBJECTIVE STATEMENT: Patient states that he has had chronic pain, but 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  Patient has a history of left TKA.  Patient states that he has faught through the pain for 3 months and has been using the ice cooler after work to help his knee get better.  PERTINENT HISTORY: HTN, Graves Disease, OA, Left Knee TKA 05/06/2020  PAIN:  Are you having pain? Yes: NPRS scale: 5-8/10 Pain location: right knee Pain description: aching Aggravating factors: operating the forklift foot pedals, prolonged knee flexion Relieving  factors: cold pack, rest  PRECAUTIONS: None  RED FLAGS: None   WEIGHT BEARING RESTRICTIONS: No  FALLS:  Has patient fallen in last 6 months? 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  NEXT MD VISIT: As Needed  OBJECTIVE:  Note: Objective measures were completed at Evaluation unless otherwise noted.  DIAGNOSTIC FINDINGS:  NM Bone Scan on 02/01/2022: IMPRESSION: 1. Persistent but decreased radiotracer activity surrounding the left knee arthroplasty on the vascular phase of the exam, consistent with improving hyperemia and synovitis. 2. Persistent abnormal radiotracer activity along the medial aspect of the tibial component of the left knee arthroplasty, which may reflect loosening. No change since prior exam. 3. Unremarkable right knee.  PATIENT SURVEYS:  Eval:  LEFS 30 / 80 = 37.5 %  COGNITION: Overall cognitive status: Within functional limits for tasks assessed     SENSATION: Patient denies numbness or tingling  EDEMA:  Circumferential: right- 43 cm, left- 43 cm  MUSCLE LENGTH: Hamstrings: tightness bilaterally  PALPATION: Right knee tender to palpation  LOWER EXTREMITY ROM:  Right knee A/ROM 0-110 degrees  LOWER EXTREMITY MMT:  Eval:   Bilateral hip strength of 4 to 4+/5 bilateral throughout, otherwise [facility]   FUNCTIONAL TESTS:  5 times sit to stand: 13.62 sec (decreased weight bearing through LLE) Single Leg Stance:  left- 5.68 sec, right- 6.68 sec  GAIT: Distance walked: >300 ft Assistive device utilized: None Level of  assistance: Complete Independence Comments: Pt with antalgic gait pattern                                                                                                                                TODAY'S TREATMENT  DATE: 06/23/2023 Issued HEP Seated hamstring stretch 2x20 sec bilat Standing at counter:  hip abduction, hip extension, marching.  X5 each bilat    PATIENT EDUCATION:  Education details: Issued HEP Person educated: Patient Education method: Explanation, Demonstration, and Handouts Education comprehension: verbalized understanding and returned demonstration  HOME EXERCISE PROGRAM: Access Code: [email or web address] URL: [email or web address] Date: 06/23/2023 Prepared by: Jarrell Win Guajardo  Exercises - Seated Hamstring Stretch  - 1 x daily - 7 x weekly - 2 reps - 20 sec hold - Standing Marching  - 1 x daily - 7 x weekly - 2 sets - 10 reps - Standing Hip Abduction  - 1 x daily - 7 x weekly - 2 sets - 10 reps - Standing Hip Extension  - 1 x daily - 7 x weekly - 2 sets - 10 reps - Single Leg Stance with Support  - 1 x daily - 7 x weekly - 2 reps - 20 sec hold  ASSESSMENT:  CLINICAL IMPRESSION: Patient is a 53 y.o. male who was seen today for physical therapy evaluation and treatment for right medial knee OA with acute flare. 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  He states that he started having some pain when he started operating the foot controls on the fork lift, but due to being on a new job, he was unable to take the time off to do PT.  He states that now, he is ready to take the time to focus on his right knee strengthening and decrease his pain.  Patient presents with right knee pain, muscle weakness, difficulty walking, and decreased balance.  Patient would benefit from skilled PT to progress him towards decreased pain with functional tasks.  OBJECTIVE IMPAIRMENTS: decreased balance, difficulty walking,  decreased strength, impaired flexibility, and pain.   ACTIVITY LIMITATIONS: standing, squatting, and stairs  PARTICIPATION LIMITATIONS: driving, community activity, and occupation  PERSONAL FACTORS: Past/current experiences, Time since onset of injury/illness/exacerbation, and 3+ comorbidities: OA, s/p L THA, Graves Disease  are also affecting patient's functional outcome.   REHAB POTENTIAL: Good  CLINICAL DECISION MAKING: Evolving/moderate complexity  EVALUATION COMPLEXITY: Moderate   GOALS: Goals reviewed with patient? Yes  SHORT TERM GOALS: Target date: 07/15/2023 Patient will be independent with initial HEP. Baseline: Goal status: INITIAL  2.  Patient to participate in 6 minute walk test to establish a baseline. Baseline:  Goal status: INITIAL   LONG TERM GOALS: Target date: 08/19/2023  Patient will be independent with advanced HEP to promote continued exercises post discharge. Baseline:  Goal status: INITIAL  2.  Patient will increase Lower Extremity Functional Scale to at least 50% to demonstrate increased independence with functional activities. Baseline: 37.5% Goal status: INITIAL  3.  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 Baseline:  Goal status: INITIAL  4.  Patient will report ability to start a walking/jogging exercise program without increased pain. Baseline:  Goal status: INITIAL  5.  Patient will increase his strength to [facility] to allow him to navigate stairs with reciprocal pattern. Baseline:  Goal status: INITIAL   PLAN:  PT FREQUENCY: 1-2x/week  PT DURATION: 8 weeks  PLANNED INTERVENTIONS: 97164- PT Re-evaluation, 97110-Therapeutic exercises, 97530- Therapeutic activity, 97112- Neuromuscular re-education, 97535- Self Care, 02859- Manual therapy, [phone]- Gait training, [phone]- Aquatic Therapy, 97014- Electrical stimulation (unattended), [phone]- Electrical stimulation (manual), 97016- Vasopneumatic device, 97035-  Ultrasound, Patient/Family education, Balance training, Stair training, Taping, Dry Needling, Joint mobilization, Joint manipulation, Spinal manipulation, Spinal mobilization, Cryotherapy, and Moist heat  PLAN FOR NEXT SESSION: Assess and progress HEP as indicated, strengthening, flexibility, manual/dry needling as indicated    Jarrell Laming, PT, DPT 06/23/23, 11:57 AM  [facility] [address]  [place], [place] [address] Phone # [phone] Fax [phone]

## 2023-06-28 ENCOUNTER — Ambulatory Visit: Payer: Medicaid Other | Admitting: Rehabilitative and Restorative Service Providers"

## 2023-06-28 ENCOUNTER — Encounter: Payer: Self-pay | Admitting: Rehabilitative and Restorative Service Providers"

## 2023-06-28 DIAGNOSIS — M6281 Muscle weakness (generalized): Secondary | ICD-10-CM | POA: Diagnosis not present

## 2023-06-28 DIAGNOSIS — R262 Difficulty in walking, not elsewhere classified: Secondary | ICD-10-CM | POA: Diagnosis not present

## 2023-06-28 DIAGNOSIS — R2689 Other abnormalities of gait and mobility: Secondary | ICD-10-CM

## 2023-06-28 DIAGNOSIS — M25561 Pain in right knee: Secondary | ICD-10-CM

## 2023-06-28 NOTE — Therapy (Addendum)
 OUTPATIENT PHYSICAL THERAPY TREATMENT NOTE   Patient Name: Steven Gibson MRN: [id number] DOB:10/25/1970, 53 y.o., male Today's Date: 06/28/2023  END OF SESSION:  PT End of Session - 06/28/23 0734     Visit Number 2    Date for PT Re-Evaluation 08/19/23    Authorization Type [place] [employer] (auth required at 27 visits)    Authorization - Visit Number 2    Authorization - Number of Visits 27    PT Start Time 0732    PT Stop Time 0800    PT Time Calculation (min) 28 min    Activity Tolerance Patient tolerated treatment well    Behavior During Therapy [facility] for tasks assessed/performed             Past Medical History:  Diagnosis Date   Arthritis    Arthrofibrosis of knee joint, left    post op TKA    History of Graves' disease 2008   s/p  RAI  same year , on medication for a year then stablized no meds since  (per pt followed by pcp)   Hyperlipidemia    Hypertension    followed by pcp   (in care everywhere pt had stress echo, 03-26-2016, negative for ishemia and echo normal)   Wears glasses    Past Surgical History:  Procedure Laterality Date   ANTERIOR CRUCIATE LIGAMENT REPAIR Left 1990S   KNEE ARTHROSCOPY W/ MEDIAL COLLATERAL LIGAMENT (MCL) REPAIR Left 1990s   KNEE ARTHROSCOPY W/ MENISCAL REPAIR Left 1990s   KNEE CLOSED REDUCTION Left 10/21/2020   Procedure: CLOSED MANIPULATION LEFT KNEE;  Surgeon: Osa Blase, MD;  Location: [facility];  Service: Orthopedics;  Laterality: Left;   LIPOMA EXCISION     LEF SHOULDER AREA   TOTAL KNEE ARTHROPLASTY Left 05/06/2020   Procedure: TOTAL KNEE ARTHROPLASTY;  Surgeon: Osa Blase, MD;  Location: [facility];  Service: Orthopedics;  Laterality: Left;   UMBILICAL HERNIA REPAIR  07-17-2015  @[email or web address]    AND RIGHT ABDOMINAL WALL EXCISION LIPOMA   Patient Active Problem List   Diagnosis Date Noted   Rash and nonspecific skin eruption 04/13/2023   Encounter for annual health examination 04/07/2023    COVID-19 vaccination declined 04/07/2023   Influenza vaccination declined 04/07/2023   Herpes zoster vaccination declined 04/07/2023   Encounter for prostate cancer screening 04/07/2023   Impacted cerumen of right ear 04/07/2023   Arthritis 09/01/2022   Prediabetes 05/03/2022   Class 1 obesity due to excess calories with serious comorbidity and body mass index (BMI) of 31.0 to 31.9 in adult 05/03/2022   Complication associated with orthopedic device (HCC) 02/10/2022   Mood disorder (HCC) 08/18/2021   Osteoarthritis of left knee 06/04/2021   S/P total knee arthroplasty, left 05/06/2020   Eczema 04/17/2019   Mixed hyperlipidemia 08/02/2018   Numbness and tingling in both hands 08/02/2018   Essential hypertension 07/24/2018   H/O Graves' disease 07/24/2018   Attention deficit disorder 07/24/2018   Dyslipidemia 03/26/2016   Family history of premature CAD 03/26/2016   Tobacco abuse 08/13/2014   GERD without esophagitis 08/13/2014   Lumbar herniated disc 08/13/2014    PCP: [name], FNP  REFERRING PROVIDER: Murleen Arms, MD  REFERRING DIAG: medial knee OA with acute flare  THERAPY DIAG:  Right knee pain, unspecified chronicity  Difficulty in walking, not elsewhere classified  Muscle weakness (generalized)  Other abnormalities of gait and mobility  Rationale for Evaluation and Treatment: Rehabilitation  ONSET DATE: Chronic pain, but  has been worse the last 2 months  SUBJECTIVE:   SUBJECTIVE STATEMENT: Patient reports that the stretches have been helping some.  PERTINENT HISTORY: HTN, Graves Disease, OA, Left Knee TKA 05/06/2020  PAIN:  Are you having pain? 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  NEXT MD VISIT: As Needed  OBJECTIVE:  Note: Objective measures were completed at Evaluation unless otherwise noted.  DIAGNOSTIC FINDINGS:  NM Bone Scan on 02/01/2022: IMPRESSION: 1. Persistent but decreased radiotracer activity surrounding the left knee arthroplasty on the vascular phase of the exam, consistent with improving hyperemia and synovitis. 2. Persistent abnormal radiotracer activity along the medial aspect of the tibial component of the left knee arthroplasty, which may reflect loosening. No change since prior exam. 3. Unremarkable right knee.  PATIENT SURVEYS:  Eval:  LEFS 30 / 80 = 37.5 %  COGNITION: Overall cognitive status: Within functional limits for tasks assessed     SENSATION: Patient denies numbness or tingling  EDEMA:  Circumferential: right- 43 cm, left- 43 cm  MUSCLE LENGTH: Hamstrings: tightness bilaterally  PALPATION: Right knee tender to palpation  LOWER EXTREMITY ROM:  Right knee A/ROM 0-110 degrees  LOWER EXTREMITY MMT:  Eval:   Bilateral hip strength of 4 to 4+/5 bilateral throughout, otherwise [facility]   FUNCTIONAL TESTS:  Eval: 5 times sit to stand: 13.62 sec (decreased weight bearing through LLE) Single Leg Stance:  left- 5.68 sec, right- 6.68 sec  06/28/2023: 6 Minute walk test:  1,224 ft with pain increasing to 7/10  GAIT: Distance walked: >300 ft Assistive device utilized: None Level of assistance: Complete Independence Comments: Pt with  antalgic gait pattern                                                                                                                                TODAY'S TREATMENT   DATE: 06/28/2023 Nustep level 4 x5 min with PT present to discuss status Seated hamstring stretch 2x20 sec bilat Seated piriformis stretch 2x20 sec bilat 6 Minute walk test:  1,224 ft with pain increasing to 7/10 Attempted standing quad stretch, but unable to complete secondary to cramping Prone hamstring curl 2x10 bilat Prone hip extension x10 bilat Left  sidelying position: Addaday to right hamstring, IT band, and quad to promote decreased spasms, decreased pain, and tissue elongation.   DATE: 06/23/2023 Issued HEP Seated hamstring stretch 2x20 sec bilat Standing at counter:  hip abduction, hip extension, marching.  X5 each bilat    PATIENT EDUCATION:  Education details: Issued HEP Person educated: Patient Education method: Explanation, Demonstration, and Handouts Education comprehension: verbalized understanding and returned demonstration  HOME EXERCISE PROGRAM: Access Code: [email or web address] URL: [email or web address] Date: 06/23/2023 Prepared by: Chaneta Comer Kjersti Dittmer  Exercises - Seated Hamstring Stretch  - 1 x daily - 7 x weekly - 2 reps - 20 sec hold - Standing Marching  - 1 x daily - 7 x weekly - 2 sets - 10 reps - Standing Hip Abduction  - 1 x daily - 7 x weekly - 2 sets - 10 reps - Standing Hip Extension  - 1 x daily - 7 x weekly - 2 sets - 10 reps - Single Leg Stance with Support  - 1 x daily - 7 x weekly - 2 reps - 20 sec hold  ASSESSMENT:  CLINICAL IMPRESSION: Mr Cassada presents to skilled PT reporting that he is doing his exercises and feeling some better.  He states that the hamstring stretch has been helpful.  Patient with some cramping noted with his hamstrings towards end of session and with difficulty performing quad stretching.  Patient did report decreased cramping following manual therapy  with addaday.  Patient continues to require skilled PT to progress towards goal related activities.  OBJECTIVE IMPAIRMENTS: decreased balance, difficulty walking, decreased strength, impaired flexibility, and pain.   ACTIVITY LIMITATIONS: standing, squatting, and stairs  PARTICIPATION LIMITATIONS: driving, community activity, and occupation  PERSONAL FACTORS: Past/current experiences, Time since onset of injury/illness/exacerbation, and 3+ comorbidities: OA, s/p L THA, Graves Disease are also affecting patient's functional outcome.   REHAB POTENTIAL: Good  CLINICAL DECISION MAKING: Evolving/moderate complexity  EVALUATION COMPLEXITY: Moderate   GOALS: Goals reviewed with patient? Yes  SHORT TERM GOALS: Target date: 07/15/2023 Patient will be independent with initial HEP. Baseline: Goal status: Ongoing  2.  Patient to participate in 6 minute walk test to establish a baseline. Baseline:  Goal status: Met on 06/28/2023   LONG TERM GOALS: Target date: 08/19/2023  Patient will be independent with advanced HEP to promote continued exercises post discharge. Baseline:  Goal status: INITIAL  2.  Patient will increase Lower Extremity Functional Scale to at least 50% to demonstrate increased independence with functional activities. Baseline: 37.5% Goal status: INITIAL  3.  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 Baseline:  Goal status: INITIAL  4.  Patient will report ability to start a walking/jogging exercise program without increased pain. Baseline:  Goal status: INITIAL  5.  Patient will increase his strength to [facility] to allow him to navigate stairs with reciprocal pattern. Baseline:  Goal status: INITIAL   PLAN:  PT FREQUENCY: 1-2x/week  PT DURATION: 8 weeks  PLANNED INTERVENTIONS: 97164- PT Re-evaluation, 97110-Therapeutic exercises, 97530- Therapeutic activity, [id number]- Neuromuscular re-education, 97535- Self Care, 91478- Manual  therapy, [id number]- Gait training, [phone]- Aquatic Therapy, 97014- Electrical stimulation (unattended), [id number]- Electrical stimulation (manual), 97016- Vasopneumatic device, 97035- Ultrasound, Patient/Family education, Balance training, Stair training, Taping, Dry Needling, Joint mobilization, Joint manipulation, Spinal manipulation, Spinal mobilization, Cryotherapy, and Moist heat  PLAN FOR NEXT SESSION: Assess and progress HEP as indicated, strengthening, flexibility, manual/dry needling as indicated    Robyne Christen, PT, DPT 06/28/23, 9:20 AM  [facility] [address] [place], [place] [address] Phone # [phone] Fax [phone]  PHYSICAL THERAPY DISCHARGE SUMMARY  Visits from Start of Care: 2  Current functional level related to goals / functional outcomes: Discharging patient due to not returning since last visit.   Patient agrees to discharge. Patient goals were not met. Patient is being discharged due to not returning since the last visit. Penelope Bowie, PT 10/06/23 4:36 PM

## 2023-06-29 ENCOUNTER — Encounter: Payer: Medicaid Other | Admitting: Rehabilitation

## 2023-06-29 ENCOUNTER — Ambulatory Visit: Payer: Medicaid Other

## 2023-06-29 ENCOUNTER — Ambulatory Visit: Payer: Medicaid Other | Admitting: Behavioral Health

## 2023-06-30 DIAGNOSIS — M25561 Pain in right knee: Secondary | ICD-10-CM | POA: Diagnosis not present

## 2023-06-30 DIAGNOSIS — M25562 Pain in left knee: Secondary | ICD-10-CM | POA: Diagnosis not present

## 2023-07-04 ENCOUNTER — Ambulatory Visit: Payer: Medicaid Other | Admitting: Physical Therapy

## 2023-07-04 ENCOUNTER — Telehealth: Payer: Self-pay | Admitting: Physical Therapy

## 2023-07-04 NOTE — Telephone Encounter (Signed)
 Spoke with patient about missed appointment. He forgot to call the office to cancel. He is scheduled to have an MRI tomorrow, 07/05/2023. His MD suspects he has a torn MCL. If imaging is positive MD plans to do a scope on Rt knee. Patient will return to therapy once cleared from MD.  Claude Manges, PT 07/04/23 8:41 AM

## 2023-07-05 DIAGNOSIS — M25561 Pain in right knee: Secondary | ICD-10-CM | POA: Diagnosis not present

## 2023-07-07 ENCOUNTER — Encounter: Payer: Medicaid Other | Admitting: Rehabilitative and Restorative Service Providers"

## 2023-07-11 ENCOUNTER — Encounter: Payer: Medicaid Other | Admitting: Rehabilitative and Restorative Service Providers"

## 2023-07-14 ENCOUNTER — Encounter: Payer: Medicaid Other | Admitting: Rehabilitative and Restorative Service Providers"

## 2023-07-18 ENCOUNTER — Encounter: Payer: Medicaid Other | Admitting: Rehabilitative and Restorative Service Providers"

## 2023-07-19 DIAGNOSIS — M25561 Pain in right knee: Secondary | ICD-10-CM | POA: Diagnosis not present

## 2023-07-21 ENCOUNTER — Encounter: Payer: Medicaid Other | Admitting: Rehabilitative and Restorative Service Providers"

## 2023-07-25 ENCOUNTER — Encounter: Payer: Medicaid Other | Admitting: Rehabilitative and Restorative Service Providers"

## 2023-07-28 ENCOUNTER — Encounter: Payer: Medicaid Other | Admitting: Rehabilitative and Restorative Service Providers"

## 2023-07-29 DIAGNOSIS — X58XXXA Exposure to other specified factors, initial encounter: Secondary | ICD-10-CM | POA: Diagnosis not present

## 2023-07-29 DIAGNOSIS — G8918 Other acute postprocedural pain: Secondary | ICD-10-CM | POA: Diagnosis not present

## 2023-07-29 DIAGNOSIS — Y999 Unspecified external cause status: Secondary | ICD-10-CM | POA: Diagnosis not present

## 2023-07-29 DIAGNOSIS — S83221A Peripheral tear of medial meniscus, current injury, right knee, initial encounter: Secondary | ICD-10-CM | POA: Diagnosis not present

## 2023-07-29 DIAGNOSIS — S83241A Other tear of medial meniscus, current injury, right knee, initial encounter: Secondary | ICD-10-CM | POA: Diagnosis not present

## 2023-08-01 ENCOUNTER — Encounter: Payer: Medicaid Other | Admitting: Rehabilitative and Restorative Service Providers"

## 2023-08-04 ENCOUNTER — Encounter: Payer: Medicaid Other | Admitting: Rehabilitative and Restorative Service Providers"

## 2023-08-08 ENCOUNTER — Encounter: Payer: Medicaid Other | Admitting: Rehabilitative and Restorative Service Providers"

## 2023-08-11 ENCOUNTER — Encounter: Payer: Medicaid Other | Admitting: Rehabilitative and Restorative Service Providers"

## 2023-08-15 ENCOUNTER — Encounter: Payer: Medicaid Other | Admitting: Rehabilitative and Restorative Service Providers"

## 2023-08-15 ENCOUNTER — Other Ambulatory Visit: Payer: Self-pay

## 2023-08-15 ENCOUNTER — Telehealth: Payer: Self-pay | Admitting: Behavioral Health

## 2023-08-15 DIAGNOSIS — F900 Attention-deficit hyperactivity disorder, predominantly inattentive type: Secondary | ICD-10-CM

## 2023-08-15 MED ORDER — AMPHETAMINE-DEXTROAMPHETAMINE 15 MG PO TABS
15.0000 mg | ORAL_TABLET | Freq: Three times a day (TID) | ORAL | 0 refills | Status: DC
Start: 2023-08-15 — End: 2023-09-13

## 2023-08-15 NOTE — Telephone Encounter (Signed)
 Pt called 11:31 requesting Rx adderall 15 mg 3/d [facility] [address]   apt 5/21

## 2023-08-15 NOTE — Telephone Encounter (Signed)
 [facility]

## 2023-08-18 ENCOUNTER — Encounter: Payer: Medicaid Other | Admitting: Rehabilitative and Restorative Service Providers"

## 2023-08-22 DIAGNOSIS — E0500 Thyrotoxicosis with diffuse goiter without thyrotoxic crisis or storm: Secondary | ICD-10-CM | POA: Diagnosis not present

## 2023-08-22 DIAGNOSIS — H40013 Open angle with borderline findings, low risk, bilateral: Secondary | ICD-10-CM | POA: Diagnosis not present

## 2023-08-27 ENCOUNTER — Encounter: Payer: Self-pay | Admitting: Nurse Practitioner

## 2023-08-30 ENCOUNTER — Encounter: Payer: Self-pay | Admitting: Nurse Practitioner

## 2023-08-30 ENCOUNTER — Ambulatory Visit: Admitting: Nurse Practitioner

## 2023-08-30 VITALS — BP 150/98 | HR 103 | Temp 98.1°F | Ht 74.0 in | Wt 253.0 lb

## 2023-08-30 DIAGNOSIS — E66811 Obesity, class 1: Secondary | ICD-10-CM | POA: Diagnosis not present

## 2023-08-30 DIAGNOSIS — I10 Essential (primary) hypertension: Secondary | ICD-10-CM

## 2023-08-30 DIAGNOSIS — E6609 Other obesity due to excess calories: Secondary | ICD-10-CM

## 2023-08-30 DIAGNOSIS — R051 Acute cough: Secondary | ICD-10-CM | POA: Insufficient documentation

## 2023-08-30 DIAGNOSIS — Z6832 Body mass index (BMI) 32.0-32.9, adult: Secondary | ICD-10-CM

## 2023-08-30 DIAGNOSIS — J302 Other seasonal allergic rhinitis: Secondary | ICD-10-CM | POA: Diagnosis not present

## 2023-08-30 MED ORDER — AZELASTINE HCL 137 MCG/SPRAY NA SOLN: 2.0000 | Freq: Every day | NASAL | 2 refills | Status: AC

## 2023-08-30 MED ORDER — TRIAMCINOLONE ACETONIDE 40 MG/ML IJ SUSP: 40.0000 mg | Freq: Once | INTRAMUSCULAR | Status: DC

## 2023-08-30 MED ORDER — TRIAMCINOLONE ACETONIDE 40 MG/ML IJ SUSP
60.0000 mg | Freq: Once | INTRAMUSCULAR | Status: AC
  Administered 2023-08-30: 60 mg via INTRAMUSCULAR

## 2023-08-30 NOTE — Assessment & Plan Note (Addendum)
 Blood pressure is slightly elevated, repeat remains elevated. He is currently having symptoms of allergies and has been taking a decongestant. Advised to avoid this.  he has continued with current medications.

## 2023-08-30 NOTE — Assessment & Plan Note (Signed)
 No abnormal lung sounds. Likely related to seasonal allergies.

## 2023-08-30 NOTE — Assessment & Plan Note (Signed)
 He is encouraged to strive for BMI less than 30 to decrease cardiac risk. Advised to aim for at least 150 minutes of exercise per week.

## 2023-08-30 NOTE — Progress Notes (Signed)
 Steven Gibson, CMA,acting as a Neurosurgeon for [employer], FNP.,have documented all relevant documentation on the behalf of Steven Epley, FNP,as directed by  Steven Epley, FNP while in the presence of Steven Epley, FNP.  Subjective:  Patient ID: Steven Gibson , male    DOB: 03-15-1971 , 53 y.o.   MRN: [id number]  Chief Complaint  Patient presents with   URI    HPI  Patient presents today for cold symptoms. He reports his symptoms started 2 weeks ago. He has been doing benadry. He has been taking claritin daily without relief. He is also using flonase.   URI  This is a new problem. The current episode started 1 to 4 weeks ago. The problem has been unchanged. There has been no fever. Associated symptoms include congestion, coughing and rhinorrhea. Pertinent negatives include no sinus pain or sneezing. He has tried antihistamine and decongestant for the symptoms. The treatment provided no relief.     Past Medical History:  Diagnosis Date   Arthritis    Arthrofibrosis of knee joint, left    post op TKA    History of Graves' disease 2008   s/p  RAI  same year , on medication for a year then stablized no meds since  (per pt followed by pcp)   Hyperlipidemia    Hypertension    followed by pcp   (in care everywhere pt had stress echo, 03-26-2016, negative for ishemia and echo normal)   Wears glasses      Family History  Problem Relation Age of Onset   Hypertension Mother    Hypertension Father    Hypertension Brother    Cancer Paternal Grandfather    Hypertension Brother      Current Outpatient Medications:    amLODipine  (NORVASC ) 10 MG tablet, Take 1 tablet (10 mg total) by mouth daily., Disp: 90 tablet, Rfl: 1   amphetamine -dextroamphetamine  (ADDERALL) 15 MG tablet, Take 1 tablet by mouth 3 (three) times daily., Disp: 90 tablet, Rfl: 0   Azelastine  HCl 137 MCG/SPRAY SOLN, Place 2 sprays into the nose daily., Disp: 30 mL, Rfl: 2   clobetasol  cream (TEMOVATE ) 0.05 %,  Apply 1 Application topically 2 (two) times daily., Disp: 90 g, Rfl: 0   escitalopram  (LEXAPRO ) 20 MG tablet, TAKE 1 TABLET BY MOUTH EVERY DAY, Disp: 90 tablet, Rfl: 1   hydrALAZINE  (APRESOLINE ) 25 MG tablet, TAKE 1 TABLET TWICE DAILY, AM & NIGHT, Disp: 180 tablet, Rfl: 1   lisinopril -hydrochlorothiazide  (ZESTORETIC ) 20-25 MG tablet, Take 1 tablet by mouth daily., Disp: 90 tablet, Rfl: 1   meloxicam  (MOBIC ) 15 MG tablet, Take 1 tablet (15 mg total) by mouth daily., Disp: 90 tablet, Rfl: 1   methimazole  (TAPAZOLE ) 5 MG tablet, Take 1 tablet (5 mg total) by mouth as directed. 1 tablet Monday through Saturday and none on Sundays, Disp: 78 tablet, Rfl: 3   methocarbamol  (ROBAXIN ) 500 MG tablet, Take 500-1,000 mg by mouth every 6 (six) hours as needed., Disp: , Rfl:    Potassium Chloride  (K+ POTASSIUM PO), Take by mouth. Over the counter, per pt takes for leg cramps, Disp: , Rfl:    pravastatin  (PRAVACHOL ) 20 MG tablet, Take 1 tablet (20 mg total) by mouth daily., Disp: 90 tablet, Rfl: 1   No Known Allergies   Review of Systems  Constitutional: Negative.   HENT:  Positive for congestion and rhinorrhea. Negative for sinus pain and sneezing.   Respiratory:  Positive for cough.   Neurological: Negative.  Psychiatric/Behavioral: Negative.       Today's Vitals   08/30/23 1619 08/30/23 1644  BP: (!) 150/90 (!) 150/98  Pulse: (!) 103   Temp: 98.1 F (36.7 C)   TempSrc: Oral   Weight: 253 lb (114.8 kg)   Height: 6\' 2"  (1.88 m)   PainSc: 0-No pain    Body mass index is 32.48 kg/m.  Wt Readings from Last 3 Encounters:  08/30/23 253 lb (114.8 kg)  06/20/23 245 lb (111.1 kg)  04/07/23 247 lb 12.8 oz (112.4 kg)     Objective:  Physical Exam Vitals and nursing note reviewed.  Constitutional:      General: He is not in acute distress.    Appearance: Normal appearance.  Cardiovascular:     Rate and Rhythm: Normal rate and regular rhythm.     Pulses: Normal pulses.     Heart sounds: Normal  heart sounds. No murmur heard. Pulmonary:     Effort: Pulmonary effort is normal. No respiratory distress.     Breath sounds: Normal breath sounds. No wheezing.  Skin:    General: Skin is warm and dry.     Capillary Refill: Capillary refill takes less than 2 seconds.  Neurological:     General: No focal deficit present.     Mental Status: He is alert and oriented to person, place, and time.     Cranial Nerves: No cranial nerve deficit.     Motor: No weakness.  Psychiatric:        Mood and Affect: Mood normal.        Behavior: Behavior normal.        Thought Content: Thought content normal.      Assessment And Plan:  Seasonal allergies Assessment & Plan: He has enlarged turbinates bilaterally will send Rx for Azestaline. Will also treat with a steroid injection.   Orders: -     Azelastine  HCl; Place 2 sprays into the nose daily.  Dispense: 30 mL; Refill: 2 -     Triamcinolone  Acetonide  Acute cough Assessment & Plan: No abnormal lung sounds. Likely related to seasonal allergies.   Orders: -     Triamcinolone  Acetonide  Essential hypertension Assessment & Plan: Blood pressure is slightly elevated, repeat remains elevated. He is currently having symptoms of allergies and has been taking a decongestant. Advised to avoid this.  he has continued with current medications.     Class 1 obesity due to excess calories with serious comorbidity and body mass index (BMI) of 32.0 to 32.9 in adult Assessment & Plan: He is encouraged to strive for BMI less than 30 to decrease cardiac risk. Advised to aim for at least 150 minutes of exercise per week.      No follow-ups on file.  Patient was given opportunity to ask questions. Patient verbalized understanding of the plan and was able to repeat key elements of the plan. All questions were answered to their satisfaction.    [name], FNP, have reviewed all documentation for this visit. The documentation on 08/30/23 for the  exam, diagnosis, procedures, and orders are all accurate and complete.   IF YOU HAVE BEEN REFERRED TO A SPECIALIST, IT MAY TAKE 1-2 WEEKS TO SCHEDULE/PROCESS THE REFERRAL. IF YOU HAVE NOT HEARD FROM US /SPECIALIST IN TWO WEEKS, PLEASE GIVE US  A CALL AT [phone] X [phone].

## 2023-08-30 NOTE — Patient Instructions (Signed)
 You can get over the counter zyrtec (cetirizine) to take at night for your allergies and I have sent in a different nasal spray this may work a little better.

## 2023-08-30 NOTE — Assessment & Plan Note (Signed)
 He has enlarged turbinates bilaterally will send Rx for Azestaline. Will also treat with a steroid injection.

## 2023-09-06 DIAGNOSIS — S83242D Other tear of medial meniscus, current injury, left knee, subsequent encounter: Secondary | ICD-10-CM | POA: Diagnosis not present

## 2023-09-13 ENCOUNTER — Telehealth: Payer: Self-pay | Admitting: Behavioral Health

## 2023-09-13 ENCOUNTER — Other Ambulatory Visit: Payer: Self-pay

## 2023-09-13 DIAGNOSIS — F900 Attention-deficit hyperactivity disorder, predominantly inattentive type: Secondary | ICD-10-CM

## 2023-09-13 MED ORDER — AMPHETAMINE-DEXTROAMPHETAMINE 15 MG PO TABS: 15.0000 mg | ORAL_TABLET | Freq: Three times a day (TID) | ORAL | 0 refills | Status: DC

## 2023-09-13 NOTE — Telephone Encounter (Signed)
 Pt's wife called at 4:29p.  She is on DPR.  She is requesting refill of Adderall to   [id number], [place] - [address] [place] [address] Phone: [phone]  Fax: [phone]    Next appt 5/21

## 2023-09-13 NOTE — Telephone Encounter (Signed)
 P[facility], [facility] [address] [facility]

## 2023-10-05 ENCOUNTER — Ambulatory Visit: Payer: BLUE CROSS/BLUE SHIELD | Admitting: Behavioral Health

## 2023-10-05 NOTE — Progress Notes (Signed)
 Pt did not show for scheduled appt and did not provide 24 hour notice as required. Additional fees to be assessed.

## 2023-10-05 NOTE — Progress Notes (Deleted)
 [place], CMA,acting as a Neurosurgeon for [name], FNP.,have documented all relevant documentation on the behalf of [name], FNP,as directed by  [name], FNP while in the presence of [name], FNP.  Subjective:  Patient ID: Steven Gibson , male    DOB: 15-Aug-1970 , 53 y.o.   MRN: [id number]  No chief complaint on file.   HPI  HPI   Past Medical History:  Diagnosis Date   Arthritis    Arthrofibrosis of knee joint, left    post op TKA    History of Graves' disease 2008   s/p  RAI  same year , on medication for a year then stablized no meds since  (per pt followed by pcp)   Hyperlipidemia    Hypertension    followed by pcp   (in care everywhere pt had stress echo, 03-26-2016, negative for ishemia and echo normal)   Wears glasses      Family History  Problem Relation Age of Onset   Hypertension Mother    Hypertension Father    Hypertension Brother    Cancer Paternal Grandfather    Hypertension Brother      Current Outpatient Medications:    amLODipine  (NORVASC ) 10 MG tablet, Take 1 tablet (10 mg total) by mouth daily., Disp: 90 tablet, Rfl: 1   amphetamine -dextroamphetamine  (ADDERALL) 15 MG tablet, Take 1 tablet by mouth 3 (three) times daily., Disp: 90 tablet, Rfl: 0   Azelastine  HCl 137 MCG/SPRAY SOLN, Place 2 sprays into the nose daily., Disp: 30 mL, Rfl: 2   clobetasol  cream (TEMOVATE ) 0.05 %, Apply 1 Application topically 2 (two) times daily., Disp: 90 g, Rfl: 0   escitalopram  (LEXAPRO ) 20 MG tablet, TAKE 1 TABLET BY MOUTH EVERY DAY, Disp: 90 tablet, Rfl: 1   hydrALAZINE  (APRESOLINE ) 25 MG tablet, TAKE 1 TABLET TWICE DAILY, AM & NIGHT, Disp: 180 tablet, Rfl: 1   lisinopril -hydrochlorothiazide  (ZESTORETIC ) 20-25 MG tablet, Take 1 tablet by mouth daily., Disp: 90 tablet, Rfl: 1   meloxicam  (MOBIC ) 15 MG tablet, Take 1 tablet (15 mg total) by mouth daily., Disp: 90 tablet, Rfl: 1   methimazole  (TAPAZOLE ) 5 MG tablet, Take 1 tablet (5 mg total)  by mouth as directed. 1 tablet Monday through Saturday and none on Sundays, Disp: 78 tablet, Rfl: 3   methocarbamol  (ROBAXIN ) 500 MG tablet, Take 500-1,000 mg by mouth every 6 (six) hours as needed., Disp: , Rfl:    Potassium Chloride  (K+ POTASSIUM PO), Take by mouth. Over the counter, per pt takes for leg cramps, Disp: , Rfl:    pravastatin  (PRAVACHOL ) 20 MG tablet, Take 1 tablet (20 mg total) by mouth daily., Disp: 90 tablet, Rfl: 1   No Known Allergies   Review of Systems   There were no vitals filed for this visit. There is no height or weight on file to calculate BMI.  Wt Readings from Last 3 Encounters:  08/30/23 253 lb (114.8 kg)  06/20/23 245 lb (111.1 kg)  04/07/23 247 lb 12.8 oz (112.4 kg)    The 10-year ASCVD risk score (Arnett DK, et al., 2019) is: 23.2%   Values used to calculate the score:     Age: 74 years     Sex: Male     Is Non-Hispanic African American: Yes     Diabetic: No     Tobacco smoker: Yes     Systolic Blood Pressure: 150 mmHg     Is BP treated: Yes  HDL Cholesterol: 54 mg/dL     Total Cholesterol: 264 mg/dL  Objective:  Physical Exam      Assessment And Plan:  Essential hypertension  Mixed hyperlipidemia    No follow-ups on file.  Patient was given opportunity to ask questions. Patient verbalized understanding of the plan and was able to repeat key elements of the plan. All questions were answered to their satisfaction.    [name], FNP, have reviewed all documentation for this visit. The documentation on 10/05/23 for the exam, diagnosis, procedures, and orders are all accurate and complete.   IF YOU HAVE BEEN REFERRED TO A SPECIALIST, IT MAY TAKE 1-2 WEEKS TO SCHEDULE/PROCESS THE REFERRAL. IF YOU HAVE NOT HEARD FROM US /SPECIALIST IN TWO WEEKS, PLEASE GIVE US  A CALL AT [phone] X [phone].

## 2023-10-06 ENCOUNTER — Ambulatory Visit: Payer: BLUE CROSS/BLUE SHIELD | Admitting: Nurse Practitioner

## 2023-10-09 IMAGING — NM NM BONE 3 PHASE
2 series · 12 of 12 positions shown · non-contrast
Comparison: None

Correlation: LEFT knee radiographs 05/06/2020

CLINICAL DATA: Chronic LEFT knee pain, LEFT knee replacement
April 2020

EXAM:
NUCLEAR MEDICINE 3-PHASE BONE SCAN
TECHNIQUE: Radionuclide angiographic images, immediate static blood pool
images, and 3-hour delayed static images were obtained of the knees
after intravenous injection of radiopharmaceutical.
RADIOPHARMACEUTICALS:  20.7 mCi Vc-BBm MDP IV

[Series 1: flow · 4.14mm/px · 6 of 40 frames shown (1 of 2)]
[frame 4/40  full-range]
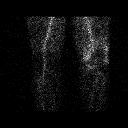
[frame 10/40  full-range]
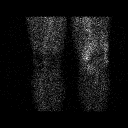
[frame 17/40  full-range]
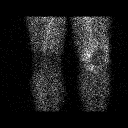
[frame 24/40  full-range]
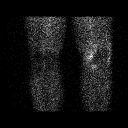
[frame 30/40  full-range]
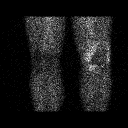
[frame 37/40  full-range]
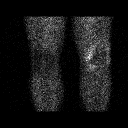

[Series 1: flow · 4.14mm/px · 6 of 40 frames shown (2 of 2)]
[frame 4/40  full-range]
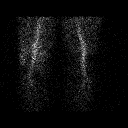
[frame 10/40  full-range]
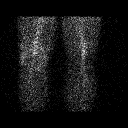
[frame 17/40  full-range]
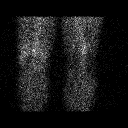
[frame 24/40  full-range]
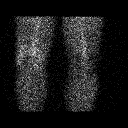
[frame 30/40  full-range]
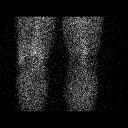
[frame 37/40  full-range]
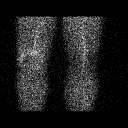

[12 of 12 positions shown; findings below may reference images not displayed]

FINDINGS: Vascular phase: Increased blood flow to periarticular region of LEFT
knee. Normal blood flow RIGHT knee.

Blood pool phase: Increased blood pool surrounding LEFT knee. Normal
blood pool RIGHT knee.

Delayed phase: No abnormal tracer uptake at RIGHT knee. Photopenic
defect at LEFT knee from prosthetic components. Focal abnormal
delayed uptake of tracer at the medial LEFT tibial plateau adjacent
to the prosthesis consistent with aseptic loosening, though
infection is not completely excluded.
IMPRESSION: Abnormal increased blood flow and blood pool of tracer at the LEFT
knee consistent with hyperemia and synovitis.

Focal abnormal tracer uptake at medial LEFT tibial plateau adjacent
to the tibial component of the LEFT knee prosthesis most consistent
with aseptic loosening.

## 2023-10-18 ENCOUNTER — Telehealth: Payer: Self-pay | Admitting: Behavioral Health

## 2023-10-18 ENCOUNTER — Other Ambulatory Visit: Payer: Self-pay

## 2023-10-18 DIAGNOSIS — F900 Attention-deficit hyperactivity disorder, predominantly inattentive type: Secondary | ICD-10-CM

## 2023-10-18 NOTE — Telephone Encounter (Signed)
 Pended Adderall, #42, to get to his appt, has had 2 no shows.

## 2023-10-18 NOTE — Telephone Encounter (Signed)
 Wife called and I told her that he had to have an appointment that he missed the one in may. He is scheduled for 11/04/23. He needs his adderall 15 mg. Pharmacy is [facility] on [address]

## 2023-10-19 MED ORDER — AMPHETAMINE-DEXTROAMPHETAMINE 15 MG PO TABS: 15.0000 mg | ORAL_TABLET | Freq: Three times a day (TID) | ORAL | 0 refills | Status: DC

## 2023-10-31 ENCOUNTER — Ambulatory Visit: Admitting: Nurse Practitioner

## 2023-11-04 ENCOUNTER — Encounter: Payer: Self-pay | Admitting: Behavioral Health

## 2023-11-04 ENCOUNTER — Ambulatory Visit: Admitting: Behavioral Health

## 2023-11-04 DIAGNOSIS — F900 Attention-deficit hyperactivity disorder, predominantly inattentive type: Secondary | ICD-10-CM

## 2023-11-04 MED ORDER — ESCITALOPRAM OXALATE 20 MG PO TABS: 20.0000 mg | ORAL_TABLET | Freq: Every day | ORAL | 1 refills | Status: DC

## 2023-11-04 MED ORDER — AMPHETAMINE-DEXTROAMPHETAMINE 15 MG PO TABS: 15.0000 mg | ORAL_TABLET | Freq: Three times a day (TID) | ORAL | 0 refills | Status: DC

## 2023-11-04 NOTE — Progress Notes (Signed)
 [facility]  Patient ID: Steven Gibson,  MRN: [id number]  PCP: [name], FNP  Date of Evaluation: 11/04/2023 Time spent:30 minutes  Chief Complaint:  Chief Complaint   Depression; Anxiety; Follow-up; Medication Refill; Patient Education     HISTORY/CURRENT STATUS: HPI Steven Gibson, 53 year old African-American male presents to this office for follow up and medication management. No social changes this visit. He is very happy with his care. Says that his medications are working fine for now and that he would only need refills.  He says he is currently stable and not requesting any changes to his medication regimen. Started new job with [name].  He reports anxiety at 2/10, and depression at 2/10.    He is sleeping 7 to 8 hours per night.  He denies any history of mania or psychosis.  No history of auditory or visual hallucinations.  No SI or HI.   Past psychiatric medication trials: Wellbutrin Vyvanse        Individual Medical History/ Review of Systems: Changes? :No   Allergies: Patient has no known allergies.  Current Medications:  Current Outpatient Medications:    amLODipine  (NORVASC ) 10 MG tablet, Take 1 tablet (10 mg total) by mouth daily., Disp: 90 tablet, Rfl: 1   [START ON 11/18/2023] amphetamine -dextroamphetamine  (ADDERALL) 15 MG tablet, Take 1 tablet by mouth 3 (three) times daily., Disp: 90 tablet, Rfl: 0   Azelastine  HCl 137 MCG/SPRAY SOLN, Place 2 sprays into the nose daily., Disp: 30 mL, Rfl: 2   clobetasol  cream (TEMOVATE ) 0.05 %, Apply 1 Application topically 2 (two) times daily., Disp: 90 g, Rfl: 0   escitalopram  (LEXAPRO ) 20 MG tablet, Take 1 tablet (20 mg total) by mouth daily., Disp: 90 tablet, Rfl: 1   hydrALAZINE  (APRESOLINE ) 25 MG tablet, TAKE 1 TABLET TWICE DAILY, AM & NIGHT, Disp: 180 tablet, Rfl: 1   lisinopril -hydrochlorothiazide  (ZESTORETIC ) 20-25 MG tablet, Take 1 tablet by mouth daily., Disp: 90 tablet, Rfl: 1   meloxicam   (MOBIC ) 15 MG tablet, Take 1 tablet (15 mg total) by mouth daily., Disp: 90 tablet, Rfl: 1   methimazole  (TAPAZOLE ) 5 MG tablet, Take 1 tablet (5 mg total) by mouth as directed. 1 tablet Monday through Saturday and none on Sundays, Disp: 78 tablet, Rfl: 3   methocarbamol  (ROBAXIN ) 500 MG tablet, Take 500-1,000 mg by mouth every 6 (six) hours as needed., Disp: , Rfl:    Potassium Chloride  (K+ POTASSIUM PO), Take by mouth. Over the counter, per pt takes for leg cramps, Disp: , Rfl:    pravastatin  (PRAVACHOL ) 20 MG tablet, Take 1 tablet (20 mg total) by mouth daily., Disp: 90 tablet, Rfl: 1 Medication Side Effects: none  Family Medical/ Social History: Changes? No  MENTAL HEALTH EXAM:  There were no vitals taken for this visit.There is no height or weight on file to calculate BMI.  General Appearance: Casual, Neat, and Well Groomed  Eye Contact:  Good  Speech:  Clear and Coherent  Volume:  Normal  Mood:  NA  Affect:  Appropriate  Thought Process:  Coherent  Orientation:  Full (Time, Place, and Person)  Thought Content: Logical   Suicidal Thoughts:  No  Homicidal Thoughts:  No  Memory:  WNL  Judgement:  Good  Insight:  Good  Psychomotor Activity:  NA  Concentration:  Concentration: Good  Recall:  Good  Fund of Knowledge: Good  Language: Good  Assets:  Desire for Improvement  ADL's:  Intact  Cognition: WNL  Prognosis:  Good    DIAGNOSES:    ICD-10-CM   1. Attention deficit hyperactivity disorder (ADHD), predominantly inattentive type  F90.0 escitalopram  (LEXAPRO ) 20 MG tablet    amphetamine -dextroamphetamine  (ADDERALL) 15 MG tablet      Receiving Psychotherapy: No    RECOMMENDATIONS:  Greater than 50% of face to face time with patient was spent on counseling and coordination of care.  No changes this visit. He continues to enjoy very good stability.  No changes or adjustments to medication indicated at this time. Wife assist in organizing and requesting refills for him. No  concerns this visit.  Reinforced importance of monitoring BP regularly.    We agreed to:   Will continue Adderall 15 mg three time daily as needed To continue Lexapro  20 mg daily To report side effects or worsening symtoms To follow up in 6 months  to reassess Provided emergency contact information Reviewed PDMP    Lincoln Renshaw, NP

## 2023-11-10 ENCOUNTER — Ambulatory Visit: Admitting: Nurse Practitioner

## 2023-11-10 VITALS — BP 140/80 | HR 102 | Temp 97.8°F | Ht 74.0 in | Wt 256.0 lb

## 2023-11-10 DIAGNOSIS — E782 Mixed hyperlipidemia: Secondary | ICD-10-CM

## 2023-11-10 DIAGNOSIS — Z79899 Other long term (current) drug therapy: Secondary | ICD-10-CM | POA: Diagnosis not present

## 2023-11-10 DIAGNOSIS — Z6832 Body mass index (BMI) 32.0-32.9, adult: Secondary | ICD-10-CM

## 2023-11-10 DIAGNOSIS — L309 Dermatitis, unspecified: Secondary | ICD-10-CM

## 2023-11-10 DIAGNOSIS — E66811 Obesity, class 1: Secondary | ICD-10-CM

## 2023-11-10 DIAGNOSIS — I10 Essential (primary) hypertension: Secondary | ICD-10-CM | POA: Diagnosis not present

## 2023-11-10 DIAGNOSIS — R7303 Prediabetes: Secondary | ICD-10-CM | POA: Diagnosis not present

## 2023-11-10 DIAGNOSIS — E6609 Other obesity due to excess calories: Secondary | ICD-10-CM

## 2023-11-10 DIAGNOSIS — Z8639 Personal history of other endocrine, nutritional and metabolic disease: Secondary | ICD-10-CM

## 2023-11-10 MED ORDER — MOMETASONE FUROATE 0.1 % EX OINT: TOPICAL_OINTMENT | Freq: Every day | CUTANEOUS | 2 refills | Status: DC

## 2023-11-10 NOTE — Patient Instructions (Signed)
 [facility] Address: [address], [place], [place] [address] Phone: [phone]  Managing Pain Without Opioids Opioids are strong medicines used to treat moderate to severe pain. For some people, especially those who have long-term (chronic) pain, opioids may not be the best choice for pain management due to: Side effects like nausea, constipation, and sleepiness. The risk of addiction (opioid use disorder). The longer you take opioids, the greater your risk of addiction. Pain that lasts for more than 3 months is called chronic pain. Managing chronic pain usually requires more than one approach and is often provided by a team of health care providers working together (multidisciplinary approach). Pain management may be done at a pain management center or pain clinic. How to manage pain without the use of opioids Use non-opioid medicines Non-opioid medicines for pain may include: Over-the-counter or prescription non-steroidal anti-inflammatory drugs (NSAIDs). These may be the first medicines used for pain. They work well for muscle and bone pain, and they reduce swelling. Acetaminophen . This over-the-counter medicine may work well for milder pain but not swelling. Antidepressants. These may be used to treat chronic pain. A certain type of antidepressant (tricyclics) is often used. These medicines are given in lower doses for pain than when used for depression. Anticonvulsants. These are usually used to treat seizures but may also reduce nerve (neuropathic) pain. Muscle relaxants. These relieve pain caused by sudden muscle tightening (spasms). You may also use a pain medicine that is applied to the skin as a patch, cream, or gel (topical analgesic), such as a numbing medicine. These may cause fewer side effects than medicines taken by mouth. Do certain therapies as directed Some therapies can help with pain management. They include: Physical therapy. You will do exercises to  gain strength and flexibility. A physical therapist may teach you exercises to move and stretch parts of your body that are weak, stiff, or painful. You can learn these exercises at physical therapy visits and practice them at home. Physical therapy may also involve: Massage. Heat wraps or applying heat or cold to affected areas. Electrical signals that interrupt pain signals (transcutaneous electrical nerve stimulation, TENS). Weak lasers that reduce pain and swelling (low-level laser therapy). Signals from your body that help you learn to regulate pain (biofeedback). Occupational therapy. This helps you to learn ways to function at home and work with less pain. Recreational therapy. This involves trying new activities or hobbies, such as a physical activity or drawing. Mental health therapy, including: Cognitive behavioral therapy (CBT). This helps you learn coping skills for dealing with pain. Acceptance and commitment therapy (ACT) to change the way you think and react to pain. Relaxation therapies, including muscle relaxation exercises and mindfulness-based stress reduction. Pain management counseling. This may be individual, family, or group counseling.  Receive medical treatments Medical treatments for pain management include: Nerve block injections. These may include a pain blocker and anti-inflammatory medicines. You may have injections: Near the spine to relieve chronic back or neck pain. Into joints to relieve back or joint pain. Into nerve areas that supply a painful area to relieve body pain. Into muscles (trigger point injections) to relieve some painful muscle conditions. A medical device placed near your spine to help block pain signals and relieve nerve pain or chronic back pain (spinal cord stimulation device). Acupuncture. Follow these instructions at home Medicines Take over-the-counter and prescription medicines only as told by your health care provider. If you are  taking pain medicine, ask your health care  providers about possible side effects to watch out for. Do not drive or use heavy machinery while taking prescription opioid pain medicine. Lifestyle  Do not use drugs or alcohol to reduce pain. If you drink alcohol, limit how much you have to: 0-1 drink a day for women who are not pregnant. 0-2 drinks a day for men. Know how much alcohol is in a drink. In the [place] one drink equals one 12 oz bottle of beer (355 mL), one 5 oz glass of wine (148 mL), or one 1 oz glass of hard liquor (44 mL). Do not use any products that contain nicotine or tobacco. These products include cigarettes, chewing tobacco, and vaping devices, such as e-cigarettes. If you need help quitting, ask your health care provider. Eat a healthy diet and maintain a healthy weight. Poor diet and excess weight may make pain worse. Eat foods that are high in fiber. These include fresh fruits and vegetables, whole grains, and beans. Limit foods that are high in fat and processed sugars, such as fried and sweet foods. Exercise regularly. Exercise lowers stress and may help relieve pain. Ask your health care provider what activities and exercises are safe for you. If your health care provider approves, join an exercise class that combines movement and stress reduction. Examples include yoga and tai chi. Get enough sleep. Lack of sleep may make pain worse. Lower stress as much as possible. Practice stress reduction techniques as told by your therapist. General instructions Work with all your pain management providers to find the treatments that work best for you. You are an important member of your pain management team. There are many things you can do to reduce pain on your own. Consider joining an online or in-person support group for people who have chronic pain. Keep all follow-up visits. This is important. Where to find more information You can find more information about managing pain  without opioids from: American Academy of Pain Medicine: painmed.org Institute for Chronic Pain: instituteforchronicpain.org American Chronic Pain Association: theacpa.org Contact a health care provider if: You have side effects from pain medicine. Your pain gets worse or does not get better with treatments or home therapy. You are struggling with anxiety or depression. Summary Many types of pain can be managed without opioids. Chronic pain may respond better to pain management without opioids. Pain is best managed when you and a team of health care providers work together. Pain management without opioids may include non-opioid medicines, medical treatments, physical therapy, mental health therapy, and lifestyle changes. Tell your health care providers if your pain gets worse or is not being managed well enough. This information is not intended to replace advice given to you by your health care provider. Make sure you discuss any questions you have with your health care provider.  Document Revised: 08/13/2020 Document Reviewed: 08/13/2020 Elsevier Patient Education  2024 [employer].

## 2023-11-10 NOTE — Progress Notes (Signed)
 I,[name], CMA,acting as a Neurosurgeon for [employer], FNP.,have documented all relevant documentation on the behalf of Steven Ada, FNP,as directed by  Steven Ada, FNP while in the presence of Steven Ada, FNP.  Subjective:  Patient ID: Steven Gibson , male    DOB: 06-04-1970 , 53 y.o.   MRN: [id number]  Chief Complaint  Patient presents with   Hypertension    Patient presents today for hypertension management .Patient reports compliance with his meds.    Hyperlipidemia   ezcema    Patient reports his ezcema has been getting worse.     HPI  Patient presents today for a blood pressure    Hypertension This is a chronic problem. The current episode started more than 1 year ago. The problem is unchanged. The problem is controlled. Pertinent negatives include no anxiety, chest pain, headaches or palpitations. There are no associated agents to hypertension. Risk factors for coronary artery disease include male gender, obesity and sedentary lifestyle. Past treatments include angiotensin blockers, diuretics and calcium channel blockers. There are no compliance problems.  There is no history of angina. There is no history of chronic renal disease.  Hyperlipidemia This is a chronic problem. The current episode started more than 1 year ago. The problem is controlled. Recent lipid tests were reviewed and are normal. He has no history of chronic renal disease. Pertinent negatives include no chest pain. There are no compliance problems.  Risk factors for coronary artery disease include male sex and obesity.     Past Medical History:  Diagnosis Date   Arthritis    Arthrofibrosis of knee joint, left    post op TKA    History of Graves' disease 2008   s/p  RAI  same year , on medication for a year then stablized no meds since  (per pt followed by pcp)   Hyperlipidemia    Hypertension    followed by pcp   (in care everywhere pt had stress echo, 03-26-2016, negative for ishemia  and echo normal)   Wears glasses      Family History  Problem Relation Age of Onset   Hypertension Mother    Hypertension Father    Hypertension Brother    Cancer Paternal Grandfather    Hypertension Brother      Current Outpatient Medications:    amLODipine  (NORVASC ) 10 MG tablet, Take 1 tablet (10 mg total) by mouth daily., Disp: 90 tablet, Rfl: 1   Azelastine  HCl 137 MCG/SPRAY SOLN, Place 2 sprays into the nose daily., Disp: 30 mL, Rfl: 2   escitalopram  (LEXAPRO ) 20 MG tablet, Take 1 tablet (20 mg total) by mouth daily., Disp: 90 tablet, Rfl: 1   hydrALAZINE  (APRESOLINE ) 25 MG tablet, TAKE 1 TABLET TWICE DAILY, AM & NIGHT, Disp: 180 tablet, Rfl: 1   lisinopril -hydrochlorothiazide  (ZESTORETIC ) 20-25 MG tablet, Take 1 tablet by mouth daily., Disp: 90 tablet, Rfl: 1   meloxicam  (MOBIC ) 15 MG tablet, Take 1 tablet (15 mg total) by mouth daily., Disp: 90 tablet, Rfl: 1   methimazole  (TAPAZOLE ) 5 MG tablet, Take 1 tablet (5 mg total) by mouth as directed. 1 tablet Monday through Saturday and none on Sundays, Disp: 78 tablet, Rfl: 3   methocarbamol  (ROBAXIN ) 500 MG tablet, Take 500-1,000 mg by mouth every 6 (six) hours as needed., Disp: , Rfl:    mometasone  (ELOCON ) 0.1 % ointment, Apply topically daily., Disp: 45 g, Rfl: 2   Potassium Chloride  (K+ POTASSIUM PO), Take by mouth. Over  the counter, per pt takes for leg cramps, Disp: , Rfl:    pravastatin  (PRAVACHOL ) 20 MG tablet, Take 1 tablet (20 mg total) by mouth daily., Disp: 90 tablet, Rfl: 1   amphetamine -dextroamphetamine  (ADDERALL) 15 MG tablet, Take 1 tablet by mouth 3 (three) times daily., Disp: 90 tablet, Rfl: 0   No Known Allergies   Review of Systems  Constitutional: Negative.   Eyes: Negative.   Respiratory: Negative.    Cardiovascular: Negative.  Negative for chest pain, palpitations and leg swelling.  Gastrointestinal: Negative.   Musculoskeletal: Negative.   Skin: Negative.   Neurological: Negative.  Negative for  headaches.  Psychiatric/Behavioral: Negative.       Today's Vitals   11/10/23 1547  BP: (!) 140/80  Pulse: (!) 102  Temp: 97.8 F (36.6 C)  TempSrc: Oral  Weight: 256 lb (116.1 kg)  Height: 6' 2 (1.88 m)  PainSc: 0-No pain   Body mass index is 32.87 kg/m.  Wt Readings from Last 3 Encounters:  11/10/23 256 lb (116.1 kg)  08/30/23 253 lb (114.8 kg)  06/20/23 245 lb (111.1 kg)     Objective:  Physical Exam Vitals and nursing note reviewed.  Constitutional:      General: He is not in acute distress.    Appearance: Normal appearance. He is obese.  Cardiovascular:     Rate and Rhythm: Normal rate and regular rhythm.     Pulses: Normal pulses.     Heart sounds: Normal heart sounds. No murmur heard. Pulmonary:     Effort: Pulmonary effort is normal. No respiratory distress.     Breath sounds: Normal breath sounds. No wheezing.  Skin:    General: Skin is warm and dry.     Capillary Refill: Capillary refill takes less than 2 seconds.  Neurological:     General: No focal deficit present.     Mental Status: He is alert and oriented to person, place, and time.     Cranial Nerves: No cranial nerve deficit.     Motor: No weakness.  Psychiatric:        Mood and Affect: Mood normal.        Behavior: Behavior normal.        Thought Content: Thought content normal.         Assessment And Plan:  Essential hypertension Assessment & Plan: Blood pressure is slightly elevated, encouraged to limit his salts. Continue current medications.    Orders: -     CMP14+EGFR  Mixed hyperlipidemia Assessment & Plan: Continue taking statin, tolerating well encouraged to be adherent to taking daily.  Orders: -     Lipid panel  Prediabetes Assessment & Plan: HgbA1c is stable, continue focusing on healthy diet   Orders: -     Hemoglobin A1c  H/O Graves' disease Assessment & Plan: Continue f/u with Endocrinology   Class 1 obesity due to excess calories with serious comorbidity  and body mass index (BMI) of 32.0 to 32.9 in adult  Other long term (current) drug therapy -     CBC  Other orders -     Mometasone  Furoate; Apply topically daily.  Dispense: 45 g; Refill: 2    Return if symptoms worsen or fail to improve.  Patient was given opportunity to ask questions. Patient verbalized understanding of the plan and was able to repeat key elements of the plan. All questions were answered to their satisfaction.   LILLETTE Steven Ada, FNP, have reviewed all documentation for this visit. The  documentation on 11/10/23 for the exam, diagnosis, procedures, and orders are all accurate and complete.    IF YOU HAVE BEEN REFERRED TO A SPECIALIST, IT MAY TAKE 1-2 WEEKS TO SCHEDULE/PROCESS THE REFERRAL. IF YOU HAVE NOT HEARD FROM US /SPECIALIST IN TWO WEEKS, PLEASE GIVE US  A CALL AT [phone] X [phone].

## 2023-11-11 ENCOUNTER — Other Ambulatory Visit: Payer: Self-pay | Admitting: Nurse Practitioner

## 2023-11-11 LAB — CMP14+EGFR
ALT: 34 IU/L (ref 0–44)
AST: 40 IU/L (ref 0–40)
Albumin: 4.6 g/dL (ref 3.8–4.9)
Alkaline Phosphatase: 142 IU/L — ABNORMAL HIGH (ref 44–121)
BUN/Creatinine Ratio: 9 (ref 9–20)
BUN: 9 mg/dL (ref 6–24)
Bilirubin Total: 0.3 mg/dL (ref 0.0–1.2)
CO2: 21 mmol/L (ref 20–29)
Calcium: 9.8 mg/dL (ref 8.7–10.2)
Chloride: 102 mmol/L (ref 96–106)
Creatinine, Ser: 1.04 mg/dL (ref 0.76–1.27)
Globulin, Total: 2.7 g/dL (ref 1.5–4.5)
Glucose: 102 mg/dL — ABNORMAL HIGH (ref 70–99)
Potassium: 4.1 mmol/L (ref 3.5–5.2)
Sodium: 143 mmol/L (ref 134–144)
Total Protein: 7.3 g/dL (ref 6.0–8.5)
eGFR: 86 mL/min/{1.73_m2} (ref >59)

## 2023-11-11 LAB — HEMOGLOBIN A1C
Est. average glucose Bld gHb Est-mCnc: 131 mg/dL
Hgb A1c MFr Bld: 6.2 % — ABNORMAL HIGH (ref 4.8–5.6)

## 2023-11-11 LAB — LIPID PANEL
Chol/HDL Ratio: 3.6 ratio (ref 0.0–5.0)
Cholesterol, Total: 222 mg/dL — ABNORMAL HIGH (ref 100–199)
HDL: 61 mg/dL (ref >39)
LDL Chol Calc (NIH): 140 mg/dL — ABNORMAL HIGH (ref 0–99)
Triglycerides: 121 mg/dL (ref 0–149)
VLDL Cholesterol Cal: 21 mg/dL (ref 5–40)

## 2023-11-11 LAB — CBC
Hematocrit: 43.8 % (ref 37.5–51.0)
Hemoglobin: 14.7 g/dL (ref 13.0–17.7)
MCH: 28.8 pg (ref 26.6–33.0)
MCHC: 33.6 g/dL (ref 31.5–35.7)
MCV: 86 fL (ref 79–97)
Platelets: 297 10*3/uL (ref 150–450)
RBC: 5.1 x10E6/uL (ref 4.14–5.80)
RDW: 13.7 % (ref 11.6–15.4)
WBC: 6 10*3/uL (ref 3.4–10.8)

## 2023-11-16 ENCOUNTER — Other Ambulatory Visit: Payer: Self-pay

## 2023-11-16 ENCOUNTER — Telehealth: Payer: Self-pay | Admitting: Behavioral Health

## 2023-11-16 DIAGNOSIS — F900 Attention-deficit hyperactivity disorder, predominantly inattentive type: Secondary | ICD-10-CM

## 2023-11-16 MED ORDER — AMPHETAMINE-DEXTROAMPHETAMINE 15 MG PO TABS: 15.0000 mg | ORAL_TABLET | Freq: Three times a day (TID) | ORAL | 0 refills | Status: DC

## 2023-11-16 NOTE — Telephone Encounter (Signed)
Has been pended

## 2023-11-16 NOTE — Telephone Encounter (Signed)
 Pt wife called in stating he is out of meds and can't wait until the 4th and wants to see if he could get early refill. I advised pt a msg was already sent and I would relay concern.

## 2023-11-16 NOTE — Telephone Encounter (Signed)
 Pended. He has Rx for start date of 7/4, but he got a short fill last month due to noncompliance with FU.

## 2023-11-16 NOTE — Telephone Encounter (Signed)
 Wife lvm that ed is out of his adderall and needs to have it filled early.  Please call his wife at [phone]

## 2023-11-20 ENCOUNTER — Ambulatory Visit: Payer: Self-pay | Admitting: Nurse Practitioner

## 2023-11-20 DIAGNOSIS — R748 Abnormal levels of other serum enzymes: Secondary | ICD-10-CM

## 2023-11-20 NOTE — Assessment & Plan Note (Signed)
 Continue taking statin, tolerating well encouraged to be adherent to taking daily.

## 2023-11-20 NOTE — Assessment & Plan Note (Signed)
 Blood pressure is slightly elevated, encouraged to limit his salts. Continue current medications.

## 2023-11-20 NOTE — Assessment & Plan Note (Signed)
 He is encouraged to strive for BMI less than 30 to decrease cardiac risk. Advised to aim for at least 150 minutes of exercise per week.

## 2023-11-20 NOTE — Assessment & Plan Note (Signed)
 HgbA1c is stable, continue focusing on healthy diet.

## 2023-11-20 NOTE — Assessment & Plan Note (Signed)
 Will switch steroid cream to elocon  to see if more effective. He is awaiting his appt with Dermatology

## 2023-11-20 NOTE — Assessment & Plan Note (Signed)
Continue f/u with Endocrinology.

## 2023-12-06 ENCOUNTER — Emergency Department (HOSPITAL_BASED_OUTPATIENT_CLINIC_OR_DEPARTMENT_OTHER)

## 2023-12-06 ENCOUNTER — Emergency Department (HOSPITAL_BASED_OUTPATIENT_CLINIC_OR_DEPARTMENT_OTHER)
Admission: EM | Admit: 2023-12-06 | Discharge: 2023-12-06 | Disposition: A | Discharge status: Home / Self Care | Source: Ambulatory Visit | Attending: Emergency Medicine | Admitting: Emergency Medicine

## 2023-12-06 ENCOUNTER — Encounter (HOSPITAL_BASED_OUTPATIENT_CLINIC_OR_DEPARTMENT_OTHER): Payer: Self-pay | Admitting: *Deleted

## 2023-12-06 ENCOUNTER — Other Ambulatory Visit: Payer: Self-pay

## 2023-12-06 ENCOUNTER — Ambulatory Visit
Admission: EM | Admit: 2023-12-06 | Discharge: 2023-12-06 | Disposition: A | Discharge status: Home / Self Care | Attending: Family Medicine | Admitting: Family Medicine

## 2023-12-06 DIAGNOSIS — I10 Essential (primary) hypertension: Secondary | ICD-10-CM | POA: Insufficient documentation

## 2023-12-06 DIAGNOSIS — M79661 Pain in right lower leg: Secondary | ICD-10-CM | POA: Diagnosis not present

## 2023-12-06 DIAGNOSIS — Z79899 Other long term (current) drug therapy: Secondary | ICD-10-CM | POA: Diagnosis not present

## 2023-12-06 DIAGNOSIS — M7121 Synovial cyst of popliteal space [Baker], right knee: Secondary | ICD-10-CM | POA: Insufficient documentation

## 2023-12-06 DIAGNOSIS — M7989 Other specified soft tissue disorders: Secondary | ICD-10-CM

## 2023-12-06 DIAGNOSIS — R2241 Localized swelling, mass and lump, right lower limb: Secondary | ICD-10-CM | POA: Diagnosis not present

## 2023-12-06 DIAGNOSIS — M79609 Pain in unspecified limb: Secondary | ICD-10-CM

## 2023-12-06 DIAGNOSIS — M79604 Pain in right leg: Secondary | ICD-10-CM | POA: Diagnosis not present

## 2023-12-06 NOTE — Discharge Instructions (Signed)
 Please go to the emerged team for further evaluation of your symptoms

## 2023-12-06 NOTE — ED Triage Notes (Signed)
 Pt presents to UC for c/o right leg swelling and pain from the knee down x2 days. Pain is mostly in the calf muscle. Uses ice and ibuprofen w/ minimal relief.

## 2023-12-06 NOTE — ED Provider Notes (Signed)
 [facility]    CSN: [id number] Arrival date & time: 12/06/23  1527      History   Chief Complaint No chief complaint on file.   HPI Steven Gibson is a 54 y.o. male with a past medical history of Graves' disease, hyperlipidemia, hypertension, arthritis presents for evaluation of right lower extremity swelling x 2 days.  Patient denies any injury.  Reports swelling is worse throughout the day and extends from his knee to his ankle.  He is also having right posterior calf pain/soreness.  Denies any redness warmth, chest pain, shortness of breath, orthopnea.  Denies any long distance travel.  No history of clotting disorders or DVTs.  He has been trying ice and ibuprofen without improvement.  No other concerns at this time  HPI  Past Medical History:  Diagnosis Date   Arthritis    Arthrofibrosis of knee joint, left    post op TKA    History of Graves' disease 2008   s/p  RAI  same year , on medication for a year then stablized no meds since  (per pt followed by pcp)   Hyperlipidemia    Hypertension    followed by pcp   (in care everywhere pt had stress echo, 03-26-2016, negative for ishemia and echo normal)   Wears glasses     Patient Active Problem List   Diagnosis Date Noted   Class 1 obesity due to excess calories with serious comorbidity and body mass index (BMI) of 32.0 to 32.9 in adult 08/30/2023   Seasonal allergies 08/30/2023   Acute cough 08/30/2023   Rash and nonspecific skin eruption 04/13/2023   Encounter for annual health examination 04/07/2023   COVID-19 vaccination declined 04/07/2023   Influenza vaccination declined 04/07/2023   Herpes zoster vaccination declined 04/07/2023   Encounter for prostate cancer screening 04/07/2023   Impacted cerumen of right ear 04/07/2023   Arthritis 09/01/2022   Prediabetes 05/03/2022   Class 1 obesity due to excess calories with serious comorbidity and body mass index (BMI) of 31.0 to 31.9 in adult 05/03/2022    Complication associated with orthopedic device (HCC) 02/10/2022   Mood disorder (HCC) 08/18/2021   Osteoarthritis of left knee 06/04/2021   S/P total knee arthroplasty, left 05/06/2020   Eczema 04/17/2019   Mixed hyperlipidemia 08/02/2018   Numbness and tingling in both hands 08/02/2018   Essential hypertension 07/24/2018   H/O Graves' disease 07/24/2018   Attention deficit disorder 07/24/2018   Dyslipidemia 03/26/2016   Family history of premature CAD 03/26/2016   Tobacco abuse 08/13/2014   GERD without esophagitis 08/13/2014   Lumbar herniated disc 08/13/2014    Past Surgical History:  Procedure Laterality Date   ANTERIOR CRUCIATE LIGAMENT REPAIR Left 1990S   KNEE ARTHROSCOPY W/ MEDIAL COLLATERAL LIGAMENT (MCL) REPAIR Left 1990s   KNEE ARTHROSCOPY W/ MENISCAL REPAIR Left 1990s   KNEE CLOSED REDUCTION Left 10/21/2020   Procedure: CLOSED MANIPULATION LEFT KNEE;  Surgeon: Josefina Chew, MD;  Location: [facility] [place];  Service: Orthopedics;  Laterality: Left;   LIPOMA EXCISION     LEF SHOULDER AREA   TOTAL KNEE ARTHROPLASTY Left 05/06/2020   Procedure: TOTAL KNEE ARTHROPLASTY;  Surgeon: Josefina Chew, MD;  Location: [facility];  Service: Orthopedics;  Laterality: Left;   UMBILICAL HERNIA REPAIR  07-17-2015  @[email or web address]    AND RIGHT ABDOMINAL WALL EXCISION LIPOMA       Home Medications    Prior to Admission medications   Medication Sig Start Date  End Date Taking? Authorizing Provider  amLODipine  (NORVASC ) 10 MG tablet Take 1 tablet (10 mg total) by mouth daily. 04/07/23  Yes Georgina Speaks, FNP  amphetamine -dextroamphetamine  (ADDERALL) 15 MG tablet Take 1 tablet by mouth 3 (three) times daily. 11/16/23 12/16/23 Yes Hurst, Verneita T, PA-C  escitalopram  (LEXAPRO ) 20 MG tablet Take 1 tablet (20 mg total) by mouth daily. 11/04/23  Yes White, Redell A, NP  hydrALAZINE  (APRESOLINE ) 25 MG tablet TAKE 1 TABLET TWICE DAILY, AM & NIGHT 04/07/23  Yes Moore, Janece, FNP   lisinopril -hydrochlorothiazide  (ZESTORETIC ) 20-25 MG tablet Take 1 tablet by mouth daily. 04/07/23  Yes Georgina Speaks, FNP  methimazole  (TAPAZOLE ) 5 MG tablet Take 1 tablet (5 mg total) by mouth as directed. 1 tablet Monday through Saturday and none on Sundays 06/21/23  Yes Shamleffer, Ibtehal Jaralla, MD  methocarbamol  (ROBAXIN ) 500 MG tablet Take 500-1,000 mg by mouth every 6 (six) hours as needed. 06/08/23  Yes [provider]  Potassium Chloride  (K+ POTASSIUM PO) Take by mouth. Over the counter, per pt takes for leg cramps   Yes [provider]  pravastatin  (PRAVACHOL ) 20 MG tablet Take 1 tablet (20 mg total) by mouth daily. 04/07/23  Yes Georgina Speaks, FNP  Azelastine  HCl 137 MCG/SPRAY SOLN Place 2 sprays into the nose daily. 08/30/23   Georgina Speaks, FNP  meloxicam  (MOBIC ) 15 MG tablet Take 1 tablet (15 mg total) by mouth daily. 04/07/23   Georgina Speaks, FNP  mometasone  (ELOCON ) 0.1 % ointment Apply topically daily. 11/10/23   Georgina Speaks, FNP    Family History Family History  Problem Relation Age of Onset   Hypertension Mother    Hypertension Father    Hypertension Brother    Cancer Paternal Grandfather    Hypertension Brother     Social History Social History   Tobacco Use   Smoking status: Every Day    Current packs/day: 0.50    Average packs/day: 0.5 packs/day for 25.0 years (12.5 ttl pk-yrs)    Types: Cigarettes   Smokeless tobacco: Former    Quit date: 10/20/1988  Vaping Use   Vaping status: Never Used  Substance Use Topics   Alcohol use: Yes    Comment: occasional   Drug use: Not Currently    Types: Cocaine     Allergies   Patient has no known allergies.   Review of Systems Review of Systems  Skin:        Right lower extremity swelling and calf pain     Physical Exam Triage Vital Signs ED Triage Vitals  Encounter Vitals Group     BP 12/06/23 1537 133/89     Girls Systolic BP Percentile --      Girls Diastolic BP Percentile --       Boys Systolic BP Percentile --      Boys Diastolic BP Percentile --      Pulse Rate 12/06/23 1537 88     Resp 12/06/23 1537 16     Temp 12/06/23 1537 98.1 F (36.7 C)     Temp Source 12/06/23 1537 Oral     SpO2 12/06/23 1537 98 %     Weight --      Height --      Head Circumference --      Peak Flow --      Pain Score 12/06/23 1533 8     Pain Loc --      Pain Education --      Exclude from Growth Chart --  No data found.  Updated Vital Signs BP 133/89 (BP Location: Right Arm)   Pulse 88   Temp 98.1 F (36.7 C) (Oral)   Resp 16   SpO2 98%   Visual Acuity Right Eye Distance:   Left Eye Distance:   Bilateral Distance:    Right Eye Near:   Left Eye Near:    Bilateral Near:     Physical Exam Vitals and nursing note reviewed.  Constitutional:      General: He is not in acute distress.    Appearance: Normal appearance. He is not ill-appearing.  HENT:     Head: Normocephalic and atraumatic.  Eyes:     Pupils: Pupils are equal, round, and reactive to light.  Cardiovascular:     Rate and Rhythm: Normal rate.  Pulmonary:     Effort: Pulmonary effort is normal.  Musculoskeletal:     Right lower leg: Swelling and tenderness present. Edema present.     Left lower leg: Normal.     Comments: +1 pitting edema to right lower leg from knee to ankle.  There is tenderness along the posterior right calf.  Right calf measures 42.5 cm left calf measures 40 cm.  Skin:    General: Skin is warm and dry.  Neurological:     General: No focal deficit present.     Mental Status: He is alert and oriented to person, place, and time.  Psychiatric:        Mood and Affect: Mood normal.        Behavior: Behavior normal.    Clinical feature Score   0  Active cancer (treatment ongoing or within the previous six months or palliative) 0  Paralysis, paresis, or recent plaster immobilization of the lower extremities 0  Recently bedridden for more than three days or major surgery, within  four weeks                                                    0  Localized tenderness along the distribution of the deep venous system 1  Entire leg swollen 0  Calf swelling by more than 3 cm when compared to the asymptomatic leg (measured below tibial tuberosity) 0  Pitting edema (greater in the symptomatic leg) 1  Collateral superficial veins (nonvaricose) 0  Alternative diagnosis as likely or more likely than that of deep venous thrombosis 0                                                                                                                                                             Total Score 2     Interpretation  High probability  3 or greater  Moderate probability 1 or 2  Low probability 0 or less  Modification   This clinical model has been modified to take one other clinical feature into account: a previously documented deep vein thrombosis (DVT) is given the score of 1. Using this modified scoring system, DVT is either likely or unlikely, as follows:  DVT likely 2 or greater   DVT unlikely 1 or less     UC Treatments / Results  Labs (all labs ordered are listed, but only abnormal results are displayed) Labs Reviewed - No data to display  EKG   Radiology No results found.  Procedures Procedures (including critical care time)  Medications Ordered in UC Medications - No data to display  Initial Impression / Assessment and Plan / UC Course  I have reviewed the triage vital signs and the nursing notes.  Pertinent labs & imaging results that were available during my care of the patient were reviewed by me and considered in my medical decision making (see chart for details).     Reviewed exam and symptoms with patient.  Discussed limitations and abilities of urgent care.  Wells score of 2.  Given his lower extremity swelling, calf pain I advised to go to the emergency room to rule out potential DVT.  Patient is in agreement plan will go POV to the  emergency room. Final Clinical Impressions(s) / UC Diagnoses   Final diagnoses:  Localized swelling of right lower leg     Discharge Instructions      Please go to the emerged team for further evaluation of your symptoms    ED Prescriptions   None    PDMP not reviewed this encounter.   Loreda Myla SAUNDERS, NP 12/06/23 [phone]

## 2023-12-06 NOTE — ED Triage Notes (Signed)
 Pain and swelling to right leg x 2 days. Pain and tenderness worse at the calf but radiating into foot.

## 2023-12-06 NOTE — ED Provider Notes (Signed)
 [place] EMERGENCY DEPARTMENT AT [facility] Provider Note   CSN: [id number] Arrival date & time: 12/06/23  8371     Patient presents with: Leg Swelling and Leg Pain   Steven Gibson is a 53 y.o. male.    Leg Pain  Patient is a 52 year old male to the ED today with concerns for right popliteal pain and right lower leg swelling that started acutely 2 days ago.  Noted that he stepped out of his truck today and had a short episode of right calf numbness and pain.  Saw urgent care today who believed he needed to come to the emergency department for DVT rule out.  Has ambulated since.  Patient with history of HTN, Graves' disease, HLD, arthritis.  Denies headache, fever, vision changes, chest pain, shortness of breath, abdominal pain, nausea, vomiting, current numbness/weakness/tingling, decreased ROM.    Prior to Admission medications   Medication Sig Start Date End Date Taking? Authorizing Provider  amLODipine  (NORVASC ) 10 MG tablet Take 1 tablet (10 mg total) by mouth daily. 04/07/23   Moore, Janece, FNP  amphetamine -dextroamphetamine  (ADDERALL) 15 MG tablet Take 1 tablet by mouth 3 (three) times daily. 11/16/23 12/16/23  Rhys Boyer T, PA-C  Azelastine  HCl 137 MCG/SPRAY SOLN Place 2 sprays into the nose daily. 08/30/23   Georgina Speaks, FNP  escitalopram  (LEXAPRO ) 20 MG tablet Take 1 tablet (20 mg total) by mouth daily. 11/04/23   Teresa Redell LABOR, NP  hydrALAZINE  (APRESOLINE ) 25 MG tablet TAKE 1 TABLET TWICE DAILY, AM & NIGHT 04/07/23   Moore, Janece, FNP  lisinopril -hydrochlorothiazide  (ZESTORETIC ) 20-25 MG tablet Take 1 tablet by mouth daily. 04/07/23   Georgina Speaks, FNP  meloxicam  (MOBIC ) 15 MG tablet Take 1 tablet (15 mg total) by mouth daily. 04/07/23   Georgina Speaks, FNP  methimazole  (TAPAZOLE ) 5 MG tablet Take 1 tablet (5 mg total) by mouth as directed. 1 tablet Monday through Saturday and none on Sundays 06/21/23   Shamleffer, Ibtehal Jaralla, MD  methocarbamol   (ROBAXIN ) 500 MG tablet Take 500-1,000 mg by mouth every 6 (six) hours as needed. 06/08/23   [provider]  mometasone  (ELOCON ) 0.1 % ointment Apply topically daily. 11/10/23   Georgina Speaks, FNP  Potassium Chloride  (K+ POTASSIUM PO) Take by mouth. Over the counter, per pt takes for leg cramps    [provider]  pravastatin  (PRAVACHOL ) 20 MG tablet Take 1 tablet (20 mg total) by mouth daily. 04/07/23   Georgina Speaks, FNP    Allergies: Patient has no known allergies.    Review of Systems  Musculoskeletal:  Positive for arthralgias and joint swelling.  All other systems reviewed and are negative.   Updated Vital Signs BP (!) 141/97 (BP Location: Right Arm)   Pulse 90   Temp 98.6 F (37 C)   Resp 16   SpO2 100%   Physical Exam Vitals and nursing note reviewed.  Constitutional:      General: He is not in acute distress.    Appearance: Normal appearance. He is not ill-appearing, toxic-appearing or diaphoretic.  HENT:     Head: Normocephalic and atraumatic.  Eyes:     Extraocular Movements: Extraocular movements intact.     Conjunctiva/sclera: Conjunctivae normal.  Cardiovascular:     Rate and Rhythm: Normal rate and regular rhythm.     Pulses: Normal pulses.     Heart sounds: Normal heart sounds. No murmur heard.    No friction rub. No gallop.  Pulmonary:     Effort:  Pulmonary effort is normal. No respiratory distress.     Breath sounds: Normal breath sounds.  Abdominal:     General: Abdomen is flat.     Palpations: Abdomen is soft.     Tenderness: There is no abdominal tenderness.  Musculoskeletal:        General: Tenderness (Tenderness noted to right popliteal space) present. No deformity. Normal range of motion.     Right lower leg: Edema present.     Left lower leg: Edema present.  Skin:    General: Skin is warm and dry.  Neurological:     General: No focal deficit present.     Mental Status: He is alert. Mental status is at baseline.   Psychiatric:        Mood and Affect: Mood normal.     (all labs ordered are listed, but only abnormal results are displayed) Labs Reviewed - No data to display  EKG: None  Radiology: US  Venous Img Lower Unilateral Right Result Date: 12/06/2023 CLINICAL DATA:  Pain and swelling EXAM: Right LOWER EXTREMITY VENOUS DOPPLER ULTRASOUND TECHNIQUE: Gray-scale sonography with compression, as well as color and duplex ultrasound, were performed to evaluate the deep venous system(s) from the level of the common femoral vein through the popliteal and proximal calf veins. COMPARISON:  MRI 07/05/2023 FINDINGS: VENOUS Normal compressibility of the common femoral, superficial femoral, and popliteal veins, as well as the visualized calf veins. Visualized portions of profunda femoral vein and great saphenous vein unremarkable. No filling defects to suggest DVT on grayscale or color Doppler imaging. Doppler waveforms show normal direction of venous flow, normal respiratory plasticity and response to augmentation. Limited views of the contralateral common femoral vein are unremarkable. OTHER Complex collection on the medial side of the upper to mid calf measuring 7.7 x 2 x 5.5 cm. More complex area with internal echoes at the level of popliteal fossa measuring 5.5 x 2.3 x 4.4 cm. Limitations: none IMPRESSION: 1. Negative for acute right lower extremity DVT. 2. Complex collection on the medial side of the upper to mid calf measuring 7.7 x 2 x 5.5 cm. More complex area with internal echoes at the level of popliteal fossa measuring 5.5 x 2.3 x 4.4 cm. This probably corresponds to previously noted complex Baker's cyst with leakage/rupture on prior MRI but appears larger. Other differential could include soft tissue hematoma. Follow-up MRI may be obtained as clinically indicated. Electronically Signed   By: Luke Bun M.D.   On: 12/06/2023 18:32    Procedures   Medications Ordered in the ED - No data to display                                 Medical Decision Making  This patient is a 53 year old male who presents to the ED for concern of right popliteal pain and right lower leg swelling this been going on for the last 2 days.  See him by urgent care today and was told to come to the ED to rule out DVT.  On physical exam, patient is in no acute distress, afebrile, alert and orient x 4, speaking in full sentences, nontachypneic, nontachycardic.  Notably has bilateral lower leg swelling with right worse than left.  Noted to have pain to outpatient as well as mass noted to the right popliteal space.  Normal ROM at knee, ankle normal sensation bilaterally.  LCTAB, RRR, no murmur.  Unremarkable exam otherwise.  Ultrasound of right lower leg showed Baker's cyst with possible rupture.  Suspect this is the cause of patient's pain today with no signs of infection, with low suspicion was for heart failure, PE, ACS.  Will have patient use conservative management and follow-up with orthopedics as needed for reevaluation.  Will prescribe anti-inflammatories and have him rest for the next 2 days.  Patient vital signs have remained stable throughout the course of patient's time in the ED. Low suspicion for any other emergent pathology at this time. I believe this patient is safe to be discharged. Provided strict return to ER precautions. Patient expressed agreement and understanding of plan. All questions were answered.  Differential diagnoses prior to evaluation: The emergent differential diagnosis includes, but is not limited to, cyst, DVT, cellulitis, septic arthritis, arthritis, venous insufficiency, limb ischemia, compartment syndrome. This is not an exhaustive differential.   Past Medical History / Co-morbidities / Social History: Graves' disease, HLD, HTN, arthritis  Additional history: Chart reviewed. Pertinent results include:  Seen by urgent care today noting right lower extremity swelling x 2 days.  Lab  Tests/Imaging studies: I personally interpreted labs/imaging and the pertinent results include:  SABRA  Ultrasound of right lower extremity shows no DVT.  Does show complex collection measuring 7.7 x 2 x 5.5 cm corresponding to previously noted complex Baker's cyst with leakage/rupture.   I agree with the radiologist interpretation.  Medications: I have reviewed the patients home medicines and have made adjustments as needed.  Critical Interventions: None  Social Determinants of Health: Notably is good follow-up with PCP  Disposition: After consideration of the diagnostic results and the patients response to treatment, I feel that the patient would benefit from discharge and treatment as above.   emergency department workup does not suggest an emergent condition requiring admission or immediate intervention beyond what has been performed at this time. The plan is: Follow-up with PCP, follow-up with orthopedic surgery as needed, return to the ED for new or worsening symptoms, symptomatic management at home. The patient is safe for discharge and has been instructed to return immediately for worsening symptoms, change in symptoms or any other concerns.    Final diagnoses:  Synovial cyst of right popliteal space  Popliteal pain  Pain and swelling of right lower leg    ED Discharge Orders     None          Beola Terrall GORMAN DEVONNA 12/06/23 1909    Ula Prentice SAUNDERS, MD 12/06/23 [phone]

## 2023-12-06 NOTE — Discharge Instructions (Addendum)
 You were seen today for Baker's cyst.  This is likely ruptured and cause of your swelling and pain.  Recommend you follow-up with orthopedic surgery for further evaluation in the interim be sure to continue to elevate, use compression stockings, using Tylenol  and ibuprofen as pain management.  Take Tylenol  (acetominophen)  650mg  every 4-6 hours, as needed for pain or fever. Do not take more than 4,000 mg in a 24-hour period. As this may cause liver damage. While this is rare, if you begin to develop yellowing of the skin or eyes, stop taking and return to ER immediately.  Take Ibuprofen 400mg  every 4-6 hours for pain or fever, not exceeding 3,200 mg per day as more than 3,200mg  can cause Stomach irritation, dizziness, kidney issues with long-term use. Both these medications are found at any pharmacy over-the-counter.  If your symptoms do not improve, you will need to see orthopedic surgery for further evaluation as it may need to be surgically removed or drained.  Please return to the ED if need to be new or worsening symptoms which include worsening pain, worsening swelling, fever, redness to the area, numbness, weakness, tingling

## 2023-12-08 DIAGNOSIS — S83242D Other tear of medial meniscus, current injury, left knee, subsequent encounter: Secondary | ICD-10-CM | POA: Diagnosis not present

## 2023-12-19 ENCOUNTER — Ambulatory Visit: Payer: BLUE CROSS/BLUE SHIELD | Admitting: Internal Medicine

## 2023-12-19 NOTE — Progress Notes (Deleted)
 Name: Steven Gibson  MRN/ DOB: [id number], 1971/03/03    Age/ Sex: 53 y.o., male    PCP: Georgina Speaks, FNP   Reason for Endocrinology Evaluation: Graves' Disease     Date of Initial Endocrinology Evaluation: 06/17/2022    HPI: [name] is a 53 y.o. male with a past medical history of HTN and dylipidemia . The patient presented for initial endocrinology clinic visit on 06/17/2022 for consultative assistance with his Graves' Disease.    He was diagnosed with graves' disease > 20 yrs ago, he was initially on Methimazole  but  subsequently was treated with RAI ablation With normalization of TFT until he presented with hyperthyroidism in12/2023 with a suppressed TSH <0.005 uIU/mL with elevated total T4 13.3ug/dL   Pt was started on Methimazole  04/2022   Father with Yvone' disease and 2 daughters   On his initial visit with me he was on methimazole  3 times daily  TRAb negative  SUBJECTIVE:    Today (12/19/23):  Ms. Bogard is here for a follow up on hyperthyroidism.   Weight has been stable Denies local neck swelling  Denies palpitations Denies tremors  Denies constipation or diarrhea  Has left eye orbitopathy - with dryness  Has left eye burning and itching and double vision , scheduled to see opthalmology this week    Methimazole  5 mg , 1 tab Monday through Saturday, none on Sundays  HISTORY:  Past Medical History:  Past Medical History:  Diagnosis Date   Arthritis    Arthrofibrosis of knee joint, left    post op TKA    History of Graves' disease 2008   s/p  RAI  same year , on medication for a year then stablized no meds since  (per pt followed by pcp)   Hyperlipidemia    Hypertension    followed by pcp   (in care everywhere pt had stress echo, 03-26-2016, negative for ishemia and echo normal)   Wears glasses    Past Surgical History:  Past Surgical History:  Procedure Laterality Date   ANTERIOR CRUCIATE LIGAMENT REPAIR Left 1990S    KNEE ARTHROSCOPY W/ MEDIAL COLLATERAL LIGAMENT (MCL) REPAIR Left 1990s   KNEE ARTHROSCOPY W/ MENISCAL REPAIR Left 1990s   KNEE CLOSED REDUCTION Left 10/21/2020   Procedure: CLOSED MANIPULATION LEFT KNEE;  Surgeon: Josefina Chew, MD;  Location: [facility];  Service: Orthopedics;  Laterality: Left;   LIPOMA EXCISION     LEF SHOULDER AREA   TOTAL KNEE ARTHROPLASTY Left 05/06/2020   Procedure: TOTAL KNEE ARTHROPLASTY;  Surgeon: Josefina Chew, MD;  Location: [facility];  Service: Orthopedics;  Laterality: Left;   UMBILICAL HERNIA REPAIR  07-17-2015  @[email or web address]    AND RIGHT ABDOMINAL WALL EXCISION LIPOMA    Social History:  reports that he has been smoking cigarettes. He has a 12.5 pack-year smoking history. He quit smokeless tobacco use about 35 years ago. He reports current alcohol use. He reports that he does not currently use drugs after having used the following drugs: Cocaine. Family History: family history includes Cancer in his paternal grandfather; Hypertension in his brother, brother, father, and mother.   HOME MEDICATIONS: Allergies as of 12/19/2023   No Known Allergies      Medication List        Accurate as of December 19, 2023  7:02 AM. If you have any questions, ask your nurse or doctor.          amLODipine  10 MG tablet  Commonly known as: NORVASC  Take 1 tablet (10 mg total) by mouth daily.   amphetamine -dextroamphetamine  15 MG tablet Commonly known as: Adderall Take 1 tablet by mouth 3 (three) times daily.   Azelastine  HCl 137 MCG/SPRAY Soln Place 2 sprays into the nose daily.   escitalopram  20 MG tablet Commonly known as: LEXAPRO  Take 1 tablet (20 mg total) by mouth daily.   hydrALAZINE  25 MG tablet Commonly known as: APRESOLINE  TAKE 1 TABLET TWICE DAILY, AM & NIGHT   K+ POTASSIUM PO Take by mouth. Over the counter, per pt takes for leg cramps   lisinopril -hydrochlorothiazide  20-25 MG tablet Commonly known as: ZESTORETIC  Take 1 tablet by  mouth daily.   meloxicam  15 MG tablet Commonly known as: MOBIC  Take 1 tablet (15 mg total) by mouth daily.   methimazole  5 MG tablet Commonly known as: TAPAZOLE  Take 1 tablet (5 mg total) by mouth as directed. 1 tablet Monday through Saturday and none on Sundays   methocarbamol  500 MG tablet Commonly known as: ROBAXIN  Take 500-1,000 mg by mouth every 6 (six) hours as needed.   mometasone  0.1 % ointment Commonly known as: ELOCON  Apply topically daily.   pravastatin  20 MG tablet Commonly known as: PRAVACHOL  Take 1 tablet (20 mg total) by mouth daily.          REVIEW OF SYSTEMS: A comprehensive ROS was conducted with the patient and is negative except as per HPI and     OBJECTIVE:  VS: There were no vitals taken for this visit.   Wt Readings from Last 3 Encounters:  11/10/23 256 lb (116.1 kg)  08/30/23 253 lb (114.8 kg)  06/20/23 245 lb (111.1 kg)     EXAM: General: Pt appears well and is in NAD  Eyes: External eye exam with a stare, left eye  exophthalmos with chemosis .EOM intact.    Neck: General: Supple without adenopathy. Thyroid : Thyroid  size normal.  No goiter or nodules appreciated.   Lungs: Clear with good BS bilat   Heart: Auscultation: RRR.  Abdomen: soft, nontender  Extremities:  BL LE: No pretibial edema  Mental Status: Judgment, insight: Intact Orientation: Oriented to time, place, and person Mood and affect: No depression, anxiety, or agitation     DATA REVIEWED:   Latest Reference Range & Units 06/20/23 10:00  TSH 0.40 - 4.50 mIU/L 4.31  Triiodothyronine,Free,Serum 2.3 - 4.2 pg/mL 3.4  T4,Free(Direct) 0.8 - 1.8 ng/dL 1.7      Latest Reference Range & Units 04/07/23 11:09  Sodium 134 - 144 mmol/L 140  Potassium 3.5 - 5.2 mmol/L 3.9  Chloride 96 - 106 mmol/L 103  CO2 20 - 29 mmol/L 23  Glucose 70 - 99 mg/dL 892 (H)  BUN 6 - 24 mg/dL 13  Creatinine 9.23 - 8.72 mg/dL 9.01  Calcium 8.7 - 89.7 mg/dL 9.9  BUN/Creatinine Ratio 9 - 20   13  eGFR >59 mL/min/1.73 93  Alkaline Phosphatase 44 - 121 IU/L 130 (H)  Albumin 3.8 - 4.9 g/dL 4.3  AST 0 - 40 IU/L 32  ALT 0 - 44 IU/L 28  Total Protein 6.0 - 8.5 g/dL 7.2  Total Bilirubin 0.0 - 1.2 mg/dL 0.3  (H): Data is abnormally high      Latest Reference Range & Units 06/17/22 12:21  TRAB <=2.00 IU/L <1.00     ASSESSMENT/PLAN/RECOMMENDATIONS:   Hyperthyroidism:  -This is due to Graves' disease -He is s/p RAI over 2 decades ago with normalization of TFTs until 04/2022 when he presented  with recurrence -Patient is clinically euthyroid -We did discuss alternative options to include continuation of thionamides therapy, versus another RAI versus total thyroidectomy.  Given left orbitopathy I would  NOT recommend proceeding with RAI, so his only option is  surgical therapy v versus continue methimazole  -TFTs are within normal range, but TSH is at the upper limit of normal will decrease methimazole  as below   Medications : Decrease methimazole  5 mg, 1 tablet Monday through Saturday and none on Sundays    2.  Graves' disease:  -Patient with Graves' orbitopathy, a referral has been placed to ophthalmology -Patient's to see ophthalmology this Thursday    F/U in 6 months   Signed electronically by: Stefano Redgie Butts, MD  [facility]  [facility] [address] [place] [place], [place] [address] Phone: [phone] FAX: [phone]   CC: Georgina Speaks, FNP [address] [place] [address] Phone: [phone] Fax: [phone]   Return to Endocrinology clinic as below: Future Appointments  Date Time Provider Department Center  12/19/2023  9:30 AM Ilisha Blust, Donell Redgie, MD [facility] None  04/09/2024  9:20 AM Georgina Speaks, FNP [facility] None  05/04/2024  3:30 PM Teresa Redell LABOR, NP [facility] None

## 2023-12-22 ENCOUNTER — Ambulatory Visit: Admitting: Internal Medicine

## 2023-12-22 NOTE — Progress Notes (Deleted)
 Name: Steven Gibson  MRN/ DOB: [id number], 1971/03/14    Age/ Sex: 53 y.o., male    PCP: Georgina Speaks, FNP   Reason for Endocrinology Evaluation: Graves' Disease     Date of Initial Endocrinology Evaluation: 06/17/2022    HPI: Steven Gibson is a 53 y.o. male with a past medical history of HTN and dylipidemia . The patient presented for initial endocrinology clinic visit on 06/17/2022 for consultative assistance with his Graves' Disease.    He was diagnosed with graves' disease > 20 yrs ago, he was initially on Methimazole  but  subsequently was treated with RAI ablation With normalization of TFT until he presented with hyperthyroidism in12/2023 with a suppressed TSH <0.005 uIU/mL with elevated total T4 13.3ug/dL   Pt was started on Methimazole  04/2022   Father with Yvone' disease and 2 daughters   On his initial visit with me he was on methimazole  3 times daily  TRAb negative  SUBJECTIVE:    Today (12/22/23):  Ms. Eggebrecht is here for a follow up on hyperthyroidism.   Weight has been stable Denies local neck swelling  Denies palpitations Denies tremors  Denies constipation or diarrhea  Has left eye orbitopathy - with dryness  Has left eye burning and itching and double vision , scheduled to see opthalmology this week    Methimazole  5 mg , 1 tab Monday through Saturday, none on Sundays  HISTORY:  Past Medical History:  Past Medical History:  Diagnosis Date   Arthritis    Arthrofibrosis of knee joint, left    post op TKA    History of Graves' disease 2008   s/p  RAI  same year , on medication for a year then stablized no meds since  (per pt followed by pcp)   Hyperlipidemia    Hypertension    followed by pcp   (in care everywhere pt had stress echo, 03-26-2016, negative for ishemia and echo normal)   Wears glasses    Past Surgical History:  Past Surgical History:  Procedure Laterality Date   ANTERIOR CRUCIATE LIGAMENT REPAIR Left 1990S    KNEE ARTHROSCOPY W/ MEDIAL COLLATERAL LIGAMENT (MCL) REPAIR Left 1990s   KNEE ARTHROSCOPY W/ MENISCAL REPAIR Left 1990s   KNEE CLOSED REDUCTION Left 10/21/2020   Procedure: CLOSED MANIPULATION LEFT KNEE;  Surgeon: Josefina Chew, MD;  Location: [facility] ;  Service: Orthopedics;  Laterality: Left;   LIPOMA EXCISION     LEF SHOULDER AREA   TOTAL KNEE ARTHROPLASTY Left 05/06/2020   Procedure: TOTAL KNEE ARTHROPLASTY;  Surgeon: Josefina Chew, MD;  Location: [facility];  Service: Orthopedics;  Laterality: Left;   UMBILICAL HERNIA REPAIR  07-17-2015  @[email or web address]    AND RIGHT ABDOMINAL WALL EXCISION LIPOMA    Social History:  reports that he has been smoking cigarettes. He has a 12.5 pack-year smoking history. He quit smokeless tobacco use about 35 years ago. He reports current alcohol use. He reports that he does not currently use drugs after having used the following drugs: Cocaine. Family History: family history includes Cancer in his paternal grandfather; Hypertension in his brother, brother, father, and mother.   HOME MEDICATIONS: Allergies as of 12/22/2023   No Known Allergies      Medication List        Accurate as of December 22, 2023  7:01 AM. If you have any questions, ask your nurse or doctor.          amLODipine  10 MG tablet  Commonly known as: NORVASC  Take 1 tablet (10 mg total) by mouth daily.   amphetamine -dextroamphetamine  15 MG tablet Commonly known as: Adderall Take 1 tablet by mouth 3 (three) times daily.   Azelastine  HCl 137 MCG/SPRAY Soln Place 2 sprays into the nose daily.   escitalopram  20 MG tablet Commonly known as: LEXAPRO  Take 1 tablet (20 mg total) by mouth daily.   hydrALAZINE  25 MG tablet Commonly known as: APRESOLINE  TAKE 1 TABLET TWICE DAILY, AM & NIGHT   K+ POTASSIUM PO Take by mouth. Over the counter, per pt takes for leg cramps   lisinopril -hydrochlorothiazide  20-25 MG tablet Commonly known as: ZESTORETIC  Take 1 tablet by  mouth daily.   meloxicam  15 MG tablet Commonly known as: MOBIC  Take 1 tablet (15 mg total) by mouth daily.   methimazole  5 MG tablet Commonly known as: TAPAZOLE  Take 1 tablet (5 mg total) by mouth as directed. 1 tablet Monday through Saturday and none on Sundays   methocarbamol  500 MG tablet Commonly known as: ROBAXIN  Take 500-1,000 mg by mouth every 6 (six) hours as needed.   mometasone  0.1 % ointment Commonly known as: ELOCON  Apply topically daily.   pravastatin  20 MG tablet Commonly known as: PRAVACHOL  Take 1 tablet (20 mg total) by mouth daily.          REVIEW OF SYSTEMS: A comprehensive ROS was conducted with the patient and is negative except as per HPI and     OBJECTIVE:  VS: There were no vitals taken for this visit.   Wt Readings from Last 3 Encounters:  11/10/23 256 lb (116.1 kg)  08/30/23 253 lb (114.8 kg)  06/20/23 245 lb (111.1 kg)     EXAM: General: Pt appears well and is in NAD  Eyes: External eye exam with a stare, left eye  exophthalmos with chemosis .EOM intact.    Neck: General: Supple without adenopathy. Thyroid : Thyroid  size normal.  No goiter or nodules appreciated.   Lungs: Clear with good BS bilat   Heart: Auscultation: RRR.  Abdomen: soft, nontender  Extremities:  BL LE: No pretibial edema  Mental Status: Judgment, insight: Intact Orientation: Oriented to time, place, and person Mood and affect: No depression, anxiety, or agitation     DATA REVIEWED:   Latest Reference Range & Units 06/20/23 10:00  TSH 0.40 - 4.50 mIU/L 4.31  Triiodothyronine,Free,Serum 2.3 - 4.2 pg/mL 3.4  T4,Free(Direct) 0.8 - 1.8 ng/dL 1.7    Latest Reference Range & Units 11/10/23 16:18  Sodium 134 - 144 mmol/L 143  Potassium 3.5 - 5.2 mmol/L 4.1  Chloride 96 - 106 mmol/L 102  CO2 20 - 29 mmol/L 21  Glucose 70 - 99 mg/dL 897 (H)  BUN 6 - 24 mg/dL 9  Creatinine 9.23 - 8.72 mg/dL 8.95  Calcium 8.7 - 89.7 mg/dL 9.8  BUN/Creatinine Ratio 9 - 20  9   eGFR >59 mL/min/1.73 86  Alkaline Phosphatase 44 - 121 IU/L 142 (H)  Albumin 3.8 - 4.9 g/dL 4.6  AST 0 - 40 IU/L 40  ALT 0 - 44 IU/L 34  Total Protein 6.0 - 8.5 g/dL 7.3  Total Bilirubin 0.0 - 1.2 mg/dL 0.3     Latest Reference Range & Units 06/17/22 12:21  TRAB <=2.00 IU/L <1.00     ASSESSMENT/PLAN/RECOMMENDATIONS:   Hyperthyroidism:  -This is due to Graves' disease -He is s/p RAI over 2 decades ago with normalization of TFTs until 04/2022 when he presented with recurrence -Patient is clinically euthyroid -We did discuss  alternative options to include continuation of thionamides therapy, versus another RAI versus total thyroidectomy.  Given left orbitopathy I would  NOT recommend proceeding with RAI, so his only option is  surgical therapy v versus continue methimazole  -TFTs are within normal range, but TSH is at the upper limit of normal will decrease methimazole  as below   Medications : Decrease methimazole  5 mg, 1 tablet Monday through Saturday and none on Sundays    2.  Graves' disease:  -Patient with Graves' orbitopathy, a referral has been placed to ophthalmology -Patient's to see ophthalmology this Thursday    F/U in 6 months   Signed electronically by: Stefano Redgie Butts, MD  [facility]  [facility] [address] [place], [place] [address] Phone: [phone] FAX: [phone]   CC: Georgina Speaks, FNP [address] [place] [address] Phone: [phone] Fax: [phone]   Return to Endocrinology clinic as below: Future Appointments  Date Time Provider Department Center  12/22/2023  8:50 AM Lorna Strother, Donell Redgie, MD [facility] None  04/09/2024  9:20 AM Georgina Speaks, FNP [facility] None  05/04/2024  3:30 PM Teresa Redell LABOR, NP [facility] None

## 2023-12-25 ENCOUNTER — Other Ambulatory Visit: Payer: Self-pay | Admitting: Nurse Practitioner

## 2024-01-02 DIAGNOSIS — M545 Low back pain, unspecified: Secondary | ICD-10-CM | POA: Diagnosis not present

## 2024-01-10 DIAGNOSIS — M25561 Pain in right knee: Secondary | ICD-10-CM | POA: Diagnosis not present

## 2024-01-17 ENCOUNTER — Telehealth: Payer: Self-pay | Admitting: Behavioral Health

## 2024-01-17 ENCOUNTER — Other Ambulatory Visit: Payer: Self-pay

## 2024-01-17 DIAGNOSIS — F900 Attention-deficit hyperactivity disorder, predominantly inattentive type: Secondary | ICD-10-CM

## 2024-01-17 NOTE — Telephone Encounter (Signed)
 Pt wife called again about Adderall refill. He will be out after today.

## 2024-01-17 NOTE — Telephone Encounter (Signed)
 Wife LVM requesting Rx for Adderall to [facility] [address]

## 2024-01-17 NOTE — Telephone Encounter (Signed)
Pended 3 RF.

## 2024-01-18 MED ORDER — AMPHETAMINE-DEXTROAMPHETAMINE 15 MG PO TABS: 15.0000 mg | ORAL_TABLET | Freq: Three times a day (TID) | ORAL | 0 refills | Status: DC

## 2024-01-19 DIAGNOSIS — M25561 Pain in right knee: Secondary | ICD-10-CM | POA: Diagnosis not present

## 2024-01-26 DIAGNOSIS — M25561 Pain in right knee: Secondary | ICD-10-CM | POA: Diagnosis not present

## 2024-01-30 ENCOUNTER — Ambulatory Visit (INDEPENDENT_AMBULATORY_CARE_PROVIDER_SITE_OTHER): Admitting: Nurse Practitioner

## 2024-01-30 ENCOUNTER — Encounter: Payer: Self-pay | Admitting: Nurse Practitioner

## 2024-01-30 VITALS — BP 140/76 | HR 84 | Temp 98.8°F | Ht 74.0 in | Wt 232.0 lb

## 2024-01-30 DIAGNOSIS — Z111 Encounter for screening for respiratory tuberculosis: Secondary | ICD-10-CM | POA: Diagnosis not present

## 2024-01-30 DIAGNOSIS — R9431 Abnormal electrocardiogram [ECG] [EKG]: Secondary | ICD-10-CM | POA: Diagnosis not present

## 2024-01-30 DIAGNOSIS — E782 Mixed hyperlipidemia: Secondary | ICD-10-CM | POA: Diagnosis not present

## 2024-01-30 DIAGNOSIS — R7303 Prediabetes: Secondary | ICD-10-CM | POA: Diagnosis not present

## 2024-01-30 DIAGNOSIS — Z23 Encounter for immunization: Secondary | ICD-10-CM | POA: Insufficient documentation

## 2024-01-30 DIAGNOSIS — Z139 Encounter for screening, unspecified: Secondary | ICD-10-CM

## 2024-01-30 DIAGNOSIS — Z01818 Encounter for other preprocedural examination: Secondary | ICD-10-CM | POA: Diagnosis not present

## 2024-01-30 DIAGNOSIS — I10 Essential (primary) hypertension: Secondary | ICD-10-CM | POA: Diagnosis not present

## 2024-01-30 MED ORDER — PRAVASTATIN SODIUM 20 MG PO TABS
20.0000 mg | ORAL_TABLET | Freq: Every day | ORAL | 1 refills | Status: DC
Start: 2024-01-30 — End: 2024-02-09

## 2024-01-30 MED ORDER — AMLODIPINE BESYLATE 10 MG PO TABS: 10.0000 mg | ORAL_TABLET | Freq: Every day | ORAL | 1 refills | Status: DC

## 2024-01-30 MED ORDER — LISINOPRIL-HYDROCHLOROTHIAZIDE 20-25 MG PO TABS: 1.0000 | ORAL_TABLET | Freq: Every day | ORAL | 1 refills | Status: DC

## 2024-01-30 MED ORDER — HYDRALAZINE HCL 25 MG PO TABS: ORAL_TABLET | ORAL | 1 refills | Status: DC

## 2024-01-30 NOTE — Assessment & Plan Note (Signed)
 Influenza vaccine administered Encouraged to take Tylenol as needed for fever or muscle aches.

## 2024-01-30 NOTE — Assessment & Plan Note (Signed)
 Continue taking statin, tolerating well encouraged to be adherent to taking daily.

## 2024-01-30 NOTE — Assessment & Plan Note (Signed)
 Will clear medically once receive labs, he has to go to Cardiology for clearance due to abnormal EKG

## 2024-01-30 NOTE — Assessment & Plan Note (Signed)
 HgbA1c is stable, continue focusing on healthy diet.

## 2024-01-30 NOTE — Assessment & Plan Note (Signed)
 Blood pressure is slightly elevated, slightly improved with repeat, however he has only been taking hs hydralazine  once a day vs twice a day. He will start taking twice a day. He is encouraged to limit his salts.

## 2024-01-30 NOTE — Progress Notes (Signed)
 LILLETTE Kristeen JINNY Gladis, CMA,acting as a Neurosurgeon for Steven Ada, FNP.,have documented all relevant documentation on the behalf of Steven Ada, FNP,as directed by  Steven Ada, FNP while in the presence of Steven Ada, FNP.  Subjective:  Patient ID: Steven Gibson , male    DOB: [birth year] , 53 y.o.   MRN: [id number]  Chief Complaint  Patient presents with   Pre-op Exam    Patient presents today for a pre-op evual, Patient reports compliance with medication. Patient denies any chest pain, SOB, or headaches. Patient     Here for preop clearance for his right knee. He is possibly scheduling for October 2nd surgery for partial knee replacement. He has been under more stress with his mother. Denies chest pain. 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      Past Medical History:  Diagnosis Date   Anxiety    Arthritis    Arthrofibrosis of knee joint, left    post op TKA    History of Graves' disease 2008   s/p  RAI  same year , on medication for a year then stablized no meds since  (per pt followed by pcp)   Hyperlipidemia    Hypertension    followed by pcp   (in care everywhere pt had stress echo, 03-26-2016, negative for ishemia and echo normal)   Thyroid  disease    Wears glasses      Family History  Problem Relation Age of Onset   Hypertension Mother    Hypertension Father    Hypertension Brother    Cancer Paternal Grandfather    Hypertension Brother      Current Outpatient Medications:    amphetamine -dextroamphetamine  (ADDERALL) 15 MG tablet, Take 1 tablet by mouth 3 (three) times daily., Disp: 90 tablet, Rfl: 0   [START ON 02/14/2024] amphetamine -dextroamphetamine  (ADDERALL) 15 MG tablet, Take 1 tablet by mouth 3 (three) times daily., Disp: 90 tablet, Rfl: 0   [START ON 03/13/2024] amphetamine -dextroamphetamine  (ADDERALL) 15 MG tablet, Take 1 tablet by mouth 3 (three) times daily., Disp: 90 tablet, Rfl: 0   Azelastine  HCl 137 MCG/SPRAY  SOLN, Place 2 sprays into the nose daily., Disp: 30 mL, Rfl: 2   escitalopram  (LEXAPRO ) 20 MG tablet, Take 1 tablet (20 mg total) by mouth daily., Disp: 90 tablet, Rfl: 1   meloxicam  (MOBIC ) 15 MG tablet, TAKE 1 TABLET (15 MG TOTAL) BY MOUTH DAILY., Disp: 90 tablet, Rfl: 1   methimazole  (TAPAZOLE ) 5 MG tablet, Take 1 tablet (5 mg total) by mouth as directed. 1 tablet Monday through Saturday and none on Sundays, Disp: 78 tablet, Rfl: 3   methocarbamol  (ROBAXIN ) 500 MG tablet, Take 500-1,000 mg by mouth every 6 (six) hours as needed., Disp: , Rfl:    mometasone  (ELOCON ) 0.1 % ointment, Apply topically daily., Disp: 45 g, Rfl: 2   Potassium Chloride  (K+ POTASSIUM PO), Take by mouth. Over the counter, per pt takes for leg cramps, Disp: , Rfl:    amLODipine  (NORVASC ) 10 MG tablet, Take 1 tablet (10 mg total) by mouth daily., Disp: 90 tablet, Rfl: 1   hydrALAZINE  (APRESOLINE ) 25 MG tablet, TAKE 1 TABLET TWICE DAILY, AM & NIGHT, Disp: 180 tablet, Rfl: 1   lisinopril -hydrochlorothiazide  (ZESTORETIC ) 20-25 MG tablet, Take 1 tablet by mouth daily., Disp: 90 tablet, Rfl: 1   pravastatin  (PRAVACHOL ) 20 MG tablet, Take 1 tablet (20 mg total) by mouth daily., Disp: 90 tablet, Rfl: 1   No Known Allergies  Review of Systems  Constitutional: Negative.   Eyes: Negative.   Respiratory: Negative.    Cardiovascular: Negative.  Negative for chest pain, palpitations and leg swelling.  Gastrointestinal: Negative.   Musculoskeletal: Negative.   Skin: Negative.   Neurological: Negative.  Negative for headaches.  Psychiatric/Behavioral: Negative.       Today's Vitals   01/30/24 1412 01/30/24 1446  BP: (!) 140/80 (!) 140/76  Pulse: 84   Temp: 98.8 F (37.1 C)   TempSrc: Oral   Weight: 232 lb (105.2 kg)   Height: 6' 2 (1.88 m)   PainSc: 0-No pain    Body mass index is 29.79 kg/m.  Wt Readings from Last 3 Encounters:  01/30/24 232 lb (105.2 kg)  11/10/23 256 lb (116.1 kg)  08/30/23 253 lb (114.8 kg)     The 10-year ASCVD risk score (Arnett DK, et al., 2019) is: 19.1%   Values used to calculate the score:     Age: 29 years     Clincally relevant sex: Male     Is Non-Hispanic African American: Yes     Diabetic: No     Tobacco smoker: Yes     Systolic Blood Pressure: 140 mmHg     Is BP treated: Yes     HDL Cholesterol: 61 mg/dL     Total Cholesterol: 222 mg/dL  Objective:  Physical Exam Vitals and nursing note reviewed.  Constitutional:      General: He is not in acute distress.    Appearance: Normal appearance. He is obese.  Cardiovascular:     Rate and Rhythm: Normal rate and regular rhythm.     Pulses: Normal pulses.     Heart sounds: Normal heart sounds. No murmur heard. Pulmonary:     Effort: Pulmonary effort is normal. No respiratory distress.     Breath sounds: Normal breath sounds. No wheezing.  Skin:    General: Skin is warm and dry.     Capillary Refill: Capillary refill takes less than 2 seconds.  Neurological:     General: No focal deficit present.     Mental Status: He is alert and oriented to person, place, and time.     Cranial Nerves: No cranial nerve deficit.     Motor: No weakness.  Psychiatric:        Mood and Affect: Mood normal.        Behavior: Behavior normal.        Thought Content: Thought content normal.      Assessment And Plan:  Essential hypertension Assessment & Plan: Blood pressure is slightly elevated, slightly improved with repeat, however he has only been taking hs hydralazine  once a day vs twice a day. He will start taking twice a day. He is encouraged to limit his salts.   Orders: -     BMP8+eGFR -     amLODIPine  Besylate; Take 1 tablet (10 mg total) by mouth daily.  Dispense: 90 tablet; Refill: 1 -     hydrALAZINE  HCl; TAKE 1 TABLET TWICE DAILY, AM & NIGHT  Dispense: 180 tablet; Refill: 1 -     Lisinopril -hydroCHLOROthiazide ; Take 1 tablet by mouth daily.  Dispense: 90 tablet; Refill: 1 -     Ambulatory referral to  Cardiology  Mixed hyperlipidemia Assessment & Plan: Continue taking statin, tolerating well encouraged to be adherent to taking daily.  Orders: -     Lipid panel -     BMP8+eGFR -     Pravastatin  Sodium; Take 1 tablet (  20 mg total) by mouth daily.  Dispense: 90 tablet; Refill: 1  Prediabetes Assessment & Plan: HgbA1c is stable, continue focusing on healthy diet    Pre-op exam Assessment & Plan: Will clear medically once receive labs, he has to go to Cardiology for clearance due to abnormal EKG  Orders: -     EKG 12-Lead -     Ambulatory referral to Cardiology  Screening for tuberculosis -     QuantiFERON-TB Gold Plus  Need for influenza vaccination Assessment & Plan: Influenza vaccine administered Encouraged to take Tylenol  as needed for fever or muscle aches.   Orders: -     Flu vaccine trivalent PF, 6mos and older(Flulaval,Afluria,Fluarix,Fluzone)  Abnormal EKG Assessment & Plan: He has frequent ectopic beats, this is different from his previous EKG, will refer to Cardiology for evaluation and pre op clearance.   Orders: -     Ambulatory referral to Cardiology  Encounter for screening -     Hepatitis B surface antibody,qualitative    No follow-ups on file.  Patient was given opportunity to ask questions. Patient verbalized understanding of the plan and was able to repeat key elements of the plan. All questions were answered to their satisfaction.    LILLETTE Steven Ada, FNP, have reviewed all documentation for this visit. The documentation on 01/30/24 for the exam, diagnosis, procedures, and orders are all accurate and complete.   IF YOU HAVE BEEN REFERRED TO A SPECIALIST, IT MAY TAKE 1-2 WEEKS TO SCHEDULE/PROCESS THE REFERRAL. IF YOU HAVE NOT HEARD FROM US /SPECIALIST IN TWO WEEKS, PLEASE GIVE US  A CALL AT [phone] X [phone].

## 2024-01-30 NOTE — Assessment & Plan Note (Signed)
 He has frequent ectopic beats, this is different from his previous EKG, will refer to Cardiology for evaluation and pre op clearance.

## 2024-01-31 LAB — BMP8+EGFR
BUN/Creatinine Ratio: 12 (ref 9–20)
BUN: 12 mg/dL (ref 6–24)
CO2: 26 mmol/L (ref 20–29)
Calcium: 10.1 mg/dL (ref 8.7–10.2)
Chloride: 102 mmol/L (ref 96–106)
Creatinine, Ser: 0.97 mg/dL (ref 0.76–1.27)
Glucose: 104 mg/dL — ABNORMAL HIGH (ref 70–99)
Potassium: 4.5 mmol/L (ref 3.5–5.2)
Sodium: 139 mmol/L (ref 134–144)
eGFR: 93 mL/min/1.73 (ref >59)

## 2024-01-31 LAB — QUANTIFERON-TB GOLD PLUS
QuantiFERON Mitogen Value: >10 [IU]/mL
QuantiFERON Nil Value: 0.06 [IU]/mL
QuantiFERON TB1 Ag Value: 0.07 [IU]/mL
QuantiFERON TB2 Ag Value: 0.07 [IU]/mL
QuantiFERON-TB Gold Plus: NEGATIVE

## 2024-01-31 LAB — LIPID PANEL
Chol/HDL Ratio: 3.4 ratio (ref 0.0–5.0)
Cholesterol, Total: 239 mg/dL — ABNORMAL HIGH (ref 100–199)
HDL: 70 mg/dL (ref >39)
LDL Chol Calc (NIH): 157 mg/dL — ABNORMAL HIGH (ref 0–99)
Triglycerides: 69 mg/dL (ref 0–149)
VLDL Cholesterol Cal: 12 mg/dL (ref 5–40)

## 2024-01-31 LAB — HEPATITIS B SURFACE ANTIBODY,QUALITATIVE: Hep B Surface Ab, Qual: REACTIVE

## 2024-02-03 ENCOUNTER — Telehealth: Payer: Self-pay

## 2024-02-03 ENCOUNTER — Encounter: Payer: Self-pay | Admitting: Nurse Practitioner

## 2024-02-03 NOTE — Telephone Encounter (Signed)
 Copied from CRM [id number]. Topic: Appointments - Scheduling Inquiry for Clinic >> Feb 03, 2024 10:54 AM Herma G wrote: Reason for CRM: Pt called to get TB testing done, please call back at [phone] to schedule.   LVM FOR PATIENT.

## 2024-02-06 ENCOUNTER — Encounter: Payer: Self-pay | Admitting: Nurse Practitioner

## 2024-02-07 NOTE — Progress Notes (Addendum)
 Cardiology Office Note Date:  02/09/2024  ID:  [name], DOB Sep 20, 1970, MRN [id number] PCP:  Georgina Speaks, FNP  Cardiologist: Joelle VEAR Ren Donley, MD   History of Present Illness: Steven Gibson is a 53 y.o. male who presents for pre-op evaluation.   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 He denies any CP or dyspnea with exertion. He does not check his BP but does say he had been taking hydralazine  only once daily. He reports 10-pack year tobacco use but has been trying to quit.   ROS: Please see the history of present illness. All other systems are reviewed and negative.   Past Medical History:  Diagnosis Date   Anxiety    Arthritis    Arthrofibrosis of knee joint, left    post op TKA    History of Graves' disease 2008   s/p  RAI  same year , on medication for a year then stablized no meds since  (per pt followed by pcp)   Hyperlipidemia    Hypertension    followed by pcp   (in care everywhere pt had stress echo, 03-26-2016, negative for ishemia and echo normal)   Thyroid  disease    Wears glasses     Past Surgical History:  Procedure Laterality Date   ANTERIOR CRUCIATE LIGAMENT REPAIR Left 1990S   HERNIA REPAIR     JOINT REPLACEMENT  05/06/2020   KNEE ARTHROSCOPY W/ MEDIAL COLLATERAL LIGAMENT (MCL) REPAIR Left 1990s   KNEE ARTHROSCOPY W/ MENISCAL REPAIR Left 1990s   KNEE CLOSED REDUCTION Left 10/21/2020   Procedure: CLOSED MANIPULATION LEFT KNEE;  Surgeon: Josefina Chew, MD;  Location: [facility];  Service: Orthopedics;  Laterality: Left;   LIPOMA EXCISION     LEF SHOULDER AREA   TOTAL KNEE ARTHROPLASTY Left 05/06/2020   Procedure: TOTAL KNEE ARTHROPLASTY;  Surgeon: Josefina Chew, MD;  Location: [facility];  Service: Orthopedics;  Laterality: Left;   UMBILICAL HERNIA REPAIR  07-17-2015  @[email or web address]    AND RIGHT ABDOMINAL WALL EXCISION LIPOMA    Current Outpatient  Medications  Medication Sig Dispense Refill   amLODipine  (NORVASC ) 10 MG tablet Take 1 tablet (10 mg total) by mouth daily. 90 tablet 1   amphetamine -dextroamphetamine  (ADDERALL) 15 MG tablet Take 1 tablet by mouth 3 (three) times daily. 90 tablet 0   [START ON 02/14/2024] amphetamine -dextroamphetamine  (ADDERALL) 15 MG tablet Take 1 tablet by mouth 3 (three) times daily. 90 tablet 0   [START ON 03/13/2024] amphetamine -dextroamphetamine  (ADDERALL) 15 MG tablet Take 1 tablet by mouth 3 (three) times daily. 90 tablet 0   Azelastine  HCl 137 MCG/SPRAY SOLN Place 2 sprays into the nose daily. 30 mL 2   escitalopram  (LEXAPRO ) 20 MG tablet Take 1 tablet (20 mg total) by mouth daily. 90 tablet 1   hydrALAZINE  (APRESOLINE ) 25 MG tablet TAKE 1 TABLET TWICE DAILY, AM & NIGHT 180 tablet 1   lisinopril -hydrochlorothiazide  (ZESTORETIC ) 20-25 MG tablet Take 1 tablet by mouth daily. 90 tablet 1   meloxicam  (MOBIC ) 15 MG tablet TAKE 1 TABLET (15 MG TOTAL) BY MOUTH DAILY. 90 tablet 1   methimazole  (TAPAZOLE ) 5 MG tablet Take 1 tablet (5 mg total) by mouth as directed. 1 tablet Monday through Saturday and none on Sundays 78 tablet 3   methocarbamol  (ROBAXIN ) 500 MG tablet Take 500-1,000 mg by mouth every 6 (six) hours as needed.     Potassium  Chloride (K+ POTASSIUM PO) Take by mouth. Over the counter, per pt takes for leg cramps     pravastatin  (PRAVACHOL ) 20 MG tablet Take 1 tablet (20 mg total) by mouth daily. 90 tablet 1   No current facility-administered medications for this visit.    Allergies:   Patient has no known allergies.   Social History:  Current smoker 10-pack year  Family History:  Noncontributory  PHYSICAL EXAM: VS:  BP (!) 140/100   Pulse 88   Ht 6' 2 (1.88 m)   Wt 238 lb 1.6 oz (108 kg)   SpO2 99%   BMI 30.57 kg/m  , BMI Body mass index is 30.57 kg/m. GEN: Well nourished, well developed, in no acute distress HEENT: normal Neck: no JVD, carotid bruits, or masses Cardiac:RRR; no  murmurs, rubs, or gallops,no edema  Respiratory:  CTAB bilaterally, normal work of breathing GI: soft, nontender, nondistended, + BS Extremities: No LE edema Skin: warm and dry, no rash Neuro:  Strength and sensation are intact  Recent Labs: Reviewed   Studies: Reviewed  ASSESSMENT AND PLAN: Steven Gibson is a 53 y.o. male who presents for pre-op evaluation.   #Pre-op for right knee surgery #HTN #HLD #Tobacco use - Presenting for pre-op evaluation for right knee surgery. Patient is asymptomatic despite being active and has no history of cardiac problems. We will order TTE to make sure heart function is normal and if normal EF, we can proceed to surgery without further evaluation. - Given LDL 157 and ASCVD risk at 80%, will switch pravastatin  to rosuvastatin  20 and repeat lipid panel in 6 months - Recommended to check BP at least 3x/week and keep a log - Discuss smoking cessation  Addendum 9/28 3:30 PM - TTE results with normal biventricular function --> no further evaluation needed prior to his surgery.  - His RV is enlarged and as part of the workup, I want to proceed with RHC with shunt run. I discuss the risk and benefits of the procedure with the patient and he agreed to move forward.   Signed, Joelle VEAR Ren Donley, MD  02/09/2024 11:15 AM    [facility]

## 2024-02-07 NOTE — H&P (View-Only) (Signed)
 Cardiology Office Note Date:  02/09/2024  ID:  [name], DOB Sep 20, 1970, MRN [id number] PCP:  Georgina Speaks, FNP  Cardiologist: Joelle VEAR Ren Donley, MD   History of Present Illness: Steven Gibson is a 53 y.o. male who presents for pre-op evaluation.   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 He denies any CP or dyspnea with exertion. He does not check his BP but does say he had been taking hydralazine  only once daily. He reports 10-pack year tobacco use but has been trying to quit.   ROS: Please see the history of present illness. All other systems are reviewed and negative.   Past Medical History:  Diagnosis Date   Anxiety    Arthritis    Arthrofibrosis of knee joint, left    post op TKA    History of Graves' disease 2008   s/p  RAI  same year , on medication for a year then stablized no meds since  (per pt followed by pcp)   Hyperlipidemia    Hypertension    followed by pcp   (in care everywhere pt had stress echo, 03-26-2016, negative for ishemia and echo normal)   Thyroid  disease    Wears glasses     Past Surgical History:  Procedure Laterality Date   ANTERIOR CRUCIATE LIGAMENT REPAIR Left 1990S   HERNIA REPAIR     JOINT REPLACEMENT  05/06/2020   KNEE ARTHROSCOPY W/ MEDIAL COLLATERAL LIGAMENT (MCL) REPAIR Left 1990s   KNEE ARTHROSCOPY W/ MENISCAL REPAIR Left 1990s   KNEE CLOSED REDUCTION Left 10/21/2020   Procedure: CLOSED MANIPULATION LEFT KNEE;  Surgeon: Josefina Chew, MD;  Location: [facility];  Service: Orthopedics;  Laterality: Left;   LIPOMA EXCISION     LEF SHOULDER AREA   TOTAL KNEE ARTHROPLASTY Left 05/06/2020   Procedure: TOTAL KNEE ARTHROPLASTY;  Surgeon: Josefina Chew, MD;  Location: [facility];  Service: Orthopedics;  Laterality: Left;   UMBILICAL HERNIA REPAIR  07-17-2015  @[email or web address]    AND RIGHT ABDOMINAL WALL EXCISION LIPOMA    Current Outpatient  Medications  Medication Sig Dispense Refill   amLODipine  (NORVASC ) 10 MG tablet Take 1 tablet (10 mg total) by mouth daily. 90 tablet 1   amphetamine -dextroamphetamine  (ADDERALL) 15 MG tablet Take 1 tablet by mouth 3 (three) times daily. 90 tablet 0   [START ON 02/14/2024] amphetamine -dextroamphetamine  (ADDERALL) 15 MG tablet Take 1 tablet by mouth 3 (three) times daily. 90 tablet 0   [START ON 03/13/2024] amphetamine -dextroamphetamine  (ADDERALL) 15 MG tablet Take 1 tablet by mouth 3 (three) times daily. 90 tablet 0   Azelastine  HCl 137 MCG/SPRAY SOLN Place 2 sprays into the nose daily. 30 mL 2   escitalopram  (LEXAPRO ) 20 MG tablet Take 1 tablet (20 mg total) by mouth daily. 90 tablet 1   hydrALAZINE  (APRESOLINE ) 25 MG tablet TAKE 1 TABLET TWICE DAILY, AM & NIGHT 180 tablet 1   lisinopril -hydrochlorothiazide  (ZESTORETIC ) 20-25 MG tablet Take 1 tablet by mouth daily. 90 tablet 1   meloxicam  (MOBIC ) 15 MG tablet TAKE 1 TABLET (15 MG TOTAL) BY MOUTH DAILY. 90 tablet 1   methimazole  (TAPAZOLE ) 5 MG tablet Take 1 tablet (5 mg total) by mouth as directed. 1 tablet Monday through Saturday and none on Sundays 78 tablet 3   methocarbamol  (ROBAXIN ) 500 MG tablet Take 500-1,000 mg by mouth every 6 (six) hours as needed.     Potassium  Chloride (K+ POTASSIUM PO) Take by mouth. Over the counter, per pt takes for leg cramps     pravastatin  (PRAVACHOL ) 20 MG tablet Take 1 tablet (20 mg total) by mouth daily. 90 tablet 1   No current facility-administered medications for this visit.    Allergies:   Patient has no known allergies.   Social History:  Current smoker 10-pack year  Family History:  Noncontributory  PHYSICAL EXAM: VS:  BP (!) 140/100   Pulse 88   Ht 6' 2 (1.88 m)   Wt 238 lb 1.6 oz (108 kg)   SpO2 99%   BMI 30.57 kg/m  , BMI Body mass index is 30.57 kg/m. GEN: Well nourished, well developed, in no acute distress HEENT: normal Neck: no JVD, carotid bruits, or masses Cardiac:RRR; no  murmurs, rubs, or gallops,no edema  Respiratory:  CTAB bilaterally, normal work of breathing GI: soft, nontender, nondistended, + BS Extremities: No LE edema Skin: warm and dry, no rash Neuro:  Strength and sensation are intact  Recent Labs: Reviewed   Studies: Reviewed  ASSESSMENT AND PLAN: Steven Gibson is a 53 y.o. male who presents for pre-op evaluation.   #Pre-op for right knee surgery #HTN #HLD #Tobacco use - Presenting for pre-op evaluation for right knee surgery. Patient is asymptomatic despite being active and has no history of cardiac problems. We will order TTE to make sure heart function is normal and if normal EF, we can proceed to surgery without further evaluation. - Given LDL 157 and ASCVD risk at 80%, will switch pravastatin  to rosuvastatin  20 and repeat lipid panel in 6 months - Recommended to check BP at least 3x/week and keep a log - Discuss smoking cessation  Addendum 9/28 3:30 PM - TTE results with normal biventricular function --> no further evaluation needed prior to his surgery.  - His RV is enlarged and as part of the workup, I want to proceed with RHC with shunt run. I discuss the risk and benefits of the procedure with the patient and he agreed to move forward.   Signed, Joelle VEAR Ren Donley, MD  02/09/2024 11:15 AM    [facility]

## 2024-02-08 ENCOUNTER — Telehealth: Payer: Self-pay | Admitting: *Deleted

## 2024-02-08 NOTE — Telephone Encounter (Signed)
 2nd attempt to reach pt to obtain who is doing his surgery so that we be able to have surgery clearance ready for the new pt appt with the cardiologist appt tomorrow.   I will send pt a message thru [facility] CHART as well.

## 2024-02-08 NOTE — Telephone Encounter (Addendum)
 I called Burnard Gibson to be able to obtain the surgery clearance request for the pt who has a new pt appt with us  tomorrow and we did not have surgery clearance request yet.      Pre-operative Risk Assessment    Patient Name: Steven Gibson  DOB: 1970/08/26 MRN: [id number]   Date of last office visit: NONE  Date of next office visit: 02/09/24 DR. AZOBOU-TONLEU NEW PT APPT    Request for Surgical Clearance    Procedure:  RIGHT PARTIAL KNEE ARTHROPLASTY Date of Surgery:  Clearance TBD         Surgeon:  DR. MARCHWAINYSurgeon's Group or Practice Name:  [facility] Phone number:  [phone]  Fax number:  [phone] Steven Gibson   Type of Clearance Requested:   - Medical    Type of Anesthesia:  SPINAL   Additional requests/questions:    Steven Gibson   02/08/2024, 3:04 PM

## 2024-02-08 NOTE — Telephone Encounter (Signed)
 Patient has upcoming appt clearance can be addressed at office visit and note has been made to appointment line

## 2024-02-08 NOTE — Telephone Encounter (Signed)
   Name: Steven Gibson  DOB: December 29, 1970  MRN: [id number]  Primary Cardiologist: None  Chart reviewed as part of pre-operative protocol coverage. The patient has an upcoming visit scheduled with Dr. DENEISE  on 02/09/2024 at which time clearance can be addressed in case there are any issues that would impact surgical recommendations.  RIGHT PARTIAL KNEE ARTHROPLASTY Is not scheduled until TBD as below. I added preop FYI to appointment note so that provider is aware to address at time of outpatient visit.  Per office protocol the cardiology provider should forward their finalized clearance decision and recommendations regarding antiplatelet therapy to the requesting party below.     I will route this message as FYI to requesting party and remove this message from the preop box as separate preop APP input not needed at this time.   Please call with any questions.  Lamarr Satterfield, NP  02/08/2024, 4:33 PM

## 2024-02-08 NOTE — Telephone Encounter (Signed)
 Rose J. CMA sent me a secure chat stating the pt has a new pt appt tomorrow for abnormal EKG and for preop clearance. She did not have any information who was doing the surgery.   I reached out to the pt and left a message that we need the clearance request from the surgeon's office in time for the new pt appt tomorrow with Dr. Deneise.

## 2024-02-09 ENCOUNTER — Other Ambulatory Visit (HOSPITAL_COMMUNITY): Payer: Self-pay

## 2024-02-09 ENCOUNTER — Ambulatory Visit

## 2024-02-09 VITALS — BP 140/100 | HR 88 | Ht 74.0 in | Wt 238.1 lb

## 2024-02-09 DIAGNOSIS — Z01818 Encounter for other preprocedural examination: Secondary | ICD-10-CM

## 2024-02-09 DIAGNOSIS — I10 Essential (primary) hypertension: Secondary | ICD-10-CM | POA: Diagnosis not present

## 2024-02-09 DIAGNOSIS — Z6831 Body mass index (BMI) 31.0-31.9, adult: Secondary | ICD-10-CM

## 2024-02-09 DIAGNOSIS — E782 Mixed hyperlipidemia: Secondary | ICD-10-CM | POA: Diagnosis not present

## 2024-02-09 DIAGNOSIS — E66811 Obesity, class 1: Secondary | ICD-10-CM

## 2024-02-09 DIAGNOSIS — E6609 Other obesity due to excess calories: Secondary | ICD-10-CM

## 2024-02-09 DIAGNOSIS — R7303 Prediabetes: Secondary | ICD-10-CM

## 2024-02-09 MED ORDER — ROSUVASTATIN CALCIUM 20 MG PO TABS
20.0000 mg | ORAL_TABLET | Freq: Every day | ORAL | 3 refills | Status: DC
  Filled 2024-02-09 – 2024-02-27 (×2): qty 30, 30d supply, fill #0
  Filled 2024-03-26 – 2024-04-11 (×2): qty 30, 30d supply, fill #1

## 2024-02-09 NOTE — Patient Instructions (Signed)
 Medication Instructions:  Your physician has recommended you make the following change in your medication:   1) STOP pravastatin  2) START rosuvastatin  (Crestor ) 20 mg daily  *If you need a refill on your cardiac medications before your next appointment, please call your pharmacy*  Lab Work: In 6 months (end of March 2026): fasting Lipid panel  You may go to any of these [facility] locations: [facility] - [address] ([facility]) - [address] [phone] N. [address] - [address] (1st floor, next to pharmacy)   If you have labs (blood work) drawn today and your tests are completely normal, you will receive your results only by: [employer] (if you have MyChart) OR A paper copy in the mail If you have any lab test that is abnormal or we need to change your treatment, we will call you to review the results.  Testing/Procedures: Your physician has requested that you have an echocardiogram. Echocardiography is a painless test that uses sound waves to create images of your heart. It provides your doctor with information about the size and shape of your heart and how well your heart's chambers and valves are working. This procedure takes approximately one hour. There are no restrictions for this procedure. Please do NOT wear cologne, perfume, aftershave, or lotions (deodorant is allowed). Please arrive 15 minutes prior to your appointment time.  Please note: We ask at that you not bring children with you during ultrasound (echo/ vascular) testing. Due to room size and safety concerns, children are not allowed in the ultrasound rooms during exams. Our front office staff cannot provide observation of children in our lobby area while testing is being conducted. An adult accompanying a patient to their appointment will only be allowed in the ultrasound room at the discretion of the ultrasound technician under special circumstances. We  apologize for any inconvenience.  Follow-Up: At [facility], you and your health needs are our priority.  As part of our continuing mission to provide you with exceptional heart care, our providers are all part of one team.  This team includes your primary Cardiologist (physician) and Advanced Practice Providers or APPs (Physician Assistants and Nurse Practitioners) who all work together to provide you with the care you need, when you need it.  Your next appointment:   As needed  Provider:   Joelle VEAR Ren Donley, MD  We recommend signing up for the patient portal called MyChart.  Sign up information is provided on this After Visit Summary.  MyChart is used to connect with patients for Virtual Visits (Telemedicine).  Patients are able to view lab/test results, encounter notes, upcoming appointments, etc.  Non-urgent messages can be sent to your provider as well.   To learn more about what you can do with MyChart, go to [email or web address].   Other Instructions      Blood Pressure Record Sheet To take your blood pressure, you will need a blood pressure machine. You can buy a blood pressure machine (blood pressure monitor) at your clinic, drug store, or online. When choosing one, consider: An automatic monitor that has an arm cuff. A cuff that wraps snugly around your upper arm. You should be able to fit only one finger between your arm and the cuff. A device that stores blood pressure reading results. Do not choose a monitor that measures your blood pressure from your wrist or finger. Follow your health care provider's instructions for how to  take your blood pressure. To use this form: Take your blood pressure medications every day These measurements should be taken when you have been at rest for at least 10-15 min Take at least 2 readings with each blood pressure check. This makes sure the results are correct. Wait 1-2 minutes between measurements. Write down the results  in the spaces on this form. Keep in mind it should always be recorded systolic over diastolic. Both numbers are important.  Repeat this every day for 2-3 weeks, or as told by your health care provider.  Make a follow-up appointment with your health care provider to discuss the results.   Blood Pressure Log Date Medications taken? (Y/N) Blood Pressure Time of Day

## 2024-02-10 ENCOUNTER — Ambulatory Visit (HOSPITAL_COMMUNITY)
Admission: RE | Admit: 2024-02-10 | Discharge: 2024-02-10 | Disposition: A | Discharge status: Home / Self Care | Source: Ambulatory Visit

## 2024-02-10 DIAGNOSIS — Z01818 Encounter for other preprocedural examination: Secondary | ICD-10-CM | POA: Diagnosis not present

## 2024-02-10 DIAGNOSIS — Z01810 Encounter for preprocedural cardiovascular examination: Secondary | ICD-10-CM | POA: Diagnosis not present

## 2024-02-10 LAB — ECHOCARDIOGRAM COMPLETE: S' Lateral: 3.7 cm

## 2024-02-12 ENCOUNTER — Ambulatory Visit: Payer: Self-pay

## 2024-02-12 DIAGNOSIS — I10 Essential (primary) hypertension: Secondary | ICD-10-CM

## 2024-02-12 DIAGNOSIS — Z01818 Encounter for other preprocedural examination: Secondary | ICD-10-CM

## 2024-02-12 NOTE — Telephone Encounter (Signed)
 I called Mr Nitta to discuss his ultrasound findings. His echo showed normal EF and thus, he can proceed with surgery without additional testing. It did show enlarged RV and as part of workup, I recommended right heart cath with shunt run. The patient understood the procedure and agreed to move forward.  Joelle DEL. Ren Ny, MD

## 2024-02-13 ENCOUNTER — Encounter: Payer: Self-pay | Admitting: Nurse Practitioner

## 2024-02-13 NOTE — Telephone Encounter (Signed)
 Reviewed with Dr Ren and patient does not need right heart cath prior to surgery

## 2024-02-13 NOTE — Telephone Encounter (Signed)
 Echo result note and echo report faxed to [facility]  [phone] .  Call placed to patient to schedule right heart cath.  Left message to call office

## 2024-02-15 NOTE — Addendum Note (Signed)
 Addended by: GLADIS REENA GAILS on: 02/15/2024 12:01 PM   Modules accepted: Orders

## 2024-02-15 NOTE — Telephone Encounter (Signed)
 See previous patient mychart  conversation between  Dr Ren and [name] RN Reviewed with Dr Ren and patient does not need right heart cath prior to surgery  02/13/24    Result note from 02/12/24    I called Steven Gibson to discuss his ultrasound findings. His echo showed normal EF and thus, he can proceed with surgery without additional testing. It did show enlarged RV and as part of workup, I recommended right heart cath with shunt run. The patient understood the procedure and agreed to move forward.   Joelle DEL. Ren Ny, MD

## 2024-02-15 NOTE — Telephone Encounter (Addendum)
 Schedule for Oct 6 at 7:30 am  with Dr End- right heart cath with shunt run off   Patient will need to have CBC for preop.   Called wife- need to leave message on phone  instruction and written instruction sent patient via mychart Message also to call or Mychart if they have any question    [facility] A DEPT OF [facility] AT [address] A DEPT OF THE [place] [address] [place] [address] Dept: [phone] Loc: [phone]  Fuad Forget III  02/15/2024  You are scheduled for a Right Cardiac Catheterization on Monday, October 6 with Dr. Lonni End.  1. Please arrive at the [facility] (Main Entrance A) at [facility]: [address] [place], [place] [address] at 5:30 AM (This time is 2 hour(s) before your procedure to ensure your preparation).   Free valet parking service is available. You will check in at ADMITTING. The support person will be asked to wait in the waiting room.  It is OK to have someone drop you off and come back when you are ready to be discharged.    Special note: Every effort is made to have your procedure done on time. Please understand that emergencies sometimes delay scheduled procedures.  2. Diet: Nothing to eat after midnight.   3. Hydration: You need to be well hydrated before your procedure. On October 6, you may drink approved liquids (see below) until 2 hours before the procedure, with 16 oz of water  as your last intake.   List of approved liquids water , clear juice, clear tea, black coffee, fruit juices, non-citric and without pulp, carbonated beverages, Gatorade, Kool -Aid, plain Jello-O and plain ice popsicles.  4. Labs: You will need to have blood drawn on Thursday, October 2 at [facility] (1st Floor), [address], [place], [place] [address]. You do not need to be fasting.  5. Medication instructions in preparation for  your procedure:   Contrast Allergy: No    Do not take  Lisinopril  -hydrochlorothiazide  Monday, October 6,   On the morning of your procedure, take your  morning medicines NOT listed above.  You may use sips of water .  6. Plan to go home the same day, you will only stay overnight if medically necessary. 7. Bring a current list of your medications and current insurance cards. 8. You MUST have a responsible person to drive you home. 9. Someone MUST be with you the first 24 hours after you arrive home or your discharge will be delayed. 10. Please wear clothes that are easy to get on and off and wear slip-on shoes.  Thank you for allowing us  to care for you!   -- [facility]

## 2024-02-15 NOTE — Telephone Encounter (Signed)
 No answer

## 2024-02-15 NOTE — Telephone Encounter (Signed)
 Called patient's wife. She states she will be able to assist with setting up Right heart cath.   She states any day after 10 /3/25. And the sooner the better. Patient would like th have knee surgery.

## 2024-02-15 NOTE — Telephone Encounter (Signed)
-----   Message from Joelle VEAR Cedars Tonleu sent at 02/12/2024  3:52 PM EDT ----- Regarding: Schedule RHC Hi,       Please schedule RHC for Mr Urton. Let me know if you need anything from me.  Thank you Joelle ----- Message ----- From: Interface, [employer] One Seven Sent: 02/10/2024   4:55 PM EDT To: Joelle VEAR Cedars Donley, MD

## 2024-02-16 ENCOUNTER — Telehealth: Payer: Self-pay | Admitting: *Deleted

## 2024-02-16 NOTE — Telephone Encounter (Signed)
 Call to patient to review instructions, no answer, voicemail message.

## 2024-02-16 NOTE — Telephone Encounter (Addendum)
 Right Heart Cath scheduled at [facility] for: Monday February 20, 2024 7:30 AM Arrival time [facility] Main Entrance A at: 5:30 AM  Diet: -Nothing to eat after midnight.  Hydration: -May drink clear liquids until 2 hours before the procedure.  Approved liquids: Water , clear tea, black coffee, fruit juices-non-citric and without pulp,Gatorade, plain Jello/popsicles.  Medication instructions: -Hold:  Lisinopril /HCT/KCl-AM of procedure -Other usual morning medications can be taken.  Plan to go home the same day, you will only stay overnight if medically necessary.  You must have responsible adult to drive you home.  Someone must be with you the first 24 hours after you arrive home.  Left message for patient to call back to review instructions

## 2024-02-16 NOTE — Telephone Encounter (Signed)
 Reviewed procedure instructions with patient's wife (DPR), [place]. Velva tells me patient has not had CBC done recently, will be unable to have CBC done prior to RHC 10/6 , so will plan to get CBC on arrival to Short Stay 02/20/24.

## 2024-02-17 ENCOUNTER — Telehealth: Payer: Self-pay

## 2024-02-17 NOTE — Telephone Encounter (Signed)
 Contacted by Dr. Ren. Pt is needing a RHC. Left message for patient to return call to be scheduled.

## 2024-02-17 NOTE — Telephone Encounter (Signed)
 Usually the doctors do that and I am not sure how to exactly place cath orders.

## 2024-02-20 ENCOUNTER — Ambulatory Visit: Payer: Self-pay | Admitting: Nurse Practitioner

## 2024-02-20 ENCOUNTER — Ambulatory Visit (HOSPITAL_COMMUNITY)
Admission: RE | Admit: 2024-02-20 | Discharge: 2024-02-20 | Disposition: A | Discharge status: Home / Self Care | Attending: Internal Medicine | Admitting: Internal Medicine

## 2024-02-20 ENCOUNTER — Other Ambulatory Visit: Payer: Self-pay

## 2024-02-20 ENCOUNTER — Encounter (HOSPITAL_COMMUNITY)
Admission: RE | Disposition: A | Discharge status: Home / Self Care | Payer: Self-pay | Source: Home / Self Care | Attending: Internal Medicine

## 2024-02-20 DIAGNOSIS — F17210 Nicotine dependence, cigarettes, uncomplicated: Secondary | ICD-10-CM | POA: Diagnosis not present

## 2024-02-20 DIAGNOSIS — E079 Disorder of thyroid, unspecified: Secondary | ICD-10-CM | POA: Insufficient documentation

## 2024-02-20 DIAGNOSIS — E785 Hyperlipidemia, unspecified: Secondary | ICD-10-CM | POA: Diagnosis not present

## 2024-02-20 DIAGNOSIS — Z79899 Other long term (current) drug therapy: Secondary | ICD-10-CM | POA: Insufficient documentation

## 2024-02-20 DIAGNOSIS — I509 Heart failure, unspecified: Secondary | ICD-10-CM

## 2024-02-20 DIAGNOSIS — Z01812 Encounter for preprocedural laboratory examination: Secondary | ICD-10-CM

## 2024-02-20 DIAGNOSIS — I10 Essential (primary) hypertension: Secondary | ICD-10-CM | POA: Insufficient documentation

## 2024-02-20 DIAGNOSIS — I272 Pulmonary hypertension, unspecified: Secondary | ICD-10-CM | POA: Diagnosis not present

## 2024-02-20 DIAGNOSIS — I517 Cardiomegaly: Secondary | ICD-10-CM

## 2024-02-20 HISTORY — PX: RIGHT HEART CATH: CATH118263

## 2024-02-20 LAB — POCT I-STAT EG7
Acid-Base Excess: 0 mmol/L (ref 0.0–2.0)
Acid-Base Excess: 0 mmol/L (ref 0.0–2.0)
Acid-Base Excess: 1 mmol/L (ref 0.0–2.0)
Acid-Base Excess: 1 mmol/L (ref 0.0–2.0)
Acid-Base Excess: 1 mmol/L (ref 0.0–2.0)
Acid-Base Excess: 1 mmol/L (ref 0.0–2.0)
Acid-Base Excess: 3 mmol/L — ABNORMAL HIGH (ref 0.0–2.0)
Bicarbonate: 25.8 mmol/L (ref 20.0–28.0)
Bicarbonate: 26.1 mmol/L (ref 20.0–28.0)
Bicarbonate: 26.6 mmol/L (ref 20.0–28.0)
Bicarbonate: 26.6 mmol/L (ref 20.0–28.0)
Bicarbonate: 26.8 mmol/L (ref 20.0–28.0)
Bicarbonate: 26.9 mmol/L (ref 20.0–28.0)
Bicarbonate: 29.3 mmol/L — ABNORMAL HIGH (ref 20.0–28.0)
Calcium, Ion: 1.22 mmol/L (ref 1.15–1.40)
Calcium, Ion: 1.24 mmol/L (ref 1.15–1.40)
Calcium, Ion: 1.25 mmol/L (ref 1.15–1.40)
Calcium, Ion: 1.22 mmol/L (ref 1.15–1.40)
Calcium, Ion: 1.23 mmol/L (ref 1.15–1.40)
Calcium, Ion: 1.24 mmol/L (ref 1.15–1.40)
Calcium, Ion: 1.26 mmol/L (ref 1.15–1.40)
HCT: 39 % (ref 39.0–52.0)
HCT: 39 % (ref 39.0–52.0)
HCT: 40 % (ref 39.0–52.0)
HCT: 39 % (ref 39.0–52.0)
HCT: 39 % (ref 39.0–52.0)
HCT: 40 % (ref 39.0–52.0)
HCT: 40 % (ref 39.0–52.0)
Hemoglobin: 13.3 g/dL (ref 13.0–17.0)
Hemoglobin: 13.3 g/dL (ref 13.0–17.0)
Hemoglobin: 13.6 g/dL (ref 13.0–17.0)
Hemoglobin: 13.3 g/dL (ref 13.0–17.0)
Hemoglobin: 13.3 g/dL (ref 13.0–17.0)
Hemoglobin: 13.6 g/dL (ref 13.0–17.0)
Hemoglobin: 13.6 g/dL (ref 13.0–17.0)
O2 Saturation: 74 %
O2 Saturation: 75 %
O2 Saturation: 76 %
O2 Saturation: 45 %
O2 Saturation: 73 %
O2 Saturation: 73 %
O2 Saturation: 76 %
Potassium: 3.6 mmol/L (ref 3.5–5.1)
Potassium: 3.6 mmol/L (ref 3.5–5.1)
Potassium: 3.6 mmol/L (ref 3.5–5.1)
Potassium: 3.5 mmol/L (ref 3.5–5.1)
Potassium: 3.5 mmol/L (ref 3.5–5.1)
Potassium: 3.6 mmol/L (ref 3.5–5.1)
Potassium: 3.6 mmol/L (ref 3.5–5.1)
Sodium: 138 mmol/L (ref 135–145)
Sodium: 139 mmol/L (ref 135–145)
Sodium: 139 mmol/L (ref 135–145)
Sodium: 139 mmol/L (ref 135–145)
Sodium: 140 mmol/L (ref 135–145)
Sodium: 140 mmol/L (ref 135–145)
Sodium: 140 mmol/L (ref 135–145)
TCO2: 27 mmol/L (ref 22–32)
TCO2: 27 mmol/L (ref 22–32)
TCO2: 28 mmol/L (ref 22–32)
TCO2: 28 mmol/L (ref 22–32)
TCO2: 28 mmol/L (ref 22–32)
TCO2: 28 mmol/L (ref 22–32)
TCO2: 31 mmol/L (ref 22–32)
pCO2, Ven: 43.1 mmHg — ABNORMAL LOW (ref 44–60)
pCO2, Ven: 45 mmHg (ref 44–60)
pCO2, Ven: 45.7 mmHg (ref 44–60)
pCO2, Ven: 44.7 mmHg (ref 44–60)
pCO2, Ven: 45.6 mmHg (ref 44–60)
pCO2, Ven: 45.8 mmHg (ref 44–60)
pCO2, Ven: 51.9 mmHg (ref 44–60)
pH, Ven: 7.372 (ref 7.25–7.43)
pH, Ven: 7.373 (ref 7.25–7.43)
pH, Ven: 7.385 (ref 7.25–7.43)
pH, Ven: 7.36 (ref 7.25–7.43)
pH, Ven: 7.372 (ref 7.25–7.43)
pH, Ven: 7.379 (ref 7.25–7.43)
pH, Ven: 7.385 (ref 7.25–7.43)
pO2, Ven: 41 mmHg (ref 32–45)
pO2, Ven: 41 mmHg (ref 32–45)
pO2, Ven: 42 mmHg (ref 32–45)
pO2, Ven: 26 mmHg — CL (ref 32–45)
pO2, Ven: 40 mmHg (ref 32–45)
pO2, Ven: 40 mmHg (ref 32–45)
pO2, Ven: 42 mmHg (ref 32–45)

## 2024-02-20 LAB — CBC
HCT: 44.5 % (ref 39.0–52.0)
Hemoglobin: 14.8 g/dL (ref 13.0–17.0)
MCH: 28.5 pg (ref 26.0–34.0)
MCHC: 33.3 g/dL (ref 30.0–36.0)
MCV: 85.7 fL (ref 80.0–100.0)
Platelets: 294 K/uL (ref 150–400)
RBC: 5.19 MIL/uL (ref 4.22–5.81)
RDW: 14.2 % (ref 11.5–15.5)
WBC: 7 K/uL (ref 4.0–10.5)
nRBC: 0 % (ref 0.0–0.2)

## 2024-02-20 SURGERY — RIGHT HEART CATH
Anesthesia: LOCAL

## 2024-02-20 MED ORDER — LIDOCAINE HCL (PF) 1 % IJ SOLN
INTRAMUSCULAR | Status: AC
Start: 2024-02-20 — End: 2024-02-20
  Filled 2024-02-20: qty 30

## 2024-02-20 MED ORDER — SODIUM CHLORIDE 0.9 % IV SOLN: 250.0000 mL | INTRAVENOUS | Status: DC | PRN

## 2024-02-20 MED ORDER — LABETALOL HCL 5 MG/ML IV SOLN: 10.0000 mg | INTRAVENOUS | Status: DC | PRN

## 2024-02-20 MED ORDER — SODIUM CHLORIDE 0.9% FLUSH: 3.0000 mL | Freq: Two times a day (BID) | INTRAVENOUS | Status: DC

## 2024-02-20 MED ORDER — SODIUM CHLORIDE 0.9% FLUSH: 3.0000 mL | INTRAVENOUS | Status: DC | PRN

## 2024-02-20 MED ORDER — LIDOCAINE HCL (PF) 1 % IJ SOLN
INTRAMUSCULAR | Status: DC | PRN
  Administered 2024-02-20: 5 mL via INTRADERMAL

## 2024-02-20 MED ORDER — HYDRALAZINE HCL 20 MG/ML IJ SOLN: 10.0000 mg | INTRAMUSCULAR | Status: DC | PRN

## 2024-02-20 MED ORDER — ONDANSETRON HCL 4 MG/2ML IJ SOLN: 4.0000 mg | Freq: Four times a day (QID) | INTRAMUSCULAR | Status: DC | PRN

## 2024-02-20 MED ORDER — FREE WATER: 500.0000 mL | Freq: Once | Status: DC

## 2024-02-20 MED ORDER — HEPARIN (PORCINE) IN NACL 1000-0.9 UT/500ML-% IV SOLN
INTRAVENOUS | Status: DC | PRN
  Administered 2024-02-20: 500 mL

## 2024-02-20 MED ORDER — ACETAMINOPHEN 325 MG PO TABS: 650.0000 mg | ORAL_TABLET | ORAL | Status: DC | PRN

## 2024-02-20 SURGICAL SUPPLY — 7 items
CATH BALLN WEDGE 5F 110CM (CATHETERS) IMPLANT
GUIDEWIRE INQWIRE 1.5J.035X260 (WIRE) IMPLANT
KIT SYRINGE INJ CVI SPIKEX1 (MISCELLANEOUS) IMPLANT
SHEATH GLIDE SLENDER 4/5FR (SHEATH) IMPLANT
TRANSDUCER W/MONITORING KIT (MISCELLANEOUS) IMPLANT
TUBING ART PRESS 72 MALE/FEM (TUBING) IMPLANT
WIRE EMERALD 3MM-J .025X260CM (WIRE) IMPLANT

## 2024-02-20 NOTE — Interval H&P Note (Signed)
 History and Physical Interval Note:  02/20/2024 7:12 AM  Steven Gibson  has presented today for surgery, with the diagnosis of enlarged right ventricle.  The various methods of treatment have been discussed with the patient and family. After consideration of risks, benefits and other options for treatment, the patient has consented to  Procedure(s): RIGHT HEART CATH (N/A) as a surgical intervention.  The patient's history has been reviewed, patient examined, no change in status, stable for surgery.  I have reviewed the patient's chart and labs.  Questions were answered to the patient's satisfaction.     Hollyann Pablo

## 2024-02-20 NOTE — Discharge Instructions (Signed)

## 2024-02-21 ENCOUNTER — Other Ambulatory Visit (HOSPITAL_COMMUNITY): Payer: Self-pay

## 2024-02-21 ENCOUNTER — Encounter (HOSPITAL_COMMUNITY): Payer: Self-pay | Admitting: Internal Medicine

## 2024-02-25 ENCOUNTER — Ambulatory Visit: Payer: Self-pay

## 2024-02-27 ENCOUNTER — Other Ambulatory Visit (HOSPITAL_COMMUNITY): Payer: Self-pay

## 2024-03-05 NOTE — Progress Notes (Signed)
 Sent message, via epic in basket, requesting orders in epic from Careers adviser.

## 2024-03-11 NOTE — Progress Notes (Signed)
 COVID Vaccine received:  []  No [x]  Yes Date of any COVID positive Test in last 90 days:  PCP -  [name], FNP  Cardiologist - Joelle Ren Ny MD  Chest x-ray -  EKG -  01-30-2024  Stress Test -  ECHO - 02-10-2024 Cardiac Cath - 02-20-24   RHC by Dr. Mady  for Right ventricular enlargement CT Coronary Calcium  score:   Pacemaker / ICD device [x]  No []  Yes   Spinal Cord Stimulator:[x]  No []  Yes       History of Sleep Apnea? [x]  No []  Yes   CPAP used?- [x]  No []  Yes    Patient has: []  NO Hx DM   [x]  Pre-DM   []  DM1  []   DM2 Does the patient monitor blood sugar?   []  N/A   [x]  No []  Yes  Last A1c was:    6.2    on  11-10-23   no Meds     Blood Thinner / Instructions:  none Aspirin  Instructions:  none  Dental hx: []  Dentures:  []  N/A      []  Bridge or Partial:                   []  Loose or Damaged teeth:   Comments:   Activity level: Able to walk up 2 flights of stairs without becoming significantly short of breath or having chest pain?  []  No   []    Yes  Patient can perform ADLs without assistance. []  No   []   Yes  Anesthesia review: HTN, Right ventricular hypertrophy ( had negative RHC on 02-20-24 by Dr. Mady) Pre-DM, ADHD, Hx Graves' Disease, smoker  Patient denies any S&S of respiratory illness or Covid - no shortness of breath, fever, cough or chest pain at PAT appointment.  Patient verbalized understanding and agreement to the Pre-Surgical Instructions that were given to them at this PAT appointment. Patient was also educated of the need to review these PAT instructions again prior to his surgery.I reviewed the appropriate phone numbers to call if they have any and questions or concerns.

## 2024-03-11 NOTE — Patient Instructions (Signed)
 SURGICAL WAITING ROOM VISITATION Patients having surgery or a procedure may have no more than 2 support people in the waiting area - these visitors may rotate in the visitor waiting room.   If the patient needs to stay at the hospital during part of their recovery, the visitor guidelines for inpatient rooms apply.  PRE-OP VISITATION  Pre-op nurse will coordinate an appropriate time for 1 support person to accompany the patient in pre-op.  This support person may not rotate.  This visitor will be contacted when the time is appropriate for the visitor to come back in the pre-op area.  Please refer to the [facility] website for the visitor guidelines for Inpatients (after your surgery is over and you are in a regular room).  You are not required to quarantine at this time prior to your surgery. However, you must do this: Hand Hygiene often Do NOT share personal items Notify your provider if you are in close contact with someone who has COVID or you develop fever 100.4 or greater, new onset of sneezing, cough, sore throat, shortness of breath or body aches.  If you test positive for Covid or have been in contact with anyone that has tested positive in the last 10 days please notify you surgeon.    Your procedure is scheduled on:  Wednesday  March 21, 2024   Report to [facility] Main Entrance: [address] entrance where the [employer] is available.   Report to admitting at: 08:45  AM  Call this number if you have any questions or problems the morning of surgery [phone]  Do not eat food after Midnight the night prior to your surgery/procedure.  After Midnight you may have the following liquids until  08:15 ???? AM DAY OF SURGERY  Clear Liquid Diet Water  Black Coffee (sugar ok, NO MILK/CREAM OR CREAMERS)  Tea (sugar ok, NO MILK/CREAM OR CREAMERS) regular and decaf                             Plain Jell-O  with no fruit (NO RED)                                            Fruit ices (not with fruit pulp, NO RED)                                     Popsicles (NO RED)                                                                  Juice: NO CITRUS JUICES: only apple, WHITE grape, WHITE cranberry Sports drinks like Gatorade or Powerade (NO RED)                 FOLLOW ANY ADDITIONAL PRE OP INSTRUCTIONS YOU RECEIVED FROM YOUR SURGEON'S OFFICE!!!   Oral Hygiene is also important to reduce your risk of infection.        Remember - BRUSH YOUR TEETH THE MORNING OF SURGERY WITH YOUR REGULAR TOOTHPASTE  Do  NOT smoke after Midnight the night before surgery.  STOP TAKING all Vitamins, Herbs and supplements 1 week before your surgery.   Take ONLY these medicines the morning of surgery with A SIP OF WATER : Methimazole , amlodipine , Escitalopram  and Hydralazine   ???  DO NOT TAKE ADDERALL or LISINOPRIL -HCTZ the morning of surgery.                 You may not have any metal on your body including  jewelry, and body piercing  Do not wear  lotions, powders,cologne, or deodorant  Men may shave face and neck.  Contacts, Hearing Aids, dentures or bridgework may not be worn into surgery. DENTURES WILL BE REMOVED PRIOR TO SURGERY PLEASE DO NOT APPLY Poly grip OR ADHESIVES!!!  Patients discharged on the day of surgery will not be allowed to drive home.  Someone NEEDS to stay with you for the first 24 hours after anesthesia.  Do not bring your home medications to the hospital. The Pharmacy will dispense medications listed on your medication list to you during your admission in the Hospital.  Special Instructions: Bring a copy of your healthcare power of attorney and living will documents the day of surgery, if you wish to have them scanned into your [facility] Records- EPIC  Please read over the following fact sheets you were given: IF YOU HAVE QUESTIONS ABOUT YOUR PRE-OP INSTRUCTIONS, PLEASE CALL [phone].      Pre-operative 4 CHG Bath Instructions    You can play a key role in reducing the risk of infection after surgery. Your skin needs to be as free of germs as possible. You can reduce the number of germs on your skin by washing with CHG (chlorhexidine  gluconate) soap before surgery. CHG is an antiseptic soap that kills germs and continues to kill germs even after washing.   DO NOT use if you have an allergy to chlorhexidine /CHG or antibacterial soaps. If your skin becomes reddened or irritated, stop using the CHG and notify one of our RNs at   Please shower with the CHG soap starting 4 days before surgery using the following schedule:  Saturday November 05-2023    Please keep in mind the following:  DO NOT shave, including legs and underarms, starting the day of your first shower.   You may shave your face at any point before/day of surgery.  Place clean sheets on your bed the day you start using CHG soap. Use a clean washcloth (not used since being washed) for each shower. DO NOT sleep with pets once you start using the CHG.  CHG Shower Instructions:  If you choose to wash your hair and private area, wash first with your normal shampoo/soap.  After you use shampoo/soap, rinse your hair and body thoroughly to remove shampoo/soap residue.  Turn the water  OFF and apply about 3 tablespoons (45 ml) of CHG soap to a CLEAN washcloth.  Apply CHG soap ONLY FROM YOUR NECK DOWN TO YOUR TOES (washing for 3-5 minutes)  DO NOT use CHG soap on face, private areas, open wounds, or sores.  Pay special attention to the area where your surgery is being performed.  If you are having back surgery, having someone wash your back for you may be helpful. Wait 2 minutes after CHG soap is applied, then you may rinse off the CHG soap.  Pat dry with a clean towel  Put on clean clothes/pajamas   If you choose to wear lotion, please use ONLY the CHG-compatible lotions on  the back of this paper.     Additional instructions for the day of surgery: DO NOT APPLY  any CHG Soap,  lotions, deodorants, cologne, or perfumes on the day of surgery  Put on clean/comfortable clothes.  Brush your teeth.  Ask your nurse before applying any prescription medications to the skin.   CHG Compatible Lotions   Aveeno Moisturizing lotion  Cetaphil Moisturizing Cream  Cetaphil Moisturizing Lotion  Clairol Herbal Essence Moisturizing Lotion, Dry Skin  Clairol Herbal Essence Moisturizing Lotion, Extra Dry Skin  Clairol Herbal Essence Moisturizing Lotion, Normal Skin  Curel Age Defying Therapeutic Moisturizing Lotion with Alpha Hydroxy  Curel Extreme Care Body Lotion  Curel Soothing Hands Moisturizing Hand Lotion  Curel Therapeutic Moisturizing Cream, Fragrance-Free  Curel Therapeutic Moisturizing Lotion, Fragrance-Free  Curel Therapeutic Moisturizing Lotion, Original Formula  Eucerin Daily Replenishing Lotion  Eucerin Dry Skin Therapy Plus Alpha Hydroxy Crme  Eucerin Dry Skin Therapy Plus Alpha Hydroxy Lotion  Eucerin Original Crme  Eucerin Original Lotion  Eucerin Plus Crme Eucerin Plus Lotion  Eucerin TriLipid Replenishing Lotion  Keri Anti-Bacterial Hand Lotion  Keri Deep Conditioning Original Lotion Dry Skin Formula Softly Scented  Keri Deep Conditioning Original Lotion, Fragrance Free Sensitive Skin Formula  Keri Lotion Fast Absorbing Fragrance Free Sensitive Skin Formula  Keri Lotion Fast Absorbing Softly Scented Dry Skin Formula  Keri Original Lotion  Keri Skin Renewal Lotion Keri Silky Smooth Lotion  Keri Silky Smooth Sensitive Skin Lotion  Nivea Body Creamy Conditioning Oil  Nivea Body Extra Enriched Lotion  Nivea Body Original Lotion  Nivea Body Sheer Moisturizing Lotion Nivea Crme  Nivea Skin Firming Lotion  NutraDerm 30 Skin Lotion  NutraDerm Skin Lotion  NutraDerm Therapeutic Skin Cream  NutraDerm Therapeutic Skin Lotion  ProShield Protective Hand Cream  Provon moisturizing lotion   FAILURE TO FOLLOW THESE INSTRUCTIONS MAY RESULT IN  THE CANCELLATION OF YOUR SURGERY  PATIENT SIGNATURE_________________________________  NURSE SIGNATURE__________________________________  ________________________________________________________________________         Steven Gibson    An incentive spirometer is a tool that can help keep your lungs clear and active. This tool measures how well you are filling your lungs with each breath. Taking long deep breaths may help reverse or decrease the chance of developing breathing (pulmonary) problems (especially infection) following: A long period of time when you are unable to move or be active. BEFORE THE PROCEDURE  If the spirometer includes an indicator to show your best effort, your nurse or respiratory therapist will set it to a desired goal. If possible, sit up straight or lean slightly forward. Try not to slouch. Hold the incentive spirometer in an upright position. INSTRUCTIONS FOR USE  Sit on the edge of your bed if possible, or sit up as far as you can in bed or on a chair. Hold the incentive spirometer in an upright position. Breathe out normally. Place the mouthpiece in your mouth and seal your lips tightly around it. Breathe in slowly and as deeply as possible, raising the piston or the ball toward the top of the column. Hold your breath for 3-5 seconds or for as long as possible. Allow the piston or ball to fall to the bottom of the column. Remove the mouthpiece from your mouth and breathe out normally. Rest for a few seconds and repeat Steps 1 through 7 at least 10 times every 1-2 hours when you are awake. Take your time and take a few normal breaths between deep breaths. The spirometer may include an indicator  to show your best effort. Use the indicator as a goal to work toward during each repetition. After each set of 10 deep breaths, practice coughing to be sure your lungs are clear. If you have an incision (the cut made at the time of surgery), support your  incision when coughing by placing a pillow or rolled up towels firmly against it. Once you are able to get out of bed, walk around indoors and cough well. You may stop using the incentive spirometer when instructed by your caregiver.  RISKS AND COMPLICATIONS Take your time so you do not get dizzy or light-headed. If you are in pain, you may need to take or ask for pain medication before doing incentive spirometry. It is harder to take a deep breath if you are having pain. AFTER USE Rest and breathe slowly and easily. It can be helpful to keep track of a log of your progress. Your caregiver can provide you with a simple table to help with this. If you are using the spirometer at home, follow these instructions: SEEK MEDICAL CARE IF:  You are having difficultly using the spirometer. You have trouble using the spirometer as often as instructed. Your pain medication is not giving enough relief while using the spirometer. You develop fever of 100.5 F (38.1 C) or higher.                                                                                                    SEEK IMMEDIATE MEDICAL CARE IF:  You cough up bloody sputum that had not been present before. You develop fever of 102 F (38.9 C) or greater. You develop worsening pain at or near the incision site. MAKE SURE YOU:  Understand these instructions. Will watch your condition. Will get help right away if you are not doing well or get worse. Document Released: 09/13/2006 Document Revised: 07/26/2011 Document Reviewed: 11/14/2006 [facility] Patient Information 2014 [place], [place].       If you would like to see a video about joint replacement:   [email or web address]

## 2024-03-12 ENCOUNTER — Encounter (HOSPITAL_COMMUNITY)
Admission: RE | Admit: 2024-03-12 | Discharge: 2024-03-12 | Disposition: A | Discharge status: Home / Self Care | Source: Ambulatory Visit | Attending: Orthopedic Surgery | Admitting: Orthopedic Surgery

## 2024-03-12 ENCOUNTER — Encounter (HOSPITAL_COMMUNITY): Payer: Self-pay

## 2024-03-12 ENCOUNTER — Ambulatory Visit: Payer: Self-pay | Admitting: Emergency Medicine

## 2024-03-12 ENCOUNTER — Other Ambulatory Visit: Payer: Self-pay

## 2024-03-12 VITALS — BP 155/98 | HR 94 | Temp 98.1°F | Resp 20 | Ht 74.0 in | Wt 237.0 lb

## 2024-03-12 DIAGNOSIS — Z79899 Other long term (current) drug therapy: Secondary | ICD-10-CM | POA: Insufficient documentation

## 2024-03-12 DIAGNOSIS — E0500 Thyrotoxicosis with diffuse goiter without thyrotoxic crisis or storm: Secondary | ICD-10-CM | POA: Diagnosis not present

## 2024-03-12 DIAGNOSIS — Z01812 Encounter for preprocedural laboratory examination: Secondary | ICD-10-CM | POA: Insufficient documentation

## 2024-03-12 DIAGNOSIS — R7303 Prediabetes: Secondary | ICD-10-CM | POA: Diagnosis not present

## 2024-03-12 DIAGNOSIS — G8929 Other chronic pain: Secondary | ICD-10-CM

## 2024-03-12 DIAGNOSIS — F17290 Nicotine dependence, other tobacco product, uncomplicated: Secondary | ICD-10-CM | POA: Diagnosis not present

## 2024-03-12 DIAGNOSIS — I119 Hypertensive heart disease without heart failure: Secondary | ICD-10-CM | POA: Insufficient documentation

## 2024-03-12 DIAGNOSIS — M1611 Unilateral primary osteoarthritis, right hip: Secondary | ICD-10-CM | POA: Diagnosis not present

## 2024-03-12 DIAGNOSIS — I493 Ventricular premature depolarization: Secondary | ICD-10-CM | POA: Diagnosis not present

## 2024-03-12 DIAGNOSIS — Z01818 Encounter for other preprocedural examination: Secondary | ICD-10-CM

## 2024-03-12 DIAGNOSIS — M25561 Pain in right knee: Secondary | ICD-10-CM | POA: Diagnosis not present

## 2024-03-12 HISTORY — DX: Heart disease, unspecified: I51.9

## 2024-03-12 HISTORY — DX: Prediabetes: R73.03

## 2024-03-12 HISTORY — DX: Thyrotoxicosis, unspecified without thyrotoxic crisis or storm: E05.90

## 2024-03-12 HISTORY — DX: Attention-deficit hyperactivity disorder, unspecified type: F90.9

## 2024-03-12 LAB — SURGICAL PCR SCREEN
MRSA, PCR: NEGATIVE
Staphylococcus aureus: NEGATIVE

## 2024-03-12 LAB — COMPREHENSIVE METABOLIC PANEL WITH GFR
ALT: 33 U/L (ref 0–44)
AST: 39 U/L (ref 15–41)
Albumin: 4.6 g/dL (ref 3.5–5.0)
Alkaline Phosphatase: 163 U/L — ABNORMAL HIGH (ref 38–126)
Anion gap: 10 (ref 5–15)
BUN: 15 mg/dL (ref 6–20)
CO2: 27 mmol/L (ref 22–32)
Calcium: 10.2 mg/dL (ref 8.9–10.3)
Chloride: 104 mmol/L (ref 98–111)
Creatinine, Ser: 0.92 mg/dL (ref 0.61–1.24)
GFR, Estimated: >60 mL/min (ref >60)
Glucose, Bld: 110 mg/dL — ABNORMAL HIGH (ref 70–99)
Potassium: 3.9 mmol/L (ref 3.5–5.1)
Sodium: 141 mmol/L (ref 135–145)
Total Bilirubin: 0.3 mg/dL (ref 0.0–1.2)
Total Protein: 7.9 g/dL (ref 6.5–8.1)

## 2024-03-12 LAB — CBC WITH DIFFERENTIAL/PLATELET
Abs Immature Granulocytes: 0.06 K/uL (ref 0.00–0.07)
Basophils Absolute: 0.1 K/uL (ref 0.0–0.1)
Basophils Relative: 1 %
Eosinophils Absolute: 0.6 K/uL — ABNORMAL HIGH (ref 0.0–0.5)
Eosinophils Relative: 8 %
HCT: 48.1 % (ref 39.0–52.0)
Hemoglobin: 15.2 g/dL (ref 13.0–17.0)
Immature Granulocytes: 1 %
Lymphocytes Relative: 36 %
Lymphs Abs: 2.9 K/uL (ref 0.7–4.0)
MCH: 27.4 pg (ref 26.0–34.0)
MCHC: 31.6 g/dL (ref 30.0–36.0)
MCV: 86.8 fL (ref 80.0–100.0)
Monocytes Absolute: 0.8 K/uL (ref 0.1–1.0)
Monocytes Relative: 10 %
Neutro Abs: 3.6 K/uL (ref 1.7–7.7)
Neutrophils Relative %: 44 %
Platelets: 377 K/uL (ref 150–400)
RBC: 5.54 MIL/uL (ref 4.22–5.81)
RDW: 14.4 % (ref 11.5–15.5)
WBC: 8.1 K/uL (ref 4.0–10.5)
nRBC: 0 % (ref 0.0–0.2)

## 2024-03-12 NOTE — H&P (View-Only) (Signed)
 PARTIAL VS TOTAL KNEE ADMISSION H&P  Patient is being admitted for right partial knee arthroplasty.  Subjective:  Chief Complaint:right knee pain.  HPI: Steven Gibson, 53 y.o. male, has a history of pain and functional disability in the right knee due to arthritis and has failed non-surgical conservative treatments for greater than 12 weeks to includeNSAID's and/or analgesics, corticosteriod injections, supervised PT with diminished ADL's post treatment, use of assistive devices, and activity modification.  Onset of symptoms was gradual, starting almost 1 years ago with gradually worsening course since that time. The patient noted prior procedures on the knee to include  arthroscopy on the right knee(s).  Patient currently rates pain in the right knee(s) at 9 out of 10 with activity. Patient has night pain, worsening of pain with activity and weight bearing, pain that interferes with activities of daily living, and pain with passive range of motion.  Patient has evidence of periarticular osteophytes and joint space narrowing by imaging studies.  There is no active infection.  Patient Active Problem List   Diagnosis Date Noted   Abnormal EKG 01/30/2024   Need for influenza vaccination 01/30/2024   Pre-op exam 01/30/2024   Class 1 obesity due to excess calories with serious comorbidity and body mass index (BMI) of 32.0 to 32.9 in adult 08/30/2023   Seasonal allergies 08/30/2023   Acute cough 08/30/2023   Rash and nonspecific skin eruption 04/13/2023   Encounter for annual health examination 04/07/2023   COVID-19 vaccination declined 04/07/2023   Influenza vaccination declined 04/07/2023   Herpes zoster vaccination declined 04/07/2023   Encounter for prostate cancer screening 04/07/2023   Impacted cerumen of right ear 04/07/2023   Arthritis 09/01/2022   Prediabetes 05/03/2022   Class 1 obesity due to excess calories with serious comorbidity and body mass index (BMI) of 31.0 to 31.9  in adult 05/03/2022   Complication associated with orthopedic device 02/10/2022   Mood disorder 08/18/2021   Osteoarthritis of left knee 06/04/2021   S/P total knee arthroplasty, left 05/06/2020   Eczema 04/17/2019   Mixed hyperlipidemia 08/02/2018   Numbness and tingling in both hands 08/02/2018   Essential hypertension 07/24/2018   H/O Graves' disease 07/24/2018   Attention deficit disorder 07/24/2018   Dyslipidemia 03/26/2016   Family history of premature CAD 03/26/2016   Tobacco abuse 08/13/2014   GERD without esophagitis 08/13/2014   Lumbar herniated disc 08/13/2014   Past Medical History:  Diagnosis Date   Anxiety    Arthritis    Arthrofibrosis of knee joint, left    post op TKA    History of Graves' disease 2008   s/p  RAI  same year , on medication for a year then stablized no meds since  (per pt followed by pcp)   Hyperlipidemia    Hypertension    followed by pcp   (in care everywhere pt had stress echo, 03-26-2016, negative for ishemia and echo normal)   Thyroid  disease    Wears glasses     Past Surgical History:  Procedure Laterality Date   ANTERIOR CRUCIATE LIGAMENT REPAIR Left 1990S   HERNIA REPAIR     JOINT REPLACEMENT  05/06/2020   KNEE ARTHROSCOPY W/ MEDIAL COLLATERAL LIGAMENT (MCL) REPAIR Left 1990s   KNEE ARTHROSCOPY W/ MENISCAL REPAIR Left 1990s   KNEE CLOSED REDUCTION Left 10/21/2020   Procedure: CLOSED MANIPULATION LEFT KNEE;  Surgeon: Josefina Chew, MD;  Location: [facility];  Service: Orthopedics;  Laterality: Left;   LIPOMA EXCISION  LEF SHOULDER AREA   RIGHT HEART CATH N/A 02/20/2024   Procedure: RIGHT HEART CATH;  Surgeon: Mady Bruckner, MD;  Location: [facility] INVASIVE CV LAB;  Service: Cardiovascular;  Laterality: N/A;   TOTAL KNEE ARTHROPLASTY Left 05/06/2020   Procedure: TOTAL KNEE ARTHROPLASTY;  Surgeon: Josefina Chew, MD;  Location: [facility];  Service: Orthopedics;  Laterality: Left;   UMBILICAL HERNIA REPAIR  07-17-2015   @[email or web address]    AND RIGHT ABDOMINAL WALL EXCISION LIPOMA    Current Outpatient Medications  Medication Sig Dispense Refill Last Dose/Taking   amLODipine  (NORVASC ) 10 MG tablet Take 1 tablet (10 mg total) by mouth daily. 90 tablet 1    amphetamine -dextroamphetamine  (ADDERALL) 15 MG tablet Take 1 tablet by mouth 3 (three) times daily. 90 tablet 0    [START ON 03/13/2024] amphetamine -dextroamphetamine  (ADDERALL) 15 MG tablet Take 1 tablet by mouth 3 (three) times daily. 90 tablet 0    Azelastine  HCl 137 MCG/SPRAY SOLN Place 2 sprays into the nose daily. (Patient not taking: Reported on 02/17/2024) 30 mL 2    escitalopram  (LEXAPRO ) 20 MG tablet Take 1 tablet (20 mg total) by mouth daily. 90 tablet 1    hydrALAZINE  (APRESOLINE ) 25 MG tablet TAKE 1 TABLET TWICE DAILY, AM & NIGHT 180 tablet 1    lisinopril -hydrochlorothiazide  (ZESTORETIC ) 20-25 MG tablet Take 1 tablet by mouth daily. 90 tablet 1    meloxicam  (MOBIC ) 15 MG tablet TAKE 1 TABLET (15 MG TOTAL) BY MOUTH DAILY. 90 tablet 1    methimazole  (TAPAZOLE ) 5 MG tablet Take 1 tablet (5 mg total) by mouth as directed. 1 tablet Monday through Saturday and none on Sundays 78 tablet 3    methocarbamol  (ROBAXIN ) 500 MG tablet Take 500-1,000 mg by mouth every 6 (six) hours as needed.      Potassium Chloride  (K+ POTASSIUM PO) Take 99 mg by mouth daily. Over the counter, per pt takes for leg cramps      rosuvastatin  (CRESTOR ) 20 MG tablet Take 1 tablet (20 mg total) by mouth daily. 90 tablet 3    No current facility-administered medications for this visit.   No Known Allergies  Social History   Tobacco Use   Smoking status: Every Day    Current packs/day: 0.50    Average packs/day: 0.5 packs/day for 25.0 years (12.5 ttl pk-yrs)    Types: Cigarettes   Smokeless tobacco: Former    Quit date: 10/20/1988  Substance Use Topics   Alcohol use: Yes    Comment: occasional    Family History  Problem Relation Age of Onset   Hypertension Mother     Hypertension Father    Hypertension Brother    Cancer Paternal Grandfather    Hypertension Brother      Review of Systems  Musculoskeletal:  Positive for arthralgias.  All other systems reviewed and are negative.   Objective:  Physical Exam Constitutional:      General: He is not in acute distress.    Appearance: Normal appearance. He is normal weight.  HENT:     Head: Normocephalic and atraumatic.  Eyes:     Extraocular Movements: Extraocular movements intact.     Conjunctiva/sclera: Conjunctivae normal.     Pupils: Pupils are equal, round, and reactive to light.  Cardiovascular:     Rate and Rhythm: Normal rate and regular rhythm.     Pulses: Normal pulses.     Heart sounds: Normal heart sounds.  Pulmonary:     Effort: Pulmonary effort is normal. No respiratory distress.  Breath sounds: Normal breath sounds.  Abdominal:     General: Bowel sounds are normal. There is no distension.     Palpations: Abdomen is soft.     Tenderness: There is no abdominal tenderness.  Musculoskeletal:        General: Tenderness present.     Cervical back: Normal range of motion and neck supple.     Comments: TTP over medial joint line, none on lateral.  No calf tenderness, swelling, or erythema.  No overlying lesions of area of chief complaint.  Decreased strength and ROM due to elicited pain.  Dorsiflexion and plantarflexion intact.  Stable to varus and valgus stress.  BLE appear grossly neurovascularly intact.  Gait mildly antalgic.   Lymphadenopathy:     Cervical: No cervical adenopathy.  Skin:    General: Skin is warm and dry.     Capillary Refill: Capillary refill takes less than 2 seconds.     Findings: No erythema or rash.  Neurological:     General: No focal deficit present.     Mental Status: He is alert and oriented to person, place, and time.  Psychiatric:        Mood and Affect: Mood normal.        Behavior: Behavior normal.     Vital signs in last 24  hours: @VSRANGES @  Labs:   Estimated body mass index is 30.56 kg/m as calculated from the following:   Height as of 02/20/24: 6' 2 (1.88 m).   Weight as of 02/20/24: 108 kg.   Imaging Review Plain radiographs demonstrate severe degenerative joint disease of the right knee(s) appearing isolated to the medial compartment. The overall alignment issignificant varus. The bone quality appears to be fair for age and reported activity level.      Assessment/Plan:  End stage arthritis, right knee, medial compartment  The patient history, physical examination, clinical judgment of the provider and imaging studies are consistent with end stage degenerative joint disease of the right knee(s) and partial knee arthroplasty is deemed medically necessary. The treatment options including medical management, injection therapy arthroscopy and arthroplasty were discussed at length. The risks and benefits of total knee arthroplasty were presented and reviewed including the possibility to transition plan from partial to total knee dependent on surgical findings. The risks due to aseptic loosening, infection, stiffness, patella tracking problems, thromboembolic complications and other imponderables were discussed. The patient acknowledged the explanation, agreed to proceed with the plan and consent was signed. Patient is being admitted for inpatient treatment for surgery, pain control, PT, OT, prophylactic antibiotics, VTE prophylaxis, progressive ambulation and ADL's and discharge planning. The patient is planning to be discharged home to OPPT     Patient's anticipated LOS is less than 2 midnights, meeting these requirements: - Younger than 34 - Lives within 1 hour of care - Has a competent adult at home to recover with post-op recover - NO history of  - Chronic pain requiring opiods  - Diabetes  - Coronary Artery Disease  - Heart failure  - Heart attack  - Stroke  - DVT/VTE  - Cardiac  arrhythmia  - Respiratory Failure/COPD  - Renal failure  - Anemia  - Advanced Liver disease

## 2024-03-12 NOTE — H&P (Addendum)
 PARTIAL VS TOTAL KNEE ADMISSION H&P  Patient is being admitted for right partial knee arthroplasty.  Subjective:  Chief Complaint:right knee pain.  HPI: Steven Gibson, 53 y.o. male, has a history of pain and functional disability in the right knee due to arthritis and has failed non-surgical conservative treatments for greater than 12 weeks to includeNSAID's and/or analgesics, corticosteriod injections, supervised PT with diminished ADL's post treatment, use of assistive devices, and activity modification.  Onset of symptoms was gradual, starting almost 1 years ago with gradually worsening course since that time. The patient noted prior procedures on the knee to include  arthroscopy on the right knee(s).  Patient currently rates pain in the right knee(s) at 9 out of 10 with activity. Patient has night pain, worsening of pain with activity and weight bearing, pain that interferes with activities of daily living, and pain with passive range of motion.  Patient has evidence of periarticular osteophytes and joint space narrowing by imaging studies.  There is no active infection.  Patient Active Problem List   Diagnosis Date Noted   Abnormal EKG 01/30/2024   Need for influenza vaccination 01/30/2024   Pre-op exam 01/30/2024   Class 1 obesity due to excess calories with serious comorbidity and body mass index (BMI) of 32.0 to 32.9 in adult 08/30/2023   Seasonal allergies 08/30/2023   Acute cough 08/30/2023   Rash and nonspecific skin eruption 04/13/2023   Encounter for annual health examination 04/07/2023   COVID-19 vaccination declined 04/07/2023   Influenza vaccination declined 04/07/2023   Herpes zoster vaccination declined 04/07/2023   Encounter for prostate cancer screening 04/07/2023   Impacted cerumen of right ear 04/07/2023   Arthritis 09/01/2022   Prediabetes 05/03/2022   Class 1 obesity due to excess calories with serious comorbidity and body mass index (BMI) of 31.0 to 31.9  in adult 05/03/2022   Complication associated with orthopedic device 02/10/2022   Mood disorder 08/18/2021   Osteoarthritis of left knee 06/04/2021   S/P total knee arthroplasty, left 05/06/2020   Eczema 04/17/2019   Mixed hyperlipidemia 08/02/2018   Numbness and tingling in both hands 08/02/2018   Essential hypertension 07/24/2018   H/O Graves' disease 07/24/2018   Attention deficit disorder 07/24/2018   Dyslipidemia 03/26/2016   Family history of premature CAD 03/26/2016   Tobacco abuse 08/13/2014   GERD without esophagitis 08/13/2014   Lumbar herniated disc 08/13/2014   Past Medical History:  Diagnosis Date   Anxiety    Arthritis    Arthrofibrosis of knee joint, left    post op TKA    History of Graves' disease 2008   s/p  RAI  same year , on medication for a year then stablized no meds since  (per pt followed by pcp)   Hyperlipidemia    Hypertension    followed by pcp   (in care everywhere pt had stress echo, 03-26-2016, negative for ishemia and echo normal)   Thyroid  disease    Wears glasses     Past Surgical History:  Procedure Laterality Date   ANTERIOR CRUCIATE LIGAMENT REPAIR Left 1990S   HERNIA REPAIR     JOINT REPLACEMENT  05/06/2020   KNEE ARTHROSCOPY W/ MEDIAL COLLATERAL LIGAMENT (MCL) REPAIR Left 1990s   KNEE ARTHROSCOPY W/ MENISCAL REPAIR Left 1990s   KNEE CLOSED REDUCTION Left 10/21/2020   Procedure: CLOSED MANIPULATION LEFT KNEE;  Surgeon: Josefina Chew, MD;  Location: [facility];  Service: Orthopedics;  Laterality: Left;   LIPOMA EXCISION  LEF SHOULDER AREA   RIGHT HEART CATH N/A 02/20/2024   Procedure: RIGHT HEART CATH;  Surgeon: Mady Bruckner, MD;  [facility];  Service: Cardiovascular;  Laterality: N/A;   TOTAL KNEE ARTHROPLASTY Left 05/06/2020   Procedure: TOTAL KNEE ARTHROPLASTY;  Surgeon: Josefina Chew, MD;  Location: [facility];  Service: Orthopedics;  Laterality: Left;   UMBILICAL HERNIA REPAIR  07-17-2015   @[email or web address]    AND RIGHT ABDOMINAL WALL EXCISION LIPOMA    Current Outpatient Medications  Medication Sig Dispense Refill Last Dose/Taking   amLODipine  (NORVASC ) 10 MG tablet Take 1 tablet (10 mg total) by mouth daily. 90 tablet 1    amphetamine -dextroamphetamine  (ADDERALL) 15 MG tablet Take 1 tablet by mouth 3 (three) times daily. 90 tablet 0    [START ON 03/13/2024] amphetamine -dextroamphetamine  (ADDERALL) 15 MG tablet Take 1 tablet by mouth 3 (three) times daily. 90 tablet 0    Azelastine  HCl 137 MCG/SPRAY SOLN Place 2 sprays into the nose daily. (Patient not taking: Reported on 02/17/2024) 30 mL 2    escitalopram  (LEXAPRO ) 20 MG tablet Take 1 tablet (20 mg total) by mouth daily. 90 tablet 1    hydrALAZINE  (APRESOLINE ) 25 MG tablet TAKE 1 TABLET TWICE DAILY, AM & NIGHT 180 tablet 1    lisinopril -hydrochlorothiazide  (ZESTORETIC ) 20-25 MG tablet Take 1 tablet by mouth daily. 90 tablet 1    meloxicam  (MOBIC ) 15 MG tablet TAKE 1 TABLET (15 MG TOTAL) BY MOUTH DAILY. 90 tablet 1    methimazole  (TAPAZOLE ) 5 MG tablet Take 1 tablet (5 mg total) by mouth as directed. 1 tablet Monday through Saturday and none on Sundays 78 tablet 3    methocarbamol  (ROBAXIN ) 500 MG tablet Take 500-1,000 mg by mouth every 6 (six) hours as needed.      Potassium Chloride  (K+ POTASSIUM PO) Take 99 mg by mouth daily. Over the counter, per pt takes for leg cramps      rosuvastatin  (CRESTOR ) 20 MG tablet Take 1 tablet (20 mg total) by mouth daily. 90 tablet 3    No current facility-administered medications for this visit.   No Known Allergies  Social History   Tobacco Use   Smoking status: Every Day    Current packs/day: 0.50    Average packs/day: 0.5 packs/day for 25.0 years (12.5 ttl pk-yrs)    Types: Cigarettes   Smokeless tobacco: Former    Quit date: 10/20/1988  Substance Use Topics   Alcohol use: Yes    Comment: occasional    Family History  Problem Relation Age of Onset   Hypertension Mother     Hypertension Father    Hypertension Brother    Cancer Paternal Grandfather    Hypertension Brother      Review of Systems  Musculoskeletal:  Positive for arthralgias.  All other systems reviewed and are negative.   Objective:  Physical Exam Constitutional:      General: He is not in acute distress.    Appearance: Normal appearance. He is normal weight.  HENT:     Head: Normocephalic and atraumatic.  Eyes:     Extraocular Movements: Extraocular movements intact.     Conjunctiva/sclera: Conjunctivae normal.     Pupils: Pupils are equal, round, and reactive to light.  Cardiovascular:     Rate and Rhythm: Normal rate and regular rhythm.     Pulses: Normal pulses.     Heart sounds: Normal heart sounds.  Pulmonary:     Effort: Pulmonary effort is normal. No respiratory distress.  Breath sounds: Normal breath sounds.  Abdominal:     General: Bowel sounds are normal. There is no distension.     Palpations: Abdomen is soft.     Tenderness: There is no abdominal tenderness.  Musculoskeletal:        General: Tenderness present.     Cervical back: Normal range of motion and neck supple.     Comments: TTP over medial joint line, none on lateral.  No calf tenderness, swelling, or erythema.  No overlying lesions of area of chief complaint.  Decreased strength and ROM due to elicited pain.  Dorsiflexion and plantarflexion intact.  Stable to varus and valgus stress.  BLE appear grossly neurovascularly intact.  Gait mildly antalgic.   Lymphadenopathy:     Cervical: No cervical adenopathy.  Skin:    General: Skin is warm and dry.     Capillary Refill: Capillary refill takes less than 2 seconds.     Findings: No erythema or rash.  Neurological:     General: No focal deficit present.     Mental Status: He is alert and oriented to person, place, and time.  Psychiatric:        Mood and Affect: Mood normal.        Behavior: Behavior normal.     Vital signs in last 24  hours: @VSRANGES @  Labs:   Estimated body mass index is 30.56 kg/m as calculated from the following:   Height as of 02/20/24: 6' 2 (1.88 m).   Weight as of 02/20/24: 108 kg.   Imaging Review Plain radiographs demonstrate severe degenerative joint disease of the right knee(s) appearing isolated to the medial compartment. The overall alignment issignificant varus. The bone quality appears to be fair for age and reported activity level.      Assessment/Plan:  End stage arthritis, right knee, medial compartment  The patient history, physical examination, clinical judgment of the provider and imaging studies are consistent with end stage degenerative joint disease of the right knee(s) and partial knee arthroplasty is deemed medically necessary. The treatment options including medical management, injection therapy arthroscopy and arthroplasty were discussed at length. The risks and benefits of total knee arthroplasty were presented and reviewed including the possibility to transition plan from partial to total knee dependent on surgical findings. The risks due to aseptic loosening, infection, stiffness, patella tracking problems, thromboembolic complications and other imponderables were discussed. The patient acknowledged the explanation, agreed to proceed with the plan and consent was signed. Patient is being admitted for inpatient treatment for surgery, pain control, PT, OT, prophylactic antibiotics, VTE prophylaxis, progressive ambulation and ADL's and discharge planning. The patient is planning to be discharged home to OPPT     Patient's anticipated LOS is less than 2 midnights, meeting these requirements: - Younger than 34 - Lives within 1 hour of care - Has a competent adult at home to recover with post-op recover - NO history of  - Chronic pain requiring opiods  - Diabetes  - Coronary Artery Disease  - Heart failure  - Heart attack  - Stroke  - DVT/VTE  - Cardiac  arrhythmia  - Respiratory Failure/COPD  - Renal failure  - Anemia  - Advanced Liver disease

## 2024-03-13 ENCOUNTER — Ambulatory Visit: Payer: Self-pay | Admitting: Emergency Medicine

## 2024-03-13 ENCOUNTER — Encounter (HOSPITAL_COMMUNITY): Payer: Self-pay

## 2024-03-13 NOTE — Progress Notes (Signed)
 Case: [id number] Date/Time: 03/21/24 1104   Procedure: ARTHROPLASTY, KNEE, UNICOMPARTMENTAL (Right: Knee)   Anesthesia type: Spinal   Pre-op diagnosis: OA RIGHT KNEE   [facility] 08 / [facility]   Surgeons: Edna Toribio LABOR, MD       DISCUSSION: Steven Gibson is a 53 yo male with PMH of current smoking, HTN, Graves' disease on Methimazole , prediabetes, anxiety, arthritis  Seen by PCP on 01/30/2024 for preop exam.  EKG was obtained which showed sinus rhythm with frequent PVCs and right atrial enlargement.  He was then referred to cardiology  Patient seen by cardiology on 02/09/2024 for preop clearance.  He denied any symptoms.  Echo was obtained which showed normal LVEF, mild LVH, moderately enlarged RV, normal valves, possible small PFO. Per Dr. Ren Tonleu:  - TTE results with normal biventricular function --> no further evaluation needed prior to his surgery.  - His RV is enlarged and as part of the workup, I want to proceed with RHC with shunt run. I discuss the risk and benefits of the procedure with the patient and he agreed to move forward.  Right heart cath done on 02/20/2024 which showed no intracardiac shunting, Mildly-moderately elevated right heart filling pressures, borderline pulmonary HTN.  VS: BP (!) 155/98 Comment: right arm sitting  Pulse 94   Temp 36.7 C (Oral)   Resp 20   Ht 6' 2 (1.88 m)   Wt 107.5 kg   SpO2 100%   BMI 30.43 kg/m   PROVIDERS: Georgina Speaks, FNP   LABS: Labs reviewed: Acceptable for surgery. (all labs ordered are listed, but only abnormal results are displayed)  Labs Reviewed  CBC WITH DIFFERENTIAL/PLATELET - Abnormal; Notable for the following components:      Result Value   Eosinophils Absolute 0.6 (*)    All other components within normal limits  COMPREHENSIVE METABOLIC PANEL WITH GFR - Abnormal; Notable for the following components:   Glucose, Bld 110 (*)    Alkaline Phosphatase 163 (*)    All other components within  normal limits  SURGICAL PCR SCREEN  TYPE AND SCREEN     EKG 01/30/24:  Sinus Rhythm -frequent ectopic ventricular beats  # VECs = 2 -Right atrial enlargement.   RHC 02/20/24:  Conclusions: Upper normal left heart filling pressure (PCWP 16 mmHg). Mildly-moderately elevated right heart filling pressures (mean RA and RVEDP 12 mmHg). Borderline pulmonary hypertension (PAP 29/16, mean 20 mmHg). Normal Fick cardiac output/index (CO 7.4 L/min, CI 3.2 L/min/m). No evidence of hemodynamically significant intracardiac shunting (Qp/Qs = 1.0).   Recommendations: Further evaluation of RV enlargement and elevated RV/PA pressures per Dr. Ren Ny.  Echo 02/10/24:  IMPRESSIONS    1. Left ventricular ejection fraction, by estimation, is 55 to 60%. Left ventricular ejection fraction by 3D volume is 55 %. The left ventricle has normal function. The left ventricle has no regional wall motion abnormalities. There is mild concentric left ventricular hypertrophy. Left ventricular diastolic parameters were normal.  2. Right ventricular systolic function is normal. The right ventricular size is moderately enlarged.  3. The mitral valve is grossly normal. No evidence of mitral valve regurgitation. No evidence of mitral stenosis.  4. The aortic valve is tricuspid. Aortic valve regurgitation is not visualized. No aortic stenosis is present.  5. The inferior vena cava is normal in size with greater than 50% respiratory variability, suggesting right atrial pressure of 3 mmHg.  6. Cannot exclude a small PFO.  Comparison(s): No prior Echocardiogram.  Past Medical History:  Diagnosis Date   ADHD (attention deficit hyperactivity disorder)    Anxiety    Arthritis    Arthrofibrosis of knee joint, left    post op TKA    History of Graves' disease 2008   s/p  RAI  same year , on medication for a year then stablized no meds since  (per pt followed by pcp)   Hyperlipidemia    Hypertension     followed by pcp   (in care everywhere pt had stress echo, 03-26-2016, negative for ishemia and echo normal)   Hyperthyroidism    grave's disease   Pre-diabetes    Right ventricular dysfunction    per RHC 02-20-24   Thyroid  disease    Wears glasses     Past Surgical History:  Procedure Laterality Date   ANTERIOR CRUCIATE LIGAMENT REPAIR Left 1990S   HERNIA REPAIR     JOINT REPLACEMENT  05/06/2020   KNEE ARTHROSCOPY W/ MEDIAL COLLATERAL LIGAMENT (MCL) REPAIR Left 1990s   KNEE ARTHROSCOPY W/ MENISCAL REPAIR Left 1990s   KNEE CLOSED REDUCTION Left 10/21/2020   Procedure: CLOSED MANIPULATION LEFT KNEE;  Surgeon: Josefina Chew, MD;  Location: [facility] [place];  Service: Orthopedics;  Laterality: Left;   LIPOMA EXCISION     LEF SHOULDER AREA   RIGHT HEART CATH N/A 02/20/2024   Procedure: RIGHT HEART CATH;  Surgeon: Mady Bruckner, MD;  [facility]: [facility];  Service: Cardiovascular;  Laterality: N/A;   TOTAL KNEE ARTHROPLASTY Left 05/06/2020   Procedure: TOTAL KNEE ARTHROPLASTY;  Surgeon: Josefina Chew, MD;  Location: [facility];  Service: Orthopedics;  Laterality: Left;   UMBILICAL HERNIA REPAIR  07-17-2015  [email or web address]    AND RIGHT ABDOMINAL WALL EXCISION LIPOMA    MEDICATIONS:  amLODipine  (NORVASC ) 10 MG tablet   amphetamine -dextroamphetamine  (ADDERALL) 15 MG tablet   amphetamine -dextroamphetamine  (ADDERALL) 15 MG tablet   Azelastine  HCl 137 MCG/SPRAY SOLN   escitalopram  (LEXAPRO ) 20 MG tablet   hydrALAZINE  (APRESOLINE ) 25 MG tablet   lisinopril -hydrochlorothiazide  (ZESTORETIC ) 20-25 MG tablet   meloxicam  (MOBIC ) 15 MG tablet   methimazole  (TAPAZOLE ) 5 MG tablet   methocarbamol  (ROBAXIN ) 500 MG tablet   Potassium Chloride  (K+ POTASSIUM PO)   rosuvastatin  (CRESTOR ) 20 MG tablet   No current facility-administered medications for this encounter.   Burnard CHRISTELLA Odis DEVONNA [facility] Short Stay/Anesthesiology [facility] Phone [phone] 03/13/2024 3:06  PM

## 2024-03-13 NOTE — Anesthesia Preprocedure Evaluation (Addendum)
 Anesthesia Evaluation  Patient identified by MRN, date of birth, ID band Patient awake    Reviewed: Allergy & Precautions, NPO status , Patient's Chart, lab work & pertinent test results  Airway Mallampati: III  TM Distance: >3 FB Neck ROM: Full    Dental  (+) Teeth Intact, Dental Advisory Given   Pulmonary Current Smoker and Patient abstained from smoking. 25 pack year history  Per pt negative sleep study about 15 years ago, but does snore 7-8cigg/d, none this AM No inhalers at home   Pulmonary exam normal breath sounds clear to auscultation       Cardiovascular hypertension (145/93 preop), Pt. on medications pulmonary hypertension (mild pHTN RHC)Normal cardiovascular exam Rhythm:Regular Rate:Normal  Echo was obtained which showed normal LVEF, mild LVH, moderately enlarged RV, normal valves, possible small PFO. Per Dr. Ren Tonleu:   - TTE results with normal biventricular function --> no further evaluation needed prior to his surgery.  - His RV is enlarged and as part of the workup, I want to proceed with RHC with shunt run. I discuss the risk and benefits of the procedure with the patient and he agreed to move forward.   Right heart cath done on 02/20/2024 which showed no intracardiac shunting, Mildly-moderately elevated right heart filling pressures, borderline pulmonary HTN.    Neuro/Psych  PSYCHIATRIC DISORDERS Anxiety     negative neurological ROS     GI/Hepatic Neg liver ROS,GERD  Controlled,,  Endo/Other   Hyperthyroidism (graves dx- methimazole ) Obesity BMI 30  Renal/GU negative Renal ROS  negative genitourinary   Musculoskeletal  (+) Arthritis , Osteoarthritis,    Abdominal  (+) + obese  Peds  Hematology negative hematology ROS (+)   Anesthesia Other Findings   Reproductive/Obstetrics negative OB ROS                              Anesthesia Physical Anesthesia Plan  ASA:  3  Anesthesia Plan: Spinal, Regional and MAC   Post-op Pain Management: Regional block* and Tylenol  PO (pre-op)*   Induction:   PONV Risk Score and Plan: 2 and Propofol  infusion and TIVA  Airway Management Planned: Natural Airway and Nasal Cannula  Additional Equipment: None  Intra-op Plan:   Post-operative Plan:   Informed Consent: I have reviewed the patients History and Physical, chart, labs and discussed the procedure including the risks, benefits and alternatives for the proposed anesthesia with the patient or authorized representative who has indicated his/her understanding and acceptance.       Plan Discussed with: CRNA  Anesthesia Plan Comments: (L TKR 2021: Patient poorly cooperative during spinal procedure despite appropriate sedation, decision made to proceed with general anesthesia for patient safety.)         Anesthesia Quick Evaluation

## 2024-03-21 ENCOUNTER — Ambulatory Visit (HOSPITAL_COMMUNITY)

## 2024-03-21 ENCOUNTER — Encounter (HOSPITAL_COMMUNITY): Payer: Self-pay | Admitting: Orthopedic Surgery

## 2024-03-21 ENCOUNTER — Ambulatory Visit (HOSPITAL_COMMUNITY): Payer: Self-pay | Admitting: Anesthesiology

## 2024-03-21 ENCOUNTER — Encounter (HOSPITAL_COMMUNITY)
Admission: RE | Disposition: A | Discharge status: Home / Self Care | Payer: Self-pay | Source: Ambulatory Visit | Attending: Orthopedic Surgery

## 2024-03-21 ENCOUNTER — Other Ambulatory Visit: Payer: Self-pay

## 2024-03-21 ENCOUNTER — Ambulatory Visit (HOSPITAL_COMMUNITY)
Admission: RE | Admit: 2024-03-21 | Discharge: 2024-03-21 | Disposition: A | Discharge status: Home / Self Care | Source: Ambulatory Visit | Attending: Orthopedic Surgery | Admitting: Orthopedic Surgery

## 2024-03-21 ENCOUNTER — Ambulatory Visit (HOSPITAL_COMMUNITY): Payer: Self-pay | Admitting: Physician Assistant

## 2024-03-21 DIAGNOSIS — E0590 Thyrotoxicosis, unspecified without thyrotoxic crisis or storm: Secondary | ICD-10-CM | POA: Diagnosis not present

## 2024-03-21 DIAGNOSIS — F17210 Nicotine dependence, cigarettes, uncomplicated: Secondary | ICD-10-CM | POA: Diagnosis not present

## 2024-03-21 DIAGNOSIS — I10 Essential (primary) hypertension: Secondary | ICD-10-CM | POA: Diagnosis not present

## 2024-03-21 DIAGNOSIS — Z79899 Other long term (current) drug therapy: Secondary | ICD-10-CM | POA: Diagnosis not present

## 2024-03-21 DIAGNOSIS — K219 Gastro-esophageal reflux disease without esophagitis: Secondary | ICD-10-CM | POA: Diagnosis not present

## 2024-03-21 DIAGNOSIS — G8918 Other acute postprocedural pain: Secondary | ICD-10-CM | POA: Diagnosis not present

## 2024-03-21 DIAGNOSIS — Z471 Aftercare following joint replacement surgery: Secondary | ICD-10-CM | POA: Diagnosis not present

## 2024-03-21 DIAGNOSIS — R7303 Prediabetes: Secondary | ICD-10-CM | POA: Insufficient documentation

## 2024-03-21 DIAGNOSIS — Z791 Long term (current) use of non-steroidal anti-inflammatories (NSAID): Secondary | ICD-10-CM | POA: Insufficient documentation

## 2024-03-21 DIAGNOSIS — M1711 Unilateral primary osteoarthritis, right knee: Secondary | ICD-10-CM | POA: Insufficient documentation

## 2024-03-21 DIAGNOSIS — Z96651 Presence of right artificial knee joint: Secondary | ICD-10-CM | POA: Diagnosis not present

## 2024-03-21 DIAGNOSIS — E66811 Obesity, class 1: Secondary | ICD-10-CM | POA: Insufficient documentation

## 2024-03-21 DIAGNOSIS — Z6830 Body mass index (BMI) 30.0-30.9, adult: Secondary | ICD-10-CM | POA: Insufficient documentation

## 2024-03-21 DIAGNOSIS — G8929 Other chronic pain: Secondary | ICD-10-CM

## 2024-03-21 DIAGNOSIS — E782 Mixed hyperlipidemia: Secondary | ICD-10-CM | POA: Diagnosis not present

## 2024-03-21 DIAGNOSIS — F419 Anxiety disorder, unspecified: Secondary | ICD-10-CM | POA: Diagnosis not present

## 2024-03-21 DIAGNOSIS — I272 Pulmonary hypertension, unspecified: Secondary | ICD-10-CM | POA: Diagnosis not present

## 2024-03-21 DIAGNOSIS — M25461 Effusion, right knee: Secondary | ICD-10-CM | POA: Diagnosis not present

## 2024-03-21 HISTORY — PX: PARTIAL KNEE ARTHROPLASTY: SHX2174

## 2024-03-21 LAB — TYPE AND SCREEN
ABO/RH(D): O POS
Antibody Screen: NEGATIVE

## 2024-03-21 LAB — ABO/RH: ABO/RH(D): O POS

## 2024-03-21 SURGERY — ARTHROPLASTY, KNEE, UNICOMPARTMENTAL
Anesthesia: Monitor Anesthesia Care | Site: Knee | Laterality: Right

## 2024-03-21 MED ORDER — HYDROMORPHONE HCL 1 MG/ML IJ SOLN
0.2500 mg | INTRAMUSCULAR | Status: DC | PRN
  Administered 2024-03-21 (×4): 0.5 mg via INTRAVENOUS

## 2024-03-21 MED ORDER — PHENYLEPHRINE HCL-NACL 20-0.9 MG/250ML-% IV SOLN
INTRAVENOUS | Status: DC | PRN
  Administered 2024-03-21: 25 ug/min via INTRAVENOUS

## 2024-03-21 MED ORDER — POLYETHYLENE GLYCOL 3350 17 G PO PACK: 17.0000 g | PACK | Freq: Every day | ORAL | 0 refills | Status: DC

## 2024-03-21 MED ORDER — BUPIVACAINE-EPINEPHRINE (PF) 0.25% -1:200000 IJ SOLN
INTRAMUSCULAR | Status: DC | PRN
  Administered 2024-03-21: 30 mL via PERINEURAL

## 2024-03-21 MED ORDER — OXYCODONE HCL 5 MG PO TABS
ORAL_TABLET | ORAL | Status: AC
  Filled 2024-03-21: qty 1

## 2024-03-21 MED ORDER — ORAL CARE MOUTH RINSE: 15.0000 mL | Freq: Once | OROMUCOSAL | Status: AC

## 2024-03-21 MED ORDER — CEFAZOLIN SODIUM-DEXTROSE 2-4 GM/100ML-% IV SOLN
2.0000 g | INTRAVENOUS | Status: AC
  Administered 2024-03-21: 2 g via INTRAVENOUS
  Filled 2024-03-21: qty 100

## 2024-03-21 MED ORDER — ONDANSETRON HCL 4 MG PO TABS: 4.0000 mg | ORAL_TABLET | Freq: Three times a day (TID) | ORAL | 0 refills | Status: AC | PRN

## 2024-03-21 MED ORDER — ACETAMINOPHEN 500 MG PO TABS: 1000.0000 mg | ORAL_TABLET | Freq: Three times a day (TID) | ORAL | Status: AC | PRN

## 2024-03-21 MED ORDER — FENTANYL CITRATE (PF) 100 MCG/2ML IJ SOLN
INTRAMUSCULAR | Status: AC
  Filled 2024-03-21: qty 2

## 2024-03-21 MED ORDER — METHOCARBAMOL 500 MG PO TABS: 500.0000 mg | ORAL_TABLET | Freq: Three times a day (TID) | ORAL | 0 refills | Status: AC | PRN

## 2024-03-21 MED ORDER — CEFAZOLIN SODIUM-DEXTROSE 2-4 GM/100ML-% IV SOLN: 2.0000 g | Freq: Four times a day (QID) | INTRAVENOUS | Status: DC

## 2024-03-21 MED ORDER — BUPIVACAINE LIPOSOME 1.3 % IJ SUSP: 20.0000 mL | Freq: Once | INTRAMUSCULAR | Status: DC

## 2024-03-21 MED ORDER — FENTANYL CITRATE (PF) 100 MCG/2ML IJ SOLN
INTRAMUSCULAR | Status: DC | PRN
  Administered 2024-03-21 (×2): 50 ug via INTRAVENOUS

## 2024-03-21 MED ORDER — ONDANSETRON HCL 4 MG/2ML IJ SOLN: 4.0000 mg | Freq: Once | INTRAMUSCULAR | Status: DC | PRN

## 2024-03-21 MED ORDER — ASPIRIN 81 MG PO TBEC: 81.0000 mg | DELAYED_RELEASE_TABLET | Freq: Two times a day (BID) | ORAL | Status: AC

## 2024-03-21 MED ORDER — OMEPRAZOLE 40 MG PO CPDR: 40.0000 mg | DELAYED_RELEASE_CAPSULE | Freq: Every day | ORAL | 0 refills | Status: DC

## 2024-03-21 MED ORDER — GLYCOPYRROLATE 0.2 MG/ML IJ SOLN
INTRAMUSCULAR | Status: DC | PRN
  Administered 2024-03-21: .1 mg via INTRAVENOUS

## 2024-03-21 MED ORDER — BUPIVACAINE LIPOSOME 1.3 % IJ SUSP
INTRAMUSCULAR | Status: DC | PRN
  Administered 2024-03-21: 20 mL

## 2024-03-21 MED ORDER — BUPIVACAINE LIPOSOME 1.3 % IJ SUSP
INTRAMUSCULAR | Status: AC
  Filled 2024-03-21: qty 20

## 2024-03-21 MED ORDER — MIDAZOLAM HCL (PF) 2 MG/2ML IJ SOLN
1.0000 mg | Freq: Once | INTRAMUSCULAR | Status: AC
Start: 2024-03-21 — End: 2024-03-21
  Administered 2024-03-21: 2 mg via INTRAVENOUS
  Filled 2024-03-21: qty 2

## 2024-03-21 MED ORDER — ONDANSETRON HCL 4 MG/2ML IJ SOLN: 4.0000 mg | Freq: Four times a day (QID) | INTRAMUSCULAR | Status: DC | PRN

## 2024-03-21 MED ORDER — OXYCODONE HCL 5 MG PO TABS: 5.0000 mg | ORAL_TABLET | ORAL | 0 refills | Status: AC | PRN

## 2024-03-21 MED ORDER — DEXAMETHASONE SOD PHOSPHATE PF 10 MG/ML IJ SOLN
8.0000 mg | Freq: Once | INTRAMUSCULAR | Status: AC
  Administered 2024-03-21: 8 mg via INTRAVENOUS

## 2024-03-21 MED ORDER — LACTATED RINGERS IV BOLUS: 250.0000 mL | Freq: Once | INTRAVENOUS | Status: DC

## 2024-03-21 MED ORDER — FENTANYL CITRATE (PF) 50 MCG/ML IJ SOSY
50.0000 ug | PREFILLED_SYRINGE | Freq: Once | INTRAMUSCULAR | Status: AC
  Administered 2024-03-21: 100 ug via INTRAVENOUS
  Filled 2024-03-21: qty 2

## 2024-03-21 MED ORDER — HYDROMORPHONE HCL 1 MG/ML IJ SOLN: 0.5000 mg | INTRAMUSCULAR | Status: DC | PRN

## 2024-03-21 MED ORDER — DEXAMETHASONE SOD PHOSPHATE PF 10 MG/ML IJ SOLN
INTRAMUSCULAR | Status: DC | PRN
  Administered 2024-03-21: 10 mg via PERINEURAL

## 2024-03-21 MED ORDER — LACTATED RINGERS IV SOLN: INTRAVENOUS | Status: DC | PRN

## 2024-03-21 MED ORDER — SODIUM CHLORIDE 0.9 % IR SOLN
Status: DC | PRN
  Administered 2024-03-21: 3000 mL

## 2024-03-21 MED ORDER — MEPIVACAINE HCL (PF) 2 % IJ SOLN
INTRAMUSCULAR | Status: DC | PRN
  Administered 2024-03-21: 3 mL via INTRATHECAL

## 2024-03-21 MED ORDER — LIDOCAINE HCL (PF) 2 % IJ SOLN
INTRAMUSCULAR | Status: AC
  Filled 2024-03-21: qty 10

## 2024-03-21 MED ORDER — TRANEXAMIC ACID-NACL 1000-0.7 MG/100ML-% IV SOLN
1000.0000 mg | INTRAVENOUS | Status: AC
  Administered 2024-03-21: 1000 mg via INTRAVENOUS
  Filled 2024-03-21: qty 100

## 2024-03-21 MED ORDER — ONDANSETRON HCL 4 MG/2ML IJ SOLN
INTRAMUSCULAR | Status: AC
  Filled 2024-03-21: qty 4

## 2024-03-21 MED ORDER — OXYCODONE HCL 5 MG PO TABS: 5.0000 mg | ORAL_TABLET | Freq: Once | ORAL | Status: DC | PRN

## 2024-03-21 MED ORDER — METHOCARBAMOL 500 MG PO TABS
500.0000 mg | ORAL_TABLET | Freq: Four times a day (QID) | ORAL | Status: DC | PRN
Start: 2024-03-21 — End: 2024-03-21

## 2024-03-21 MED ORDER — METHOCARBAMOL 1000 MG/10ML IJ SOLN: 500.0000 mg | Freq: Four times a day (QID) | INTRAMUSCULAR | Status: DC | PRN

## 2024-03-21 MED ORDER — ACETAMINOPHEN 500 MG PO TABS: 1000.0000 mg | ORAL_TABLET | Freq: Once | ORAL | Status: DC

## 2024-03-21 MED ORDER — EPHEDRINE 5 MG/ML INJ
INTRAVENOUS | Status: AC
  Filled 2024-03-21: qty 10

## 2024-03-21 MED ORDER — PHENYLEPHRINE 80 MCG/ML (10ML) SYRINGE FOR IV PUSH (FOR BLOOD PRESSURE SUPPORT)
PREFILLED_SYRINGE | INTRAVENOUS | Status: AC
  Filled 2024-03-21: qty 10

## 2024-03-21 MED ORDER — EPHEDRINE SULFATE-NACL 50-0.9 MG/10ML-% IV SOSY
PREFILLED_SYRINGE | INTRAVENOUS | Status: DC | PRN
  Administered 2024-03-21: 10 mg via INTRAVENOUS

## 2024-03-21 MED ORDER — ROPIVACAINE HCL 5 MG/ML IJ SOLN
INTRAMUSCULAR | Status: DC | PRN
  Administered 2024-03-21: 30 mL via PERINEURAL

## 2024-03-21 MED ORDER — OXYCODONE HCL 5 MG PO TABS
5.0000 mg | ORAL_TABLET | ORAL | Status: DC | PRN
  Administered 2024-03-21: 5 mg via ORAL

## 2024-03-21 MED ORDER — PROPOFOL 500 MG/50ML IV EMUL
INTRAVENOUS | Status: DC | PRN
  Administered 2024-03-21: 150 ug/kg/min via INTRAVENOUS

## 2024-03-21 MED ORDER — AMISULPRIDE (ANTIEMETIC) 5 MG/2ML IV SOLN
10.0000 mg | Freq: Once | INTRAVENOUS | Status: DC | PRN
Start: 2024-03-21 — End: 2024-03-21

## 2024-03-21 MED ORDER — MELOXICAM 15 MG PO TABS: 15.0000 mg | ORAL_TABLET | Freq: Every day | ORAL | 0 refills | Status: AC

## 2024-03-21 MED ORDER — OXYCODONE HCL 5 MG/5ML PO SOLN: 5.0000 mg | Freq: Once | ORAL | Status: DC | PRN

## 2024-03-21 MED ORDER — SODIUM CHLORIDE 0.9 % IV SOLN: INTRAVENOUS | Status: DC

## 2024-03-21 MED ORDER — LACTATED RINGERS IV BOLUS
500.0000 mL | Freq: Once | INTRAVENOUS | Status: AC
  Administered 2024-03-21: 500 mL via INTRAVENOUS

## 2024-03-21 MED ORDER — DEXMEDETOMIDINE HCL IN NACL 80 MCG/20ML IV SOLN
INTRAVENOUS | Status: DC | PRN
  Administered 2024-03-21 (×3): 4 ug via INTRAVENOUS
  Administered 2024-03-21: 8 ug via INTRAVENOUS
  Administered 2024-03-21: 4 ug via INTRAVENOUS

## 2024-03-21 MED ORDER — HYDROMORPHONE HCL 1 MG/ML IJ SOLN
INTRAMUSCULAR | Status: AC
  Filled 2024-03-21: qty 1

## 2024-03-21 MED ORDER — CHLORHEXIDINE GLUCONATE 0.12 % MT SOLN
15.0000 mL | Freq: Once | OROMUCOSAL | Status: AC
  Administered 2024-03-21: 15 mL via OROMUCOSAL

## 2024-03-21 MED ORDER — POVIDONE-IODINE 10 % EX SWAB: 2.0000 | Freq: Once | CUTANEOUS | Status: DC

## 2024-03-21 MED ORDER — BUPIVACAINE-EPINEPHRINE (PF) 0.25% -1:200000 IJ SOLN
INTRAMUSCULAR | Status: AC
  Filled 2024-03-21: qty 30

## 2024-03-21 MED ORDER — KETOROLAC TROMETHAMINE 15 MG/ML IJ SOLN: 15.0000 mg | Freq: Four times a day (QID) | INTRAMUSCULAR | Status: DC

## 2024-03-21 MED ORDER — LACTATED RINGERS IV SOLN: INTRAVENOUS | Status: DC

## 2024-03-21 MED ORDER — ACETAMINOPHEN 500 MG PO TABS
1000.0000 mg | ORAL_TABLET | Freq: Once | ORAL | Status: AC
  Administered 2024-03-21: 1000 mg via ORAL
  Filled 2024-03-21: qty 2

## 2024-03-21 MED ORDER — ONDANSETRON HCL 4 MG/2ML IJ SOLN
INTRAMUSCULAR | Status: DC | PRN
  Administered 2024-03-21: 4 mg via INTRAVENOUS

## 2024-03-21 MED ORDER — 0.9 % SODIUM CHLORIDE (POUR BTL) OPTIME
TOPICAL | Status: DC | PRN
  Administered 2024-03-21: 1000 mL

## 2024-03-21 MED ORDER — ACETAMINOPHEN 500 MG PO TABS: 1000.0000 mg | ORAL_TABLET | Freq: Four times a day (QID) | ORAL | Status: DC

## 2024-03-21 MED ORDER — ONDANSETRON HCL 4 MG PO TABS: 4.0000 mg | ORAL_TABLET | Freq: Four times a day (QID) | ORAL | Status: DC | PRN

## 2024-03-21 SURGICAL SUPPLY — 51 items
BAG COUNTER SPONGE SURGICOUNT (BAG) ×1 IMPLANT
BLADE SAW RECIPROCATING 77.5 (BLADE) ×1 IMPLANT
BLADE SAW SGTL 13.0X1.19X90.0M (BLADE) ×1 IMPLANT
BNDG COHESIVE 3X5 TAN ST LF (GAUZE/BANDAGES/DRESSINGS) ×1 IMPLANT
BNDG ELASTIC 6X10 VLCR STRL LF (GAUZE/BANDAGES/DRESSINGS) ×1 IMPLANT
BNDG ELASTIC 6X15 VLCR STRL LF (GAUZE/BANDAGES/DRESSINGS) IMPLANT
BOWL SMART MIX CTS (DISPOSABLE) ×1 IMPLANT
CEMENT BONE R 1X40 (Cement) IMPLANT
CHLORAPREP W/TINT 26 (MISCELLANEOUS) ×1 IMPLANT
COVER SURGICAL LIGHT HANDLE (MISCELLANEOUS) ×1 IMPLANT
CUFF TRNQT CYL 34X4.125X (TOURNIQUET CUFF) ×1 IMPLANT
DERMABOND ADVANCED .7 DNX12 (GAUZE/BANDAGES/DRESSINGS) ×1 IMPLANT
DRAPE INCISE IOBAN 85X60 (DRAPES) ×1 IMPLANT
DRAPE SHEET LG 3/4 BI-LAMINATE (DRAPES) ×1 IMPLANT
DRAPE U-SHAPE 47X51 STRL (DRAPES) ×1 IMPLANT
DRESSING AQUACEL AG SP 3.5X10 (GAUZE/BANDAGES/DRESSINGS) IMPLANT
DRSG AQUACEL AG ADV 3.5X10 (GAUZE/BANDAGES/DRESSINGS) IMPLANT
ELECT REM PT RETURN 15FT ADLT (MISCELLANEOUS) ×1 IMPLANT
GAUZE SPONGE 4X4 12PLY STRL (GAUZE/BANDAGES/DRESSINGS) ×1 IMPLANT
GLOVE BIO SURGEON STRL SZ 6.5 (GLOVE) ×1 IMPLANT
GLOVE BIOGEL PI IND STRL 6.5 (GLOVE) ×2 IMPLANT
GLOVE BIOGEL PI IND STRL 8 (GLOVE) ×1 IMPLANT
GLOVE SURG ORTHO 8.0 STRL STRW (GLOVE) ×2 IMPLANT
GOWN STRL REUS W/ TWL XL LVL3 (GOWN DISPOSABLE) ×2 IMPLANT
HOOD PEEL AWAY T7 (MISCELLANEOUS) ×3 IMPLANT
INSERT TIB BEAR CMT PS RM F (Insert) IMPLANT
INSERT TIB BEAR KNEE F 8 RT (Insert) IMPLANT
INSERTER TIP PARTIAL KNEE (MISCELLANEOUS) IMPLANT
KIT TURNOVER KIT A (KITS) ×1 IMPLANT
KNEE PSN PK CMT FEM RM SZ 5 (Orthopedic Implant) IMPLANT
MANIFOLD NEPTUNE II (INSTRUMENTS) ×1 IMPLANT
MARKER SKIN DUAL TIP RULER LAB (MISCELLANEOUS) ×1 IMPLANT
NS IRRIG 1000ML POUR BTL (IV SOLUTION) ×1 IMPLANT
PACK TOTAL KNEE CUSTOM (KITS) ×1 IMPLANT
PENCIL SMOKE EVACUATOR (MISCELLANEOUS) ×1 IMPLANT
PIN DRILL HDLS TROCAR 75 4PK (PIN) IMPLANT
SCREW HEADED 33MM KNEE (MISCELLANEOUS) IMPLANT
SCREW HEADED 48MM KNEE (MISCELLANEOUS) IMPLANT
SET HNDPC FAN SPRY TIP SCT (DISPOSABLE) ×1 IMPLANT
SOLUTION IRRIG SURGIPHOR (IV SOLUTION) IMPLANT
STRIP CLOSURE SKIN 1/2X4 (GAUZE/BANDAGES/DRESSINGS) ×1 IMPLANT
SUT MNCRL AB 3-0 PS2 18 (SUTURE) ×1 IMPLANT
SUT MNCRL AB 3-0 PS2 27 (SUTURE) ×1 IMPLANT
SUT STRATAFIX 14 PDO 48 VLT (SUTURE) ×1 IMPLANT
SUT VIC AB 0 CT1 36 (SUTURE) ×1 IMPLANT
SUT VIC AB 2-0 CT2 27 (SUTURE) ×1 IMPLANT
SUTURE STRATFX 0 PDS 27 VIOLET (SUTURE) ×1 IMPLANT
TRAY FOLEY MTR SLVR 16FR STAT (SET/KITS/TRAYS/PACK) IMPLANT
TUBE SUCTION HIGH CAP CLEAR NV (SUCTIONS) ×1 IMPLANT
UNDERPAD 30X36 HEAVY ABSORB (UNDERPADS AND DIAPERS) ×1 IMPLANT
WRAP KNEE MAXI GEL POST OP (GAUZE/BANDAGES/DRESSINGS) ×1 IMPLANT

## 2024-03-21 NOTE — Anesthesia Procedure Notes (Signed)
 Spinal  Patient location during procedure: OR Start time: 03/21/2024 11:33 AM End time: 03/21/2024 11:43 AM Reason for block: surgical anesthesia Staffing Performed: anesthesiologist  Anesthesiologist: Merla Almarie HERO, DO Performed by: Merla Almarie HERO, DO Authorized by: Merla Almarie HERO, DO   Preanesthetic Checklist Completed: patient identified, IV checked, risks and benefits discussed, surgical consent, monitors and equipment checked, pre-op evaluation and timeout performed Spinal Block Patient position: sitting Prep: DuraPrep and site prepped and draped Patient monitoring: cardiac monitor, continuous pulse ox and blood pressure Approach: midline Location: L3-4 Injection technique: single-shot Needle Needle type: Pencan and Tuohy  Needle gauge: 25 G Needle length: 9 cm Needle insertion depth: 9 cm Assessment Sensory level: T6 Events: CSF return Additional Notes Functioning IV was confirmed and monitors were applied. Sterile prep and drape, including hand hygiene and sterile gloves were used. The patient was positioned and the spine was prepped. The skin was anesthetized with lidocaine .  Free flow of clear CSF was obtained prior to injecting local anesthetic into the CSF.  The spinal needle aspirated freely following injection.  The needle was carefully withdrawn.  The patient tolerated the procedure well.   Difficult spinal at two levels, successful with CSE (25G pencan through 17G tuohy), LOR @9cm . Difficult to position patient effectively, fat pad over lumbar region.

## 2024-03-21 NOTE — Progress Notes (Signed)
 Orthopedic Tech Progress Note Patient Details:  Steven Gibson [birth year] [id number]  Ortho Devices Type of Ortho Device: Bone foam zero knee Ortho Device/Splint Location: right Ortho Device/Splint Interventions: Ordered, Application, Adjustment   Post Interventions Patient Tolerated: Well Instructions Provided: Adjustment of device, Care of device  Steven Gibson, Steven Gibson 03/21/2024, 2:22 PM

## 2024-03-21 NOTE — Op Note (Signed)
 03/21/2024  1:23 PM  PATIENT:  Steven Gibson    PRE-OPERATIVE DIAGNOSIS: Right knee end-stage medial compartment osteoarthritis  POST-OPERATIVE DIAGNOSIS:  Same  PROCEDURE: Right medial unicompartmental Knee Arthroplasty  SURGEON:  TORIBIO DELENA HIGASHI, MD  PHYSICIAN ASSISTANT: Bernarda Mclean, PA-C, present and scrubbed throughout the case, critical for completion in a timely fashion, and for retraction, instrumentation, and closure.  ANESTHESIA:   spinal  ESTIMATED BLOOD LOSS: 50cc  PREOPERATIVE INDICATIONS:  Steven Gibson is a  53 y.o. male with a diagnosis of OA RIGHT KNEE who failed conservative measures and elected for surgical management.    The risks benefits and alternatives were discussed with the patient preoperatively including but not limited to the risks of infection, bleeding, nerve injury, cardiopulmonary complications, blood clots, the need for revision surgery, among others, and the patient was willing to proceed.  OPERATIVE IMPLANTS: [employer] PPK fixed bearing medial compartment arthroplasty femur size 5, tibia size F, bearing size 8mm.  OPERATIVE FINDINGS: Endstage grade 4 anteromedial compartment osteoarthritis. No significant changes in the lateral or patellofemoral joint.  The ACL was intact.  OPERATIVE PROCEDURE:   Once adequate anesthesia, preoperative antibiotics, 2 gm of ancef ,1 gm of Tranexamic Acid , and 8 mg of Decadron  administered, the patient was positioned supine with a right thigh tourniquet placed.  The righ lower extremity was prepped and draped in sterile fashion.  A time-  out was performed identifying the patient, planned procedure, and the appropriate extremity.   The leg was elevated and exsanguinated and the tourniquet was inflated. Anterior midline incision was performed. Medial Parapatellar incision was carried out, and the osteophytes were excised, along with the medial plateau and femoral condyle. Resected anterior  horn of medial meniscus and a small portion of the fat pad. Medial release was performed.  Remainder of the knee was evaluated.  ACL was intact lateral compartment with no significant wear.  Patellofemoral compartment with mild wear.  The extra medullary tibial cutting jig was applied, and resection was performed measuring 4 mm from the anterior medial defect.  Cut was made perpendicular to the axis of the tibia, matching the patient's native slope, and sagittal cut was made with appropriate rotation and just medial to the ACL insertion.    The proximal tibial bony cut was removed in one piece, and I turned my attention to the femur.  Rasp was used to clear debris from the corner of the cut.  We next turned our attention to the femur.  Distal femoral cutting block was put in place and pinned and the distal femur was cut.  The cutting guide was removed and the cut was completed with the knee in flexion.  We then sized the femur to be a 5 and then pinned the femur cutting guide into place taking care to appropriately lateralized and not overhang the component, medially or anteriorly.  The femur was drilled and the posterior and chamfer cuts were made.  With the knee in flexion medial meniscectomy was performed.  We next size of the tibia and found it to be a size F.  The tibial trial was then impacted into place in the right position pinned and the lugs were drilled.  The trial femoral component was then impacted, and a size 8mm liner trial was inserted.  With the trial liner in place we used the Amber sticks and had approximately 2 mm of laxity in extension and 2 to 3 mm of laxity in flexion.  The  femur also tracked appropriately on the center of the tibial component through flexion.   I then cemented the components into place, cementing the tibia first, removing all excess cement, and then cementing the femur.  All loose cement was removed.  The trial liner was again inserted until cement had  completely set.  The wounds were thoroughly irrigated with normal saline pulse lavage and injected with 20cc Exparel  diluted with 30cc 0.25% marcaine  with epi.  Once the cement was set again we assessed stability and balance which we felt was appropriate with a size 8mm liner.  The real polyethylene liner was opened and inserted.   The tourniquet was let down  No significant   hemostasis was required.  The medial parapatellar arthrotomy was then reapproximated using #1 Stratafix sutures with the knee  in flexion.  The   remaining wound was closed with 0 stratafix, 2-0 Vicryl, and running 3-0 Monocryl.   The knee was cleaned, dried, dressed sterilely using Dermabond and   Aquacel dressing.  The patient was then  brought to recovery room in stable condition, tolerating the procedure  well. There were no complications.   Post op recs: WB: WBAT Abx: ancef  periop Imaging: PACU xrays DVT prophylaxis: Aspirin  81mg  BID x4 weeks Follow up: 2 weeks after surgery for a wound check with Dr. Edna at [facility].  Address: [address], [place], [place] [address]  Office Phone: [phone]  Toribio Edna, MD Orthopaedic Surgery

## 2024-03-21 NOTE — Interval H&P Note (Signed)
 The patient has been re-examined, and the chart reviewed, and there have been no interval changes to the documented history and physical.    Plan for R medial UKA vs TKA  The operative side was examined and the patient was confirmed to have sensation to DPN, SPN, TN intact, Motor EHL, ext, flex 5/5, and DP 2+, PT 2+, No significant edema.   The risks, benefits, and alternatives have been discussed at length with patient, and the patient is willing to proceed.  Right knee marked. Consent has been signed.

## 2024-03-21 NOTE — Anesthesia Postprocedure Evaluation (Signed)
 Anesthesia Post Note  Patient: Steven Gibson  Procedure(s) Performed: ARTHROPLASTY, KNEE, UNICOMPARTMENTAL (Right: Knee)     Patient location during evaluation: PACU Anesthesia Type: Regional, MAC and Spinal Level of consciousness: awake and alert and oriented Pain management: pain level controlled Vital Signs Assessment: post-procedure vital signs reviewed and stable Respiratory status: spontaneous breathing, nonlabored ventilation and respiratory function stable Cardiovascular status: blood pressure returned to baseline and stable Postop Assessment: no headache, no backache, spinal receding and no apparent nausea or vomiting Anesthetic complications: no   No notable events documented.  Last Vitals:  Vitals:   03/21/24 1515 03/21/24 1530  BP: 127/89 (!) 135/100  Pulse: 87 86  Resp: 18 (!) 30  Temp:  (!) 36.1 C  SpO2: 96% 100%    Last Pain:  Vitals:   03/21/24 1530  TempSrc:   PainSc: 3                  Almarie CHRISTELLA Marchi

## 2024-03-21 NOTE — Evaluation (Signed)
 Physical Therapy Evaluation Patient Details Name: Steven Gibson MRN: [id number] DOB: [birth year] Today's Date: 03/21/2024  History of Present Illness  53 yo male presents to therapy s/p R medial unicompartmental knee arthoplasty on 03/21/2024 due to failure of conservative measures. Pt PMH includes but is not limited to: OA, L THA (2021) and manipulation (2022),  HLD, eczema, ADD, GERD, tobacco abuse, and cardiac surgeries.  Clinical Impression      Steven Gibson is a 53 y.o. male POD 0 s/p R unicompartmental knee arthoplasty. Patient reports IND with mobility at baseline. Patient is now limited by functional impairments (see PT problem list below) and requires CGA for transfers and gait with RW. Patient was able to ambulate 60 feet with RW and CGA and cues for safe walker management. Patient educated on safe sequencing for stair mobility with one handrail, fall risk prevention, use of RW vs rollator, pain management and goal, use of ice man machine, slowly increasing activity, proper R LE positioning when at rest to facilitate R knee extension, car transfers pt  and spouse verbalized safe guarding position for people assisting with mobility. Patient instructed in exercises to facilitate ROM and circulation reviewed and HO provided. Patient will benefit from continued skilled PT interventions to address impairments and progress towards PLOF. Patient has met mobility goals at adequate level for discharge home with family support and OPPT services; will continue to follow if pt continues acute stay to progress towards Mod I goals.     If plan is discharge home, recommend the following: A little help with walking and/or transfers;A little help with bathing/dressing/bathroom;Assistance with cooking/housework;Assist for transportation;Help with stairs or ramp for entrance   Can travel by private vehicle        Equipment Recommendations Rolling walker (2 wheels)  Recommendations for  Other Services       Functional Status Assessment Patient has had a recent decline in their functional status and demonstrates the ability to make significant improvements in function in a reasonable and predictable amount of time.     Precautions / Restrictions Precautions Precautions: Knee;Fall Restrictions Weight Bearing Restrictions Per Provider Order: No      Mobility  Bed Mobility Overal bed mobility: Needs Assistance Bed Mobility: Supine to Sit     Supine to sit: Supervision     General bed mobility comments: min cues    Transfers Overall transfer level: Needs assistance Equipment used: Rolling walker (2 wheels) Transfers: Sit to/from Stand Sit to Stand: Contact guard assist           General transfer comment: min cues    Ambulation/Gait Ambulation/Gait assistance: Contact guard assist Gait Distance (Feet): 60 Feet Assistive device: Rolling walker (2 wheels) Gait Pattern/deviations: Step-to pattern, Decreased stance time - right, Antalgic, Trunk flexed Gait velocity: decreased     General Gait Details: slight trunk flexion with B UE support at RW to offload R LE in stance phase, min cues for safety and RW management  Stairs Stairs: Yes Stairs assistance: Contact guard assist Stair Management: One rail Left, Two rails Number of Stairs: 3 General stair comments: step navigation initiated with B handrails with cues for safety, sequencing and step to technique, pt able to progress to use of L handrail only and CGA with cues as above  Wheelchair Mobility     Tilt Bed    Modified Rankin (Stroke Patients Only)       Balance Overall balance assessment: Needs assistance Sitting-balance support: Feet supported Sitting balance-Leahy  Scale: Good     Standing balance support: Bilateral upper extremity supported, During functional activity, Reliant on assistive device for balance Standing balance-Leahy Scale: Fair Standing balance comment: static  standing no UE support                             Pertinent Vitals/Pain Pain Assessment Pain Assessment: 0-10 Pain Score: 6  Pain Location: R knee and LE Pain Descriptors / Indicators: Aching, Constant, Discomfort, Dull, Operative site guarding Pain Intervention(s): Limited activity within patient's tolerance, Monitored during session, Premedicated before session, Repositioned, Ice applied, Patient requesting pain meds-RN notified    Home Living Family/patient expects to be discharged to:: Private residence Living Arrangements: Spouse/significant other Available Help at Discharge: Family Type of Home: House Home Access: Stairs to enter Entrance Stairs-Rails: None Entrance Stairs-Number of Steps: 1 Alternate Level Stairs-Number of Steps: flight Home Layout: Two level;Bed/bath upstairs Home Equipment: None Additional Comments: ice man machine    Prior Function Prior Level of Function : Independent/Modified Independent;Driving;Working/employed             Mobility Comments: IND no AD for all ADLs, self care tasks and IADLs;       Extremity/Trunk Assessment        Lower Extremity Assessment Lower Extremity Assessment: RLE deficits/detail RLE Deficits / Details: ankle DF/PF 5/5; SLR < 10 degree lag RLE Sensation: WNL    Cervical / Trunk Assessment Cervical / Trunk Assessment: Normal  Communication   Communication Communication: No apparent difficulties    Cognition Arousal: Alert Behavior During Therapy: WFL for tasks assessed/performed   PT - Cognitive impairments: No apparent impairments                         Following commands: Intact       Cueing       General Comments      Exercises Total Joint Exercises Ankle Circles/Pumps: AROM, Both, 10 reps Quad Sets: AROM, Right, 5 reps Short Arc Quad: AROM, Right, 5 reps Heel Slides: AROM, Right, 5 reps Hip ABduction/ADduction: AROM, Right, 5 reps Straight Leg Raises: AROM,  Right, 5 reps Knee Flexion: AROM, Right, 5 reps, Seated   Assessment/Plan    PT Assessment Patient needs continued PT services  PT Problem List Decreased strength;Decreased range of motion;Decreased activity tolerance;Decreased balance;Decreased mobility;Decreased coordination;Pain       PT Treatment Interventions DME instruction;Gait training;Stair training;Functional mobility training;Therapeutic activities;Therapeutic exercise;Balance training;Neuromuscular re-education;Patient/family education;Modalities    PT Goals (Current goals can be found in the Care Plan section)  Acute Rehab PT Goals 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 PT Goal Formulation: With patient Time For Goal Achievement: 04/04/24 Potential to Achieve Goals: Good    Frequency 7X/week     Co-evaluation               AM-PAC PT 6 Clicks Mobility  Outcome Measure Help needed turning from your back to your side while in a flat bed without using bedrails?: None Help needed moving from lying on your back to sitting on the side of a flat bed without using bedrails?: A Little Help needed moving to and from a bed to a chair (including a wheelchair)?: A Little Help needed standing up from a chair using your arms (e.g., wheelchair or bedside chair)?: A Little Help needed to walk in hospital room?: A Little Help needed climbing 3-5 steps with a railing? :  A Little 6 Click Score: 19    End of Session Equipment Utilized During Treatment: Gait belt Activity Tolerance: Patient tolerated treatment well Patient left: in chair;with call bell/phone within reach;with family/visitor present Nurse Communication: Mobility status;Other (comment) (pt readiness for same day d/c from PT standpoint) PT Visit Diagnosis: Unsteadiness on feet (R26.81);Other abnormalities of gait and mobility (R26.89);Muscle weakness (generalized) (M62.81);Pain;Difficulty in walking, not elsewhere classified (R26.2) Pain  - Right/Left: Right Pain - part of body: Leg;Knee    Time: 8396-8355 PT Time Calculation (min) (ACUTE ONLY): 41 min   Charges:   PT Evaluation $PT Eval Low Complexity: 1 Low PT Treatments $Gait Training: 8-22 mins $Therapeutic Exercise: 8-22 mins PT General Charges $$ ACUTE PT VISIT: 1 Visit         [place], PT Acute Rehab   [place] VEAR Drone 03/21/2024, 5:21 PM

## 2024-03-21 NOTE — Transfer of Care (Signed)
 Immediate Anesthesia Transfer of Care Note  Patient: Steven Gibson  Procedure(s) Performed: ARTHROPLASTY, KNEE, UNICOMPARTMENTAL (Right: Knee)  Patient Location: PACU  Anesthesia Type:MAC, Regional, and Spinal  Level of Consciousness: awake and alert   Airway & Oxygen Therapy: Patient Spontanous Breathing and Patient connected to face mask oxygen  Post-op Assessment: Report given to RN and Post -op Vital signs reviewed and stable  Post vital signs: Reviewed and stable  Last Vitals:  Vitals Value Taken Time  BP 138/88 03/21/24 14:10  Temp    Pulse 80 03/21/24 14:13  Resp 21 03/21/24 14:13  SpO2 98 % 03/21/24 14:13  Vitals shown include unfiled device data.  Last Pain:  Vitals:   03/21/24 0949  TempSrc:   PainSc: 0-No pain      Patients Stated Pain Goal: 4 (03/21/24 0949)  Complications: No notable events documented.

## 2024-03-21 NOTE — Anesthesia Procedure Notes (Signed)
 Anesthesia Regional Block: Adductor canal block   Pre-Anesthetic Checklist: , timeout performed,  Correct Patient, Correct Site, Correct Laterality,  Correct Procedure, Correct Position, site marked,  Risks and benefits discussed,  Surgical consent,  Pre-op evaluation,  At surgeon's request and post-op pain management  Laterality: Right  Prep: Maximum Sterile Barrier Precautions used, chloraprep       Needles:  Injection technique: Single-shot  Needle Type: Echogenic Stimulator Needle     Needle Length: 9cm  Needle Gauge: 22     Additional Needles:   Procedures:,,,, ultrasound used (permanent image in chart),,    Narrative:  Start time: 03/21/2024 10:30 AM End time: 03/21/2024 10:35 AM Injection made incrementally with aspirations every 5 mL.  Performed by: Personally  Anesthesiologist: Merla Almarie HERO, DO  Additional Notes: Monitors applied. No increased pain on injection. No increased resistance to injection. Injection made in 5cc increments. Good needle visualization. Patient tolerated procedure well.

## 2024-03-22 ENCOUNTER — Encounter (HOSPITAL_COMMUNITY): Payer: Self-pay | Admitting: Orthopedic Surgery

## 2024-03-26 ENCOUNTER — Encounter: Payer: Self-pay | Admitting: Rehabilitative and Restorative Service Providers"

## 2024-03-26 ENCOUNTER — Other Ambulatory Visit: Payer: Self-pay

## 2024-03-26 ENCOUNTER — Ambulatory Visit: Attending: Orthopedic Surgery | Admitting: Rehabilitative and Restorative Service Providers"

## 2024-03-26 DIAGNOSIS — R2689 Other abnormalities of gait and mobility: Secondary | ICD-10-CM | POA: Diagnosis not present

## 2024-03-26 DIAGNOSIS — R600 Localized edema: Secondary | ICD-10-CM | POA: Insufficient documentation

## 2024-03-26 DIAGNOSIS — M25561 Pain in right knee: Secondary | ICD-10-CM | POA: Diagnosis not present

## 2024-03-26 DIAGNOSIS — M6281 Muscle weakness (generalized): Secondary | ICD-10-CM | POA: Insufficient documentation

## 2024-03-26 DIAGNOSIS — R293 Abnormal posture: Secondary | ICD-10-CM | POA: Insufficient documentation

## 2024-03-26 DIAGNOSIS — R262 Difficulty in walking, not elsewhere classified: Secondary | ICD-10-CM | POA: Diagnosis not present

## 2024-03-26 NOTE — Therapy (Signed)
 OUTPATIENT PHYSICAL THERAPY LOWER EXTREMITY EVALUATION   Patient Name: Steven Gibson MRN: [id number] DOB:[birth year], 53 y.o., male Today's Date: 03/26/2024  END OF SESSION:  PT End of Session - 03/26/24 1238     Visit Number 1    Date for Recertification  05/18/24    PT Start Time 1230    PT Stop Time 1310    PT Time Calculation (min) 40 min    Activity Tolerance Patient tolerated treatment well    Behavior During Therapy [facility] for tasks assessed/performed          Past Medical History:  Diagnosis Date   ADHD (attention deficit hyperactivity disorder)    Anxiety    Arthritis    Arthrofibrosis of knee joint, left    post op TKA    History of Graves' disease 2008   s/p  RAI  same year , on medication for a year then stablized no meds since  (per pt followed by pcp)   Hyperlipidemia    Hypertension    followed by pcp   (in care everywhere pt had stress echo, 03-26-2016, negative for ishemia and echo normal)   Hyperthyroidism    grave's disease   Pre-diabetes    Right ventricular dysfunction    per [facility] 02-20-24   Thyroid  disease    Wears glasses    Past Surgical History:  Procedure Laterality Date   ANTERIOR CRUCIATE LIGAMENT REPAIR Left 1990S   HERNIA REPAIR     JOINT REPLACEMENT  05/06/2020   KNEE ARTHROSCOPY W/ MEDIAL COLLATERAL LIGAMENT (MCL) REPAIR Left 1990s   KNEE ARTHROSCOPY W/ MENISCAL REPAIR Left 1990s   KNEE CLOSED REDUCTION Left 10/21/2020   Procedure: CLOSED MANIPULATION LEFT KNEE;  Surgeon: Josefina Chew, MD;  Location: [facility];  Service: Orthopedics;  Laterality: Left;   LIPOMA EXCISION     LEF SHOULDER AREA   PARTIAL KNEE ARTHROPLASTY Right 03/21/2024   Procedure: ARTHROPLASTY, KNEE, UNICOMPARTMENTAL;  Surgeon: Edna Toribio LABOR, MD;  Location: [facility];  Service: Orthopedics;  Laterality: Right;   RIGHT HEART CATH N/A 02/20/2024   Procedure: RIGHT HEART CATH;  Surgeon: Mady Bruckner, MD;  Location: [facility];   Service: Cardiovascular;  Laterality: N/A;   TOTAL KNEE ARTHROPLASTY Left 05/06/2020   Procedure: TOTAL KNEE ARTHROPLASTY;  Surgeon: Josefina Chew, MD;  Location: [facility];  Service: Orthopedics;  Laterality: Left;   UMBILICAL HERNIA REPAIR  07-17-2015  @[email or web address]    AND RIGHT ABDOMINAL WALL EXCISION LIPOMA   Patient Active Problem List   Diagnosis Date Noted   Abnormal EKG 01/30/2024   Need for influenza vaccination 01/30/2024   Pre-op exam 01/30/2024   Class 1 obesity due to excess calories with serious comorbidity and body mass index (BMI) of 32.0 to 32.9 in adult 08/30/2023   Seasonal allergies 08/30/2023   Acute cough 08/30/2023   Rash and nonspecific skin eruption 04/13/2023   Encounter for annual health examination 04/07/2023   COVID-19 vaccination declined 04/07/2023   Influenza vaccination declined 04/07/2023   Herpes zoster vaccination declined 04/07/2023   Encounter for prostate cancer screening 04/07/2023   Impacted cerumen of right ear 04/07/2023   Arthritis 09/01/2022   Prediabetes 05/03/2022   Class 1 obesity due to excess calories with serious comorbidity and body mass index (BMI) of 31.0 to 31.9 in adult 05/03/2022   Complication associated with orthopedic device 02/10/2022   Mood disorder 08/18/2021   Osteoarthritis of left knee 06/04/2021   S/P total knee arthroplasty, left  05/06/2020   Eczema 04/17/2019   Mixed hyperlipidemia 08/02/2018   Numbness and tingling in both hands 08/02/2018   Essential hypertension 07/24/2018   H/O Graves' disease 07/24/2018   Attention deficit disorder 07/24/2018   Dyslipidemia 03/26/2016   Family history of premature CAD 03/26/2016   Tobacco abuse 08/13/2014   GERD without esophagitis 08/13/2014   Lumbar herniated disc 08/13/2014    PCP: Georgina Speaks, FNP  REFERRING PROVIDER: Edna Toribio LABOR, MD  REFERRING DIAG: S/P right medial unicompartmental knee arthroplasty on 03/21/24  THERAPY DIAG:  Right knee pain,  unspecified chronicity  Difficulty in walking, not elsewhere classified  Muscle weakness (generalized)  Other abnormalities of gait and mobility  Localized edema  Rationale for Evaluation and Treatment: Rehabilitation  ONSET DATE: S/P right medial unicompartmental knee arthroplasty on 03/21/24  SUBJECTIVE:   SUBJECTIVE STATEMENT: Pt reports that he has been doing well since surgery.  Pt reports that he has been doing exercises since his surgery.  Patient and wife report that there is some itching on the incision area.  PERTINENT HISTORY: S/P right medial unicompartmental knee arthroplasty on 03/21/24 HTN, Graves Disease, OA, Left Knee TKA 05/06/2020   PAIN:  Are you having pain? Yes: NPRS scale: 2-3/10 Pain location: right medial knee Pain description: sore Aggravating factors: sudden steps when walking Relieving factors: cold packs  PRECAUTIONS: None  RED FLAGS: None   WEIGHT BEARING RESTRICTIONS: No  FALLS:  Has patient fallen in last 6 months? 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  NEXT MD VISIT: Dr Edna on 04/05/2024  OBJECTIVE:  Note: Objective measures were completed at Evaluation unless otherwise noted.  DIAGNOSTIC FINDINGS:   Right knee radiograph on 03/21/2024: IMPRESSION: 1. Medial hemiarthroplasty in expected alignment. No immediate complications.  PATIENT SURVEYS:  LEFS  Extreme difficulty/unable (0), Quite a bit of difficulty (1), Moderate difficulty (2), Little difficulty (3), No difficulty (4) Survey date:  03/26/24  Any of your usual work, housework or school activities  0  2. Usual hobbies, recreational or sporting activities 0  3. Getting into/out of the bath 0  4. Walking between rooms 2  5. Putting on socks/shoes 1  6. Squatting  0  7. Lifting an object, like a bag of groceries from the floor 0  8. Performing light activities around your home 1  9. Performing heavy activities around your home 0  10. Getting into/out of a car 0  11. Walking 2 blocks 0  12. Walking 1 mile 0  13. Going up/down 10 stairs (1 flight) 0  14. Standing for 1 hour 0  15.  sitting for 1 hour 1  16. Running on even ground 0  17. Running on uneven ground 0  18. Making sharp turns while running fast 0  19. Hopping  0  20. Rolling over in bed 1  Score total:  6/80 = 7.5%     COGNITION: Overall cognitive status: Within functional limits for tasks assessed     SENSATION: Patient states that the numbness has gone away, but he is still having some itching  EDEMA:  Circumferential: Right- 44.5 cm,  Left- 43 cm  MUSCLE LENGTH: Hamstrings: tightness bilat   PALPATION: Minimal tender to palpation around his knee  LOWER EXTREMITY ROM:  Active ROM Right eval Left eval  Knee flexion 90 107  Knee extension -20 0   (Blank rows = not tested)  LOWER EXTREMITY MMT:  Eval: Left LE strength:  5-/5 Right hip strength is 4/5 Right knee strength of 3- to 3/5   FUNCTIONAL TESTS:  5 times sit to stand: 26.23 sec with poor eccentric muscle control with stand to sit Timed up and go (TUG): 17.77 sec without AD, 12.72 sec with RW  GAIT: Distance walked: >50 ft Assistive device utilized: Environmental Consultant - 2 wheeled Level of assistance: Modified independence Comments: Antalgic                                                                                                                                 TREATMENT DATE:  03/26/2024: Reviewed role of PT and  Reviewed HEP  Nustep level 2 x 5 min with PT present to discuss status   PATIENT EDUCATION:  Education details: Issued  HEP Person educated: Patient Education method: Explanation, Demonstration, and Handouts Education comprehension: verbalized understanding  HOME EXERCISE PROGRAM: Access Code: [email or web address] URL: [email or web address] Date: 03/26/2024 Prepared by: Jarrell Laming  Exercises - Active Straight Leg Raise with Quad Set  - 1 x daily - 7 x weekly - 2 sets - 10 reps - Supine Heel Slide  - 1 x daily - 7 x weekly - 2 sets - 10 reps - Sit to Stand  - 1 x daily - 7 x weekly - 2 sets - 5 reps - Seated Long Arc Quad  - 1 x daily - 7 x weekly - 2 sets - 10 reps - Seated Heel Slide  - 1 x daily - 7 x weekly - 2 sets - 10 reps - Seated Quad Set  - 1 x daily - 7 x weekly - 2 sets - 10 reps - Standing Hip Abduction with Counter Support  - 1 x daily - 7 x weekly - 2 sets - 10 reps - Heel Raises with Counter Support  - 1 x daily - 7 x weekly - 2 sets - 10 reps - Standing March with Counter Support  - 1 x daily - 7 x weekly - 2 sets - 10 reps - Standing Knee Flexion with Unilateral Counter Support  - 1 x daily - 7 x weekly - 2 sets - 10 reps  ASSESSMENT:  CLINICAL IMPRESSION:  Patient is a 53 y.o. male who was seen today for physical therapy evaluation and treatment for s/p right medial unicompartmental knee arthroplasty on 03/21/2024. Patient is known to this PT from a previous admission prior to his surgery.  Patient presents to PT for evaluation stating that he has been trying to do some of the exercises that he remembered from before.  Patient presents with decreased knee ROM, muscle  weakness, difficulty walking, and difficulty performing functional tasks.  Patient would benefit from skilled PT to progress towards goal related activities.  OBJECTIVE IMPAIRMENTS: Abnormal gait, decreased activity tolerance, decreased balance, difficulty walking, decreased ROM, decreased strength, increased muscle spasms, impaired flexibility, and pain.   ACTIVITY LIMITATIONS: carrying, lifting, bending, standing,  squatting, stairs, bed mobility, and locomotion level  PARTICIPATION LIMITATIONS: cleaning, community activity, and occupation  PERSONAL FACTORS: Time since onset of injury/illness/exacerbation and 3+ comorbidities: OA, s/p L THA, Graves Disease are also affecting patient's functional outcome.   REHAB POTENTIAL: Good  CLINICAL DECISION MAKING: Evolving/moderate complexity  EVALUATION COMPLEXITY: Moderate   GOALS: Goals reviewed with patient? Yes  SHORT TERM GOALS: Target date: 04/16/2024 Patient will be independent with initial HEP.  Baseline: Goal status: INITIAL  2.  Patient to participate in 6 minute walk test to establish a baseline.  Baseline:  Goal status: INITIAL  3.  Patient will improve right knee A/ROM to 10 to 95 degrees to allow for improved gait pattern. Baseline:  Goal status: INITIAL   LONG TERM GOALS: Target date: 05/18/2024  Patient will be independent with advanced HEP to promote continued exercises post discharge.  Baseline:  Goal status: INITIAL  2.  Patient will increase Lower Extremity Functional Scale to at least 50% to demonstrate increased independence with functional activities.  Baseline: 7.5% Goal status: INITIAL  3.  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  Baseline:  Goal status: INITIAL  4.  Patient will report ability to start a walking/jogging exercise program without increased pain.  Baseline:  Goal status: INITIAL  5.  Patient will increase his strength to [facility] to allow him to navigate stairs with reciprocal pattern.  Baseline:  Goal status: INITIAL  6.  Patient will increase knee A/ROM to 5 to 115 degrees to allow him to demonstrate skills for basketball practices. Baseline:  Goal status: INITIAL   PLAN:  PT FREQUENCY: 2-3x/week  PT DURATION: 8 weeks  PLANNED INTERVENTIONS: 97164- PT Re-evaluation, 97750- Physical Performance Testing, 97110-Therapeutic exercises, 97530- Therapeutic  activity, 97112- Neuromuscular re-education, 97535- Self Care, [id number]- Manual therapy, [phone]- Gait training, [phone]- Canalith repositioning, [id number]- Aquatic Therapy, [phone]- Electrical stimulation (unattended), [phone]- Electrical stimulation (manual), [id number]- Vasopneumatic device, [id number]- Ultrasound, [id number]- Ionotophoresis 4mg /ml Dexamethasone , [id number] (1-2 muscles), [id number] (3+ muscles)- Dry Needling, Patient/Family education, Balance training, Stair training, Taping, Joint mobilization, Joint manipulation, Scar mobilization, Vestibular training, DME instructions, Cryotherapy, and Moist heat  PLAN FOR NEXT SESSION: Assess and progress HEP as indicated, strengthening, flexibility, vasocompression/cold pack as indicated   Jarrell Laming, PT, DPT 03/26/24, 1:31 PM  [facility] [address], [address] [place], [place] [address] Phone # [phone] Fax [phone]

## 2024-03-27 ENCOUNTER — Other Ambulatory Visit: Payer: Self-pay

## 2024-03-28 ENCOUNTER — Ambulatory Visit

## 2024-03-28 DIAGNOSIS — R252 Cramp and spasm: Secondary | ICD-10-CM

## 2024-03-28 DIAGNOSIS — R2689 Other abnormalities of gait and mobility: Secondary | ICD-10-CM | POA: Diagnosis not present

## 2024-03-28 DIAGNOSIS — R262 Difficulty in walking, not elsewhere classified: Secondary | ICD-10-CM | POA: Diagnosis not present

## 2024-03-28 DIAGNOSIS — M6281 Muscle weakness (generalized): Secondary | ICD-10-CM

## 2024-03-28 DIAGNOSIS — M25561 Pain in right knee: Secondary | ICD-10-CM

## 2024-03-28 DIAGNOSIS — R600 Localized edema: Secondary | ICD-10-CM | POA: Diagnosis not present

## 2024-03-28 DIAGNOSIS — R293 Abnormal posture: Secondary | ICD-10-CM | POA: Diagnosis not present

## 2024-03-28 NOTE — Therapy (Signed)
 OUTPATIENT PHYSICAL THERAPY LOWER EXTREMITY EVALUATION   Patient Name: Steven Gibson MRN: [id number] DOB:1971-04-29, 53 y.o., male Today's Date: 03/28/2024  END OF SESSION:  PT End of Session - 03/28/24 0811     Visit Number 2    Date for Recertification  05/18/24    PT Start Time 0810    PT Stop Time 0851    PT Time Calculation (min) 41 min    Equipment Utilized During Treatment Gait belt    Behavior During Therapy [facility] for tasks assessed/performed          Past Medical History:  Diagnosis Date   ADHD (attention deficit hyperactivity disorder)    Anxiety    Arthritis    Arthrofibrosis of knee joint, left    post op TKA    History of Graves' disease 2008   s/p  RAI  same year , on medication for a year then stablized no meds since  (per pt followed by pcp)   Hyperlipidemia    Hypertension    followed by pcp   (in care everywhere pt had stress echo, 03-26-2016, negative for ishemia and echo normal)   Hyperthyroidism    grave's disease   Pre-diabetes    Right ventricular dysfunction    per [facility] 02-20-24   Thyroid  disease    Wears glasses    Past Surgical History:  Procedure Laterality Date   ANTERIOR CRUCIATE LIGAMENT REPAIR Left 1990S   HERNIA REPAIR     JOINT REPLACEMENT  05/06/2020   KNEE ARTHROSCOPY W/ MEDIAL COLLATERAL LIGAMENT (MCL) REPAIR Left 1990s   KNEE ARTHROSCOPY W/ MENISCAL REPAIR Left 1990s   KNEE CLOSED REDUCTION Left 10/21/2020   Procedure: CLOSED MANIPULATION LEFT KNEE;  Surgeon: Josefina Chew, MD;  Location: [facility];  Service: Orthopedics;  Laterality: Left;   LIPOMA EXCISION     LEF SHOULDER AREA   PARTIAL KNEE ARTHROPLASTY Right 03/21/2024   Procedure: ARTHROPLASTY, KNEE, UNICOMPARTMENTAL;  Surgeon: Edna Toribio LABOR, MD;  Location: [facility];  Service: Orthopedics;  Laterality: Right;   RIGHT HEART CATH N/A 02/20/2024   Procedure: RIGHT HEART CATH;  Surgeon: Mady Bruckner, MD;  Location: [facility];   Service: Cardiovascular;  Laterality: N/A;   TOTAL KNEE ARTHROPLASTY Left 05/06/2020   Procedure: TOTAL KNEE ARTHROPLASTY;  Surgeon: Josefina Chew, MD;  Location: [facility];  Service: Orthopedics;  Laterality: Left;   UMBILICAL HERNIA REPAIR  07-17-2015  @[email or web address]    AND RIGHT ABDOMINAL WALL EXCISION LIPOMA   Patient Active Problem List   Diagnosis Date Noted   Abnormal EKG 01/30/2024   Need for influenza vaccination 01/30/2024   Pre-op exam 01/30/2024   Class 1 obesity due to excess calories with serious comorbidity and body mass index (BMI) of 32.0 to 32.9 in adult 08/30/2023   Seasonal allergies 08/30/2023   Acute cough 08/30/2023   Rash and nonspecific skin eruption 04/13/2023   Encounter for annual health examination 04/07/2023   COVID-19 vaccination declined 04/07/2023   Influenza vaccination declined 04/07/2023   Herpes zoster vaccination declined 04/07/2023   Encounter for prostate cancer screening 04/07/2023   Impacted cerumen of right ear 04/07/2023   Arthritis 09/01/2022   Prediabetes 05/03/2022   Class 1 obesity due to excess calories with serious comorbidity and body mass index (BMI) of 31.0 to 31.9 in adult 05/03/2022   Complication associated with orthopedic device 02/10/2022   Mood disorder 08/18/2021   Osteoarthritis of left knee 06/04/2021   S/P total knee arthroplasty, left  05/06/2020   Eczema 04/17/2019   Mixed hyperlipidemia 08/02/2018   Numbness and tingling in both hands 08/02/2018   Essential hypertension 07/24/2018   H/O Graves' disease 07/24/2018   Attention deficit disorder 07/24/2018   Dyslipidemia 03/26/2016   Family history of premature CAD 03/26/2016   Tobacco abuse 08/13/2014   GERD without esophagitis 08/13/2014   Lumbar herniated disc 08/13/2014    PCP: Georgina Speaks, FNP  REFERRING PROVIDER: Edna Toribio LABOR, MD  REFERRING DIAG: S/P right medial unicompartmental knee arthroplasty on 03/21/24  THERAPY DIAG:  Right knee pain,  unspecified chronicity  Difficulty in walking, not elsewhere classified  Muscle weakness (generalized)  Localized edema  Cramp and spasm  Abnormal posture  Rationale for Evaluation and Treatment: Rehabilitation  ONSET DATE: S/P right medial unicompartmental knee arthroplasty on 03/21/24  SUBJECTIVE:   SUBJECTIVE STATEMENT: Pt reports doing well.  Pain 2/10.  Arrives with 2 wheel walker with good step length and gait speed.    PERTINENT HISTORY: S/P right medial unicompartmental knee arthroplasty on 03/21/24 HTN, Graves Disease, OA, Left Knee TKA 05/06/2020   PAIN:  03/28/24 Are you having pain? Yes: NPRS scale: 2/10 Pain location: right medial knee Pain description: sore Aggravating factors: sudden steps when walking Relieving factors: cold packs  PRECAUTIONS: None  RED FLAGS: None   WEIGHT BEARING RESTRICTIONS: No  FALLS:  Has patient fallen in last 6 months? 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  NEXT MD VISIT: Dr Edna on 04/05/2024  OBJECTIVE:  Note: Objective measures were completed at Evaluation unless otherwise noted.  DIAGNOSTIC FINDINGS:   Right knee radiograph on 03/21/2024: IMPRESSION: 1. Medial hemiarthroplasty in expected alignment. No immediate complications.  PATIENT SURVEYS:  LEFS  Extreme difficulty/unable (0), Quite a bit of difficulty (1), Moderate difficulty (2), Little difficulty (3), No difficulty (4) Survey date:  03/26/24  Any of your usual work, housework or school activities 0  2. Usual hobbies, recreational or sporting activities 0  3. Getting into/out of  the bath 0  4. Walking between rooms 2  5. Putting on socks/shoes 1  6. Squatting  0  7. Lifting an object, like a bag of groceries from the floor 0  8. Performing light activities around your home 1  9. Performing heavy activities around your home 0  10. Getting into/out of a car 0  11. Walking 2 blocks 0  12. Walking 1 mile 0  13. Going up/down 10 stairs (1 flight) 0  14. Standing for 1 hour 0  15.  sitting for 1 hour 1  16. Running on even ground 0  17. Running on uneven ground 0  18. Making sharp turns while running fast 0  19. Hopping  0  20. Rolling over in bed 1  Score total:  6/80 = 7.5%     COGNITION: Overall cognitive status: Within functional limits for tasks assessed     SENSATION: Patient states that the numbness has gone away, but he is still having some itching  EDEMA:  Circumferential: Right- 44.5 cm, Left- 43 cm  MUSCLE LENGTH: Hamstrings: tightness bilat   PALPATION: Minimal  tender to palpation around his knee  LOWER EXTREMITY ROM:  Active ROM Right eval Left eval  Knee flexion 90 107  Knee extension -20 0   (Blank rows = not tested)  LOWER EXTREMITY MMT:  Eval: Left LE strength:  5-/5 Right hip strength is 4/5 Right knee strength of 3- to 3/5   FUNCTIONAL TESTS:  5 times sit to stand: 26.23 sec with poor eccentric muscle control with stand to sit Timed up and go (TUG): 17.77 sec without AD, 12.72 sec with RW  GAIT: Distance walked: >50 ft Assistive device utilized: Environmental Consultant - 2 wheeled Level of assistance: Modified independence Comments: Antalgic                                                                                                                                 TREATMENT DATE:  03/28/2024: Nustep x 5 min level 5 Supine quad sets x 20 Supine heel slides x 20 Supine hip abduction x 20 Supine TKE over large green noodle Supine SAQ over larger blue bolster Supine SLR  2 x 10 Sit to stand 2 x 5 without UE  support Seated LAQ x 20 Gait training with SPC, emphasis on proper step length and heel strike to toe off : educated on gradual transition to cane  Game ready x 15 min at medium compression 3 snowflakes  03/26/2024: Reviewed role of PT and  Reviewed HEP  Nustep level 2 x 5 min with PT present to discuss status   PATIENT EDUCATION:  Education details: Issued HEP Person educated: Patient Education method: Explanation, Demonstration, and Handouts Education comprehension: verbalized understanding  HOME EXERCISE PROGRAM: Access Code: [email or web address] URL: [email or web address] Date: 03/28/2024 Prepared by: Delon Haddock  Exercises - Active Straight Leg Raise with Quad Set  - 1 x daily - 7 x weekly - 2 sets - 10 reps - Supine Heel Slide  - 1 x daily - 7 x weekly - 2 sets - 10 reps - Sit to Stand  - 1 x daily - 7 x weekly - 2 sets - 5 reps - Seated Long Arc Quad  - 1 x daily - 7 x weekly - 2 sets - 10 reps - Seated Heel Slide  - 1 x daily - 7 x weekly - 2 sets - 10 reps - Seated Quad Set  - 1 x daily - 7 x weekly - 2 sets - 10 reps - Standing Hip Abduction with Counter Support  - 1 x daily - 7 x weekly - 2 sets - 10 reps - Heel Raises with Counter Support  - 1 x daily - 7 x weekly - 2 sets - 10 reps - Standing March with Counter Support  - 1 x daily - 7 x weekly - 2 sets - 10 reps - Standing Knee Flexion with Unilateral Counter Support  - 1 x daily - 7 x weekly - 2 sets - 10 reps - Supine Hip  Internal and External Rotation  - 1 x daily - 7 x weekly - 1 sets - 10 reps - Supine Hip Abduction  - 1 x daily - 7 x weekly - 2 sets - 10 reps  ASSESSMENT:  CLINICAL IMPRESSION: Patient is progressing appropriately.  Excellent compliance with HEP.  He is lacking full extension but tolerated extension stretching well.  We worked on normalizing gait with emphasis on heel to toe progression using SPC.  He demonstrates good balance and weight shift.  He has good quad activity at end range  and gaining ROM appropriately.  He should continue to do well.  He would benefit from continuing skilled PT for post partial knee replacement.    OBJECTIVE IMPAIRMENTS: Abnormal gait, decreased activity tolerance, decreased balance, difficulty walking, decreased ROM, decreased strength, increased muscle spasms, impaired flexibility, and pain.   ACTIVITY LIMITATIONS: carrying, lifting, bending, standing, squatting, stairs, bed mobility, and locomotion level  PARTICIPATION LIMITATIONS: cleaning, community activity, and occupation  PERSONAL FACTORS: Time since onset of injury/illness/exacerbation and 3+ comorbidities: OA, s/p L THA, Graves Disease are also affecting patient's functional outcome.   REHAB POTENTIAL: Good  CLINICAL DECISION MAKING: Evolving/moderate complexity  EVALUATION COMPLEXITY: Moderate   GOALS: Goals reviewed with patient? Yes  SHORT TERM GOALS: Target date: 04/16/2024 Patient will be independent with initial HEP.  Baseline: Goal status: MET 03/28/24  2.  Patient to participate in 6 minute walk test to establish a baseline.  Baseline:  Goal status: INITIAL  3.  Patient will improve right knee A/ROM to 10 to 95 degrees to allow for improved gait pattern. Baseline:  Goal status: INITIAL   LONG TERM GOALS: Target date: 05/18/2024  Patient will be independent with advanced HEP to promote continued exercises post discharge.  Baseline:  Goal status: INITIAL  2.  Patient will increase Lower Extremity Functional Scale to at least 50% to demonstrate increased independence with functional activities.  Baseline: 7.5% Goal status: INITIAL  3.  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  Baseline:  Goal status: INITIAL  4.  Patient will report ability to start a walking/jogging exercise program without increased pain.  Baseline:  Goal status: INITIAL  5.  Patient will increase his strength to [facility] to allow him to navigate stairs  with reciprocal pattern.  Baseline:  Goal status: INITIAL  6.  Patient will increase knee A/ROM to 5 to 115 degrees to allow him to demonstrate skills for basketball practices. Baseline:  Goal status: INITIAL   PLAN:  PT FREQUENCY: 2-3x/week  PT DURATION: 8 weeks  PLANNED INTERVENTIONS: [id number]- PT Re-evaluation, 97750- Physical Performance Testing, 97110-Therapeutic exercises, 97530- Therapeutic activity, [id number]- Neuromuscular re-education, 97535- Self Care, [id number]- Manual therapy, [id number]- Gait training, [phone]- Canalith repositioning, [id number]- Aquatic Therapy, [phone]- Electrical stimulation (unattended), [phone]- Electrical stimulation (manual), [id number]- Vasopneumatic device, [id number]- Ultrasound, [id number]- Ionotophoresis 4mg /ml Dexamethasone , [id number] (1-2 muscles), [id number] (3+ muscles)- Dry Needling, Patient/Family education, Balance training, Stair training, Taping, Joint mobilization, Joint manipulation, Scar mobilization, Vestibular training, DME instructions, Cryotherapy, and Moist heat  PLAN FOR NEXT SESSION: Nustep, continue quad rehab, ROM and functional training.     Delon B. Aprill Banko, PT 03/28/24 8:53 AM [facility] [address] [place], [place] [address] Phone # [phone] Fax [phone]

## 2024-03-30 ENCOUNTER — Other Ambulatory Visit: Payer: Self-pay

## 2024-03-30 ENCOUNTER — Ambulatory Visit

## 2024-03-30 DIAGNOSIS — R252 Cramp and spasm: Secondary | ICD-10-CM

## 2024-03-30 DIAGNOSIS — R293 Abnormal posture: Secondary | ICD-10-CM

## 2024-03-30 DIAGNOSIS — M25661 Stiffness of right knee, not elsewhere classified: Secondary | ICD-10-CM

## 2024-03-30 DIAGNOSIS — M25561 Pain in right knee: Secondary | ICD-10-CM

## 2024-03-30 DIAGNOSIS — R600 Localized edema: Secondary | ICD-10-CM

## 2024-03-30 DIAGNOSIS — R262 Difficulty in walking, not elsewhere classified: Secondary | ICD-10-CM | POA: Diagnosis not present

## 2024-03-30 DIAGNOSIS — R2689 Other abnormalities of gait and mobility: Secondary | ICD-10-CM | POA: Diagnosis not present

## 2024-03-30 DIAGNOSIS — M6281 Muscle weakness (generalized): Secondary | ICD-10-CM | POA: Diagnosis not present

## 2024-03-30 NOTE — Therapy (Signed)
 OUTPATIENT PHYSICAL THERAPY LOWER EXTREMITY EVALUATION   Patient Name: Steven Gibson MRN: [id number] DOB:1970/10/05, 53 y.o., male Today's Date: 03/30/2024  END OF SESSION:  PT End of Session - 03/30/24 0823     Visit Number 3    Date for Recertification  05/18/24    Authorization Type BCBS    Authorization Time Period [employer] CAR[employer] AUTH REQ AT 27 VISITS $4 COPAY    PT Start Time 0806    PT Stop Time 0845    PT Time Calculation (min) 39 min    Equipment Utilized During Treatment Gait belt    Activity Tolerance Patient tolerated treatment well    Behavior During Therapy WFL for tasks assessed/performed          Past Medical History:  Diagnosis Date   ADHD (attention deficit hyperactivity disorder)    Anxiety    Arthritis    Arthrofibrosis of knee joint, left    post op TKA    History of Graves' disease 2008   s/p  RAI  same year , on medication for a year then stablized no meds since  (per pt followed by pcp)   Hyperlipidemia    Hypertension    followed by pcp   (in care everywhere pt had stress echo, 03-26-2016, negative for ishemia and echo normal)   Hyperthyroidism    grave's disease   Pre-diabetes    Right ventricular dysfunction    per [facility] 02-20-24   Thyroid  disease    Wears glasses    Past Surgical History:  Procedure Laterality Date   ANTERIOR CRUCIATE LIGAMENT REPAIR Left 1990S   HERNIA REPAIR     JOINT REPLACEMENT  05/06/2020   KNEE ARTHROSCOPY W/ MEDIAL COLLATERAL LIGAMENT (MCL) REPAIR Left 1990s   KNEE ARTHROSCOPY W/ MENISCAL REPAIR Left 1990s   KNEE CLOSED REDUCTION Left 10/21/2020   Procedure: CLOSED MANIPULATION LEFT KNEE;  Surgeon: Josefina Chew, MD;  Location: [facility];  Service: Orthopedics;  Laterality: Left;   LIPOMA EXCISION     LEF SHOULDER AREA   PARTIAL KNEE ARTHROPLASTY Right 03/21/2024   Procedure: ARTHROPLASTY, KNEE, UNICOMPARTMENTAL;  Surgeon: Edna Toribio LABOR, MD;  Location: [facility];   Service: Orthopedics;  Laterality: Right;   RIGHT HEART CATH N/A 02/20/2024   Procedure: RIGHT HEART CATH;  Surgeon: Mady Bruckner, MD;  Location: [facility];  Service: Cardiovascular;  Laterality: N/A;   TOTAL KNEE ARTHROPLASTY Left 05/06/2020   Procedure: TOTAL KNEE ARTHROPLASTY;  Surgeon: Josefina Chew, MD;  Location: [facility];  Service: Orthopedics;  Laterality: Left;   UMBILICAL HERNIA REPAIR  07-17-2015  @[email or web address]    AND RIGHT ABDOMINAL WALL EXCISION LIPOMA   Patient Active Problem List   Diagnosis Date Noted   Abnormal EKG 01/30/2024   Need for influenza vaccination 01/30/2024   Pre-op exam 01/30/2024   Class 1 obesity due to excess calories with serious comorbidity and body mass index (BMI) of 32.0 to 32.9 in adult 08/30/2023   Seasonal allergies 08/30/2023   Acute cough 08/30/2023   Rash and nonspecific skin eruption 04/13/2023   Encounter for annual health examination 04/07/2023   COVID-19 vaccination declined 04/07/2023   Influenza vaccination declined 04/07/2023   Herpes zoster vaccination declined 04/07/2023   Encounter for prostate cancer screening 04/07/2023   Impacted cerumen of right ear 04/07/2023   Arthritis 09/01/2022   Prediabetes 05/03/2022   Class 1 obesity due to excess calories with serious comorbidity and body mass index (BMI) of 31.0 to  31.9 in adult 05/03/2022   Complication associated with orthopedic device 02/10/2022   Mood disorder 08/18/2021   Osteoarthritis of left knee 06/04/2021   S/P total knee arthroplasty, left 05/06/2020   Eczema 04/17/2019   Mixed hyperlipidemia 08/02/2018   Numbness and tingling in both hands 08/02/2018   Essential hypertension 07/24/2018   H/O Graves' disease 07/24/2018   Attention deficit disorder 07/24/2018   Dyslipidemia 03/26/2016   Family history of premature CAD 03/26/2016   Tobacco abuse 08/13/2014   GERD without esophagitis 08/13/2014   Lumbar herniated disc 08/13/2014    PCP: Georgina Speaks,  FNP  REFERRING PROVIDER: Edna Toribio LABOR, MD  REFERRING DIAG: S/P right medial unicompartmental knee arthroplasty on 03/21/24  THERAPY DIAG:  Right knee pain, unspecified chronicity  Stiffness of right knee, not elsewhere classified  Muscle weakness (generalized)  Cramp and spasm  Difficulty in walking, not elsewhere classified  Abnormal posture  Localized edema  Rationale for Evaluation and Treatment: Rehabilitation  ONSET DATE: S/P right medial unicompartmental knee arthroplasty on 03/21/24  SUBJECTIVE:   SUBJECTIVE STATEMENT: Pt reports doing well.  Pain 2/10.  Arrives with 2 wheel walker with good step length and gait speed.    PERTINENT HISTORY: S/P right medial unicompartmental knee arthroplasty on 03/21/24 HTN, Graves Disease, OA, Left Knee TKA 05/06/2020   PAIN:  03/28/24 Are you having pain? Yes: NPRS scale: 2/10 Pain location: right medial knee Pain description: sore Aggravating factors: sudden steps when walking Relieving factors: cold packs  PRECAUTIONS: None  RED FLAGS: None   WEIGHT BEARING RESTRICTIONS: No  FALLS:  Has patient fallen in last 6 months? 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  NEXT MD VISIT: Dr Edna on 04/05/2024  OBJECTIVE:  Note: Objective measures were completed at Evaluation unless otherwise noted.  DIAGNOSTIC FINDINGS:   Right knee radiograph on 03/21/2024: IMPRESSION: 1. Medial hemiarthroplasty in expected alignment. No immediate complications.  PATIENT SURVEYS:  LEFS  Extreme difficulty/unable (0), Quite a bit of difficulty (1), Moderate  difficulty (2), Little difficulty (3), No difficulty (4) Survey date:  03/26/24  Any of your usual work, housework or school activities 0  2. Usual hobbies, recreational or sporting activities 0  3. Getting into/out of the bath 0  4. Walking between rooms 2  5. Putting on socks/shoes 1  6. Squatting  0  7. Lifting an object, like a bag of groceries from the floor 0  8. Performing light activities around your home 1  9. Performing heavy activities around your home 0  10. Getting into/out of a car 0  11. Walking 2 blocks 0  12. Walking 1 mile 0  13. Going up/down 10 stairs (1 flight) 0  14. Standing for 1 hour 0  15.  sitting for 1 hour 1  16. Running on even ground 0  17. Running on uneven ground 0  18. Making sharp turns while running fast 0  19. Hopping  0  20. Rolling over in bed 1  Score total:  6/80 = 7.5%     COGNITION: Overall cognitive status: Within functional limits for tasks assessed  SENSATION: Patient states that the numbness has gone away, but he is still having some itching  EDEMA:  Circumferential: Right- 44.5 cm, Left- 43 cm  MUSCLE LENGTH: Hamstrings: tightness bilat   PALPATION: Minimal tender to palpation around his knee  LOWER EXTREMITY ROM:  Active ROM Right eval Left eval  Knee flexion 90 107  Knee extension -20 0   (Blank rows = not tested)  LOWER EXTREMITY MMT:  Eval: Left LE strength:  5-/5 Right hip strength is 4/5 Right knee strength of 3- to 3/5   FUNCTIONAL TESTS:  5 times sit to stand: 26.23 sec with poor eccentric muscle control with stand to sit Timed up and go (TUG): 17.77 sec without AD, 12.72 sec with RW  GAIT: Distance walked: >50 ft Assistive device utilized: Environmental Consultant - 2 wheeled Level of assistance: Modified independence Comments: Antalgic                                                                                                                                 TREATMENT DATE:  03/30/2024: Nustep x 5  min level 7 min Seated LAQ 2 x 10 with 5 lbs Seated march x 20 with 5 lbs Standing HS curl with 5 lbs Sit to stand 2 x 5 without UE support Step up and hold 2 x 10 fwd and lateral  Supine quad sets x 20 Palpated calf as patient was c/o more obvious tightness: He did not have any s/s of DVT but he is aware of these s/s and will monitor for any indication of this.  This appears to be just tightness.  He had 3 Bakers cysts before his partial replacement with all 3 rupturing spontaneously.  Popliteal area appears to be quite scarred and tight on palpation.   Rocker board x 2 min  Gastroc stretch on rocker board (both) 10 x 10 sec Soleus stretch 10 x 10 sec right only Patient declined ice  03/28/2024: Nustep x 5 min level 5 Supine quad sets x 20 Supine heel slides x 20 Supine hip abduction x 20 Supine TKE over large green noodle Supine SAQ over larger blue bolster Supine SLR  2 x 10 Sit to stand 2 x 5 without UE support Seated LAQ x 20 Gait training with SPC, emphasis on proper step length and heel strike to toe off : educated on gradual transition to cane  Game ready x 15 min at medium compression 3 snowflakes  03/26/2024: Reviewed role of PT and  Reviewed HEP  Nustep level 2 x 5 min with PT present to discuss status   PATIENT EDUCATION:  Education details: Issued HEP Person educated: Patient Education method: Explanation, Demonstration, and Handouts Education comprehension: verbalized understanding  HOME EXERCISE PROGRAM: Access Code: [email or web address] URL: [email or web address] Date: 03/28/2024 Prepared by: Delon Haddock  Exercises - Active Straight Leg Raise with Quad Set  - 1 x daily - 7 x weekly - 2 sets - 10 reps -  Supine Heel Slide  - 1 x daily - 7 x weekly - 2 sets - 10 reps - Sit to Stand  - 1 x daily - 7 x weekly - 2 sets - 5 reps - Seated Long Arc Quad  - 1 x daily - 7 x weekly - 2 sets - 10 reps - Seated Heel Slide  - 1 x daily - 7 x weekly - 2 sets -  10 reps - Seated Quad Set  - 1 x daily - 7 x weekly - 2 sets - 10 reps - Standing Hip Abduction with Counter Support  - 1 x daily - 7 x weekly - 2 sets - 10 reps - Heel Raises with Counter Support  - 1 x daily - 7 x weekly - 2 sets - 10 reps - Standing March with Counter Support  - 1 x daily - 7 x weekly - 2 sets - 10 reps - Standing Knee Flexion with Unilateral Counter Support  - 1 x daily - 7 x weekly - 2 sets - 10 reps - Supine Hip Internal and External Rotation  - 1 x daily - 7 x weekly - 1 sets - 10 reps - Supine Hip Abduction  - 1 x daily - 7 x weekly - 2 sets - 10 reps  ASSESSMENT:  CLINICAL IMPRESSION: Patient had some increased calf tightness today.  We check him for DVT but no s/s and patient was aware of s/s from a previous Bakers cyst and suspicion that he had DVT.  He will monitor this but he was generally just very tight in the calf so we focused on stretching for this.  He is doing well with the exception of lacking full extension.  He would benefit from continuing skilled PT for post partial knee replacement.    OBJECTIVE IMPAIRMENTS: Abnormal gait, decreased activity tolerance, decreased balance, difficulty walking, decreased ROM, decreased strength, increased muscle spasms, impaired flexibility, and pain.   ACTIVITY LIMITATIONS: carrying, lifting, bending, standing, squatting, stairs, bed mobility, and locomotion level  PARTICIPATION LIMITATIONS: cleaning, community activity, and occupation  PERSONAL FACTORS: Time since onset of injury/illness/exacerbation and 3+ comorbidities: OA, s/p L THA, Graves Disease are also affecting patient's functional outcome.   REHAB POTENTIAL: Good  CLINICAL DECISION MAKING: Evolving/moderate complexity  EVALUATION COMPLEXITY: Moderate   GOALS: Goals reviewed with patient? Yes  SHORT TERM GOALS: Target date: 04/16/2024 Patient will be independent with initial HEP.  Baseline: Goal status: MET 03/28/24  2.  Patient to participate in 6  minute walk test to establish a baseline.  Baseline:  Goal status: INITIAL  3.  Patient will improve right knee A/ROM to 10 to 95 degrees to allow for improved gait pattern. Baseline:  Goal status: INITIAL   LONG TERM GOALS: Target date: 05/18/2024  Patient will be independent with advanced HEP to promote continued exercises post discharge.  Baseline:  Goal status: INITIAL  2.  Patient will increase Lower Extremity Functional Scale to at least 50% to demonstrate increased independence with functional activities.  Baseline: 7.5% Goal status: INITIAL  3.  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  Baseline:  Goal status: INITIAL  4.  Patient will report ability to start a walking/jogging exercise program without increased pain.  Baseline:  Goal status: INITIAL  5.  Patient will increase his strength to [facility] to allow him to navigate stairs with reciprocal pattern.  Baseline:  Goal status: INITIAL  6.  Patient will increase knee  A/ROM to 5 to 115 degrees to allow him to demonstrate skills for basketball practices. Baseline:  Goal status: INITIAL   PLAN:  PT FREQUENCY: 2-3x/week  PT DURATION: 8 weeks  PLANNED INTERVENTIONS: 97164- PT Re-evaluation, 97750- Physical Performance Testing, 97110-Therapeutic exercises, 97530- Therapeutic activity, [id number]- Neuromuscular re-education, 97535- Self Care, [id number]- Manual therapy, [id number]- Gait training, [phone]- Canalith repositioning, [id number]- Aquatic Therapy, [phone]- Electrical stimulation (unattended), [phone]- Electrical stimulation (manual), [id number]- Vasopneumatic device, [id number]- Ultrasound, [id number]- Ionotophoresis 4mg /ml Dexamethasone , [id number] (1-2 muscles), [id number] (3+ muscles)- Dry Needling, Patient/Family education, Balance training, Stair training, Taping, Joint mobilization, Joint manipulation, Scar mobilization, Vestibular training, DME instructions, Cryotherapy, and Moist heat  PLAN FOR NEXT SESSION: Nustep, continue  quad rehab, ROM and functional training.     Delon B. Rowynn Mcweeney, PT 03/30/24 8:49 AM [facility] [address] [place], [place] [address] Phone # [phone] Fax [phone]

## 2024-04-03 ENCOUNTER — Ambulatory Visit: Admitting: Physical Therapy

## 2024-04-03 ENCOUNTER — Encounter: Payer: Self-pay | Admitting: Physical Therapy

## 2024-04-03 DIAGNOSIS — R262 Difficulty in walking, not elsewhere classified: Secondary | ICD-10-CM | POA: Diagnosis not present

## 2024-04-03 DIAGNOSIS — M6281 Muscle weakness (generalized): Secondary | ICD-10-CM

## 2024-04-03 DIAGNOSIS — R600 Localized edema: Secondary | ICD-10-CM

## 2024-04-03 DIAGNOSIS — R2689 Other abnormalities of gait and mobility: Secondary | ICD-10-CM | POA: Diagnosis not present

## 2024-04-03 DIAGNOSIS — R293 Abnormal posture: Secondary | ICD-10-CM | POA: Diagnosis not present

## 2024-04-03 DIAGNOSIS — M25561 Pain in right knee: Secondary | ICD-10-CM

## 2024-04-03 DIAGNOSIS — M25661 Stiffness of right knee, not elsewhere classified: Secondary | ICD-10-CM

## 2024-04-03 NOTE — Therapy (Signed)
 OUTPATIENT PHYSICAL THERAPY LOWER EXTREMITY TREATMENT NOTE   Patient Name: Steven Gibson MRN: [id number] DOB:[birth year], 53 y.o., male Today's Date: 04/03/2024  END OF SESSION:  PT End of Session - 04/03/24 1057     Visit Number 4    Date for Recertification  05/18/24    Authorization Type BCBS    Authorization Time Period [employer] CARIT[employer] AUTH REQ AT 27 VISITS $4 COPAY    PT Start Time 1016    PT Stop Time 1055    PT Time Calculation (min) 39 min    Activity Tolerance Patient tolerated treatment well    Behavior During Therapy WFL for tasks assessed/performed           Past Medical History:  Diagnosis Date   ADHD (attention deficit hyperactivity disorder)    Anxiety    Arthritis    Arthrofibrosis of knee joint, left    post op TKA    History of Graves' disease 2008   s/p  RAI  same year , on medication for a year then stablized no meds since  (per pt followed by pcp)   Hyperlipidemia    Hypertension    followed by pcp   (in care everywhere pt had stress echo, 03-26-2016, negative for ishemia and echo normal)   Hyperthyroidism    grave's disease   Pre-diabetes    Right ventricular dysfunction    per [facility] 02-20-24   Thyroid  disease    Wears glasses    Past Surgical History:  Procedure Laterality Date   ANTERIOR CRUCIATE LIGAMENT REPAIR Left 1990S   HERNIA REPAIR     JOINT REPLACEMENT  05/06/2020   KNEE ARTHROSCOPY W/ MEDIAL COLLATERAL LIGAMENT (MCL) REPAIR Left 1990s   KNEE ARTHROSCOPY W/ MENISCAL REPAIR Left 1990s   KNEE CLOSED REDUCTION Left 10/21/2020   Procedure: CLOSED MANIPULATION LEFT KNEE;  Surgeon: Josefina Chew, MD;  Location: [facility];  Service: Orthopedics;  Laterality: Left;   LIPOMA EXCISION     LEF SHOULDER AREA   PARTIAL KNEE ARTHROPLASTY Right 03/21/2024   Procedure: ARTHROPLASTY, KNEE, UNICOMPARTMENTAL;  Surgeon: Edna Toribio LABOR, MD;  Location: [facility];  Service: Orthopedics;  Laterality: Right;    RIGHT HEART CATH N/A 02/20/2024   Procedure: RIGHT HEART CATH;  Surgeon: Mady Bruckner, MD;  Location: [facility];  Service: Cardiovascular;  Laterality: N/A;   TOTAL KNEE ARTHROPLASTY Left 05/06/2020   Procedure: TOTAL KNEE ARTHROPLASTY;  Surgeon: Josefina Chew, MD;  Location: [facility];  Service: Orthopedics;  Laterality: Left;   UMBILICAL HERNIA REPAIR  07-17-2015  @[email or web address]    AND RIGHT ABDOMINAL WALL EXCISION LIPOMA   Patient Active Problem List   Diagnosis Date Noted   Abnormal EKG 01/30/2024   Need for influenza vaccination 01/30/2024   Pre-op exam 01/30/2024   Class 1 obesity due to excess calories with serious comorbidity and body mass index (BMI) of 32.0 to 32.9 in adult 08/30/2023   Seasonal allergies 08/30/2023   Acute cough 08/30/2023   Rash and nonspecific skin eruption 04/13/2023   Encounter for annual health examination 04/07/2023   COVID-19 vaccination declined 04/07/2023   Influenza vaccination declined 04/07/2023   Herpes zoster vaccination declined 04/07/2023   Encounter for prostate cancer screening 04/07/2023   Impacted cerumen of right ear 04/07/2023   Arthritis 09/01/2022   Prediabetes 05/03/2022   Class 1 obesity due to excess calories with serious comorbidity and body mass index (BMI) of 31.0 to 31.9 in adult 05/03/2022   Complication  associated with orthopedic device 02/10/2022   Mood disorder 08/18/2021   Osteoarthritis of left knee 06/04/2021   S/P total knee arthroplasty, left 05/06/2020   Eczema 04/17/2019   Mixed hyperlipidemia 08/02/2018   Numbness and tingling in both hands 08/02/2018   Essential hypertension 07/24/2018   H/O Graves' disease 07/24/2018   Attention deficit disorder 07/24/2018   Dyslipidemia 03/26/2016   Family history of premature CAD 03/26/2016   Tobacco abuse 08/13/2014   GERD without esophagitis 08/13/2014   Lumbar herniated disc 08/13/2014    PCP: Georgina Speaks, FNP  REFERRING PROVIDER: Edna Toribio LABOR,  MD  REFERRING DIAG: S/P right medial unicompartmental knee arthroplasty on 03/21/24  THERAPY DIAG:  Right knee pain, unspecified chronicity  Stiffness of right knee, not elsewhere classified  Muscle weakness (generalized)  Other abnormalities of gait and mobility  Localized edema  Rationale for Evaluation and Treatment: Rehabilitation  ONSET DATE: S/P right medial unicompartmental knee arthroplasty on 03/21/24  SUBJECTIVE:   SUBJECTIVE STATEMENT: Patient reports he is doing good today. No pain. He was a little sore after last session. No AD today  PERTINENT HISTORY: S/P right medial unicompartmental knee arthroplasty on 03/21/24 HTN, Graves Disease, OA, Left Knee TKA 05/06/2020   PAIN:  03/28/24 Are you having pain? Yes: NPRS scale: 2/10 Pain location: right medial knee Pain description: sore Aggravating factors: sudden steps when walking Relieving factors: cold packs  PRECAUTIONS: None  RED FLAGS: None   WEIGHT BEARING RESTRICTIONS: No  FALLS:  Has patient fallen in last 6 months? 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  NEXT MD VISIT: Dr Edna on 04/05/2024  OBJECTIVE:  Note: Objective measures were completed at Evaluation unless otherwise noted.  DIAGNOSTIC FINDINGS:   Right knee radiograph on 03/21/2024: IMPRESSION: 1. Medial hemiarthroplasty in expected alignment. No immediate complications.  PATIENT SURVEYS:  LEFS  Extreme difficulty/unable (0), Quite a bit of difficulty (1), Moderate difficulty (2), Little difficulty (3), No difficulty (4) Survey date:  03/26/24  Any of your  usual work, housework or school activities 0  2. Usual hobbies, recreational or sporting activities 0  3. Getting into/out of the bath 0  4. Walking between rooms 2  5. Putting on socks/shoes 1  6. Squatting  0  7. Lifting an object, like a bag of groceries from the floor 0  8. Performing light activities around your home 1  9. Performing heavy activities around your home 0  10. Getting into/out of a car 0  11. Walking 2 blocks 0  12. Walking 1 mile 0  13. Going up/down 10 stairs (1 flight) 0  14. Standing for 1 hour 0  15.  sitting for 1 hour 1  16. Running on even ground 0  17. Running on uneven ground 0  18. Making sharp turns while running fast 0  19. Hopping  0  20. Rolling over in bed 1  Score total:  6/80 = 7.5%     COGNITION: Overall cognitive status: Within functional limits for tasks assessed     SENSATION: Patient states that the numbness has gone away, but he is still  having some itching  EDEMA:  Circumferential: Right- 44.5 cm, Left- 43 cm  MUSCLE LENGTH: Hamstrings: tightness bilat   PALPATION: Minimal tender to palpation around his knee  LOWER EXTREMITY ROM:  Active ROM Right eval Left eval  Knee flexion 90 107  Knee extension -20 0   (Blank rows = not tested)  LOWER EXTREMITY MMT:  Eval: Left LE strength:  5-/5 Right hip strength is 4/5 Right knee strength of 3- to 3/5   FUNCTIONAL TESTS:  5 times sit to stand: 26.23 sec with poor eccentric muscle control with stand to sit Timed up and go (TUG): 17.77 sec without AD, 12.72 sec with RW  GAIT: Distance walked: >50 ft Assistive device utilized: Environmental Consultant - 2 wheeled Level of assistance: Modified independence Comments: Antalgic                                                                                                                                 TREATMENT DATE:  04/03/2024: Recumbent Bike Level 1 x 5 min - PT present to discuss status Hurdled (forwards /sideways) x 3 each  direction No UE support Stair negotiation x 2 laps (reciprocal pattern with UE support) Step ups at stair with unilateral 10# KB hold 2 x 10 Rt (forwards & lateral) Seated LAQ 2 x 10 with 6 lbs bilateral  Seated march x 20 with 5 lbs Standing HS curl with 6 lbs Leg Press (80# bilateral ) 2 x 10 Walk around both treatment gyms focusing on weight acceptance and equal WB Hip matrix (abduction & extension ) 45# bilateral  Single leg calf stretch x 30 sec bilateral      03/30/2024: Nustep x 5 min level 7 min Seated LAQ 2 x 10 with 5 lbs Seated march x 20 with 5 lbs Standing HS curl with 5 lbs Sit to stand 2 x 5 without UE support Step up and hold 2 x 10 fwd and lateral  Supine quad sets x 20 Palpated calf as patient was c/o more obvious tightness: He did not have any s/s of DVT but he is aware of these s/s and will monitor for any indication of this.  This appears to be just tightness.  He had 3 Bakers cysts before his partial replacement with all 3 rupturing spontaneously.  Popliteal area appears to be quite scarred and tight on palpation.   Rocker board x 2 min  Gastroc stretch on rocker board (both) 10 x 10 sec Soleus stretch 10 x 10 sec right only Patient declined ice  03/28/2024: Nustep x 5 min level 5 Supine quad sets x 20 Supine heel slides x 20 Supine hip abduction x 20 Supine TKE over large green noodle Supine SAQ over larger blue bolster Supine SLR  2 x 10 Sit to stand 2 x 5 without UE support Seated LAQ x 20 Gait training with SPC, emphasis on proper step length and heel strike to toe off :  educated on gradual transition to cane  Game ready x 15 min at medium compression 3 snowflakes  03/26/2024: Reviewed role of PT and  Reviewed HEP  Nustep level 2 x 5 min with PT present to discuss status   PATIENT EDUCATION:  Education details: Issued HEP Person educated: Patient Education method: Explanation, Demonstration, and Handouts Education comprehension: verbalized  understanding  HOME EXERCISE PROGRAM: Access Code: [email or web address] URL: [email or web address] Date: 03/28/2024 Prepared by: Delon Haddock  Exercises - Active Straight Leg Raise with Quad Set  - 1 x daily - 7 x weekly - 2 sets - 10 reps - Supine Heel Slide  - 1 x daily - 7 x weekly - 2 sets - 10 reps - Sit to Stand  - 1 x daily - 7 x weekly - 2 sets - 5 reps - Seated Long Arc Quad  - 1 x daily - 7 x weekly - 2 sets - 10 reps - Seated Heel Slide  - 1 x daily - 7 x weekly - 2 sets - 10 reps - Seated Quad Set  - 1 x daily - 7 x weekly - 2 sets - 10 reps - Standing Hip Abduction with Counter Support  - 1 x daily - 7 x weekly - 2 sets - 10 reps - Heel Raises with Counter Support  - 1 x daily - 7 x weekly - 2 sets - 10 reps - Standing March with Counter Support  - 1 x daily - 7 x weekly - 2 sets - 10 reps - Standing Knee Flexion with Unilateral Counter Support  - 1 x daily - 7 x weekly - 2 sets - 10 reps - Supine Hip Internal and External Rotation  - 1 x daily - 7 x weekly - 1 sets - 10 reps - Supine Hip Abduction  - 1 x daily - 7 x weekly - 2 sets - 10 reps  ASSESSMENT:  CLINICAL IMPRESSION: Patient presents with no pain today. He is walking without his AD. Noted decreased WB on right and slight antalgic gait when he walked into clinic but after exercises noted improved gait mechanics. Patient tolerated increased weight with all exercises with no difficulty. He verbalized pain descending stairs so incorporated that today. Overall, patient is progressing well post surgery and compliant with HEP.  OBJECTIVE IMPAIRMENTS: Abnormal gait, decreased activity tolerance, decreased balance, difficulty walking, decreased ROM, decreased strength, increased muscle spasms, impaired flexibility, and pain.   ACTIVITY LIMITATIONS: carrying, lifting, bending, standing, squatting, stairs, bed mobility, and locomotion level  PARTICIPATION LIMITATIONS: cleaning, community activity, and  occupation  PERSONAL FACTORS: Time since onset of injury/illness/exacerbation and 3+ comorbidities: OA, s/p L THA, Graves Disease are also affecting patient's functional outcome.   REHAB POTENTIAL: Good  CLINICAL DECISION MAKING: Evolving/moderate complexity  EVALUATION COMPLEXITY: Moderate   GOALS: Goals reviewed with patient? Yes  SHORT TERM GOALS: Target date: 04/16/2024 Patient will be independent with initial HEP.  Baseline: Goal status: MET 03/28/24  2.  Patient to participate in 6 minute walk test to establish a baseline.  Baseline:  Goal status: INITIAL  3.  Patient will improve right knee A/ROM to 10 to 95 degrees to allow for improved gait pattern. Baseline:  Goal status: INITIAL   LONG TERM GOALS: Target date: 05/18/2024  Patient will be independent with advanced HEP to promote continued exercises post discharge.  Baseline:  Goal status: INITIAL  2.  Patient will increase Lower Extremity Functional Scale to at least 50% to demonstrate increased independence  with functional activities.  Baseline: 7.5% Goal status: INITIAL  3.  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  Baseline:  Goal status: INITIAL  4.  Patient will report ability to start a walking/jogging exercise program without increased pain.  Baseline:  Goal status: INITIAL  5.  Patient will increase his strength to [facility] to allow him to navigate stairs with reciprocal pattern.  Baseline:  Goal status: INITIAL  6.  Patient will increase knee A/ROM to 5 to 115 degrees to allow him to demonstrate skills for basketball practices. Baseline:  Goal status: INITIAL   PLAN:  PT FREQUENCY: 2-3x/week  PT DURATION: 8 weeks  PLANNED INTERVENTIONS: 97164- PT Re-evaluation, 97750- Physical Performance Testing, 97110-Therapeutic exercises, 97530- Therapeutic activity, [id number]- Neuromuscular re-education, 97535- Self Care, [id number]- Manual therapy, [id number]- Gait training, [phone]-  Canalith repositioning, [id number]- Aquatic Therapy, [phone]- Electrical stimulation (unattended), [id number]- Electrical stimulation (manual), [id number]- Vasopneumatic device, [id number]- Ultrasound, [id number]- Ionotophoresis 4mg /ml Dexamethasone , 20560 (1-2 muscles), 20561 (3+ muscles)- Dry Needling, Patient/Family education, Balance training, Stair training, Taping, Joint mobilization, Joint manipulation, Scar mobilization, Vestibular training, DME instructions, Cryotherapy, and Moist heat  PLAN FOR NEXT SESSION: see how he did after hip matrix & leg press; Nustep, continue quad rehab, ROM and functional training.     Kristeen Sar, PT, DPT 04/03/24 10:58 AM [facility] [address], [address] [place], [place] [address] Phone # [phone] Fax [phone]

## 2024-04-04 ENCOUNTER — Ambulatory Visit

## 2024-04-04 DIAGNOSIS — M25561 Pain in right knee: Secondary | ICD-10-CM

## 2024-04-04 DIAGNOSIS — M25661 Stiffness of right knee, not elsewhere classified: Secondary | ICD-10-CM

## 2024-04-04 DIAGNOSIS — R252 Cramp and spasm: Secondary | ICD-10-CM

## 2024-04-04 DIAGNOSIS — R293 Abnormal posture: Secondary | ICD-10-CM

## 2024-04-04 DIAGNOSIS — R600 Localized edema: Secondary | ICD-10-CM

## 2024-04-04 DIAGNOSIS — R262 Difficulty in walking, not elsewhere classified: Secondary | ICD-10-CM | POA: Diagnosis not present

## 2024-04-04 DIAGNOSIS — M6281 Muscle weakness (generalized): Secondary | ICD-10-CM | POA: Diagnosis not present

## 2024-04-04 DIAGNOSIS — R2689 Other abnormalities of gait and mobility: Secondary | ICD-10-CM | POA: Diagnosis not present

## 2024-04-04 NOTE — Therapy (Signed)
 OUTPATIENT PHYSICAL THERAPY LOWER EXTREMITY TREATMENT NOTE   Patient Name: Steven Gibson MRN: [id number] DOB:02/09/1971, 53 y.o., male Today's Date: 04/04/2024  END OF SESSION:  PT End of Session - 04/04/24 0935     Visit Number 5    Date for Recertification  05/18/24    Authorization Type BCBS    Authorization Time Period [employer] CARIT[employer] AUTH REQ AT 27 VISITS $4 COPAY    PT Start Time 0932    PT Stop Time 1007    PT Time Calculation (min) 35 min    Activity Tolerance Patient tolerated treatment well    Behavior During Therapy WFL for tasks assessed/performed           Past Medical History:  Diagnosis Date   ADHD (attention deficit hyperactivity disorder)    Anxiety    Arthritis    Arthrofibrosis of knee joint, left    post op TKA    History of Graves' disease 2008   s/p  RAI  same year , on medication for a year then stablized no meds since  (per pt followed by pcp)   Hyperlipidemia    Hypertension    followed by pcp   (in care everywhere pt had stress echo, 03-26-2016, negative for ishemia and echo normal)   Hyperthyroidism    grave's disease   Pre-diabetes    Right ventricular dysfunction    per [facility] 02-20-24   Thyroid  disease    Wears glasses    Past Surgical History:  Procedure Laterality Date   ANTERIOR CRUCIATE LIGAMENT REPAIR Left 1990S   HERNIA REPAIR     JOINT REPLACEMENT  05/06/2020   KNEE ARTHROSCOPY W/ MEDIAL COLLATERAL LIGAMENT (MCL) REPAIR Left 1990s   KNEE ARTHROSCOPY W/ MENISCAL REPAIR Left 1990s   KNEE CLOSED REDUCTION Left 10/21/2020   Procedure: CLOSED MANIPULATION LEFT KNEE;  Surgeon: Josefina Chew, MD;  Location: [facility];  Service: Orthopedics;  Laterality: Left;   LIPOMA EXCISION     LEF SHOULDER AREA   PARTIAL KNEE ARTHROPLASTY Right 03/21/2024   Procedure: ARTHROPLASTY, KNEE, UNICOMPARTMENTAL;  Surgeon: Edna Toribio LABOR, MD;  Location: [facility];  Service: Orthopedics;  Laterality: Right;    RIGHT HEART CATH N/A 02/20/2024   Procedure: RIGHT HEART CATH;  Surgeon: Mady Bruckner, MD;  Location: [facility];  Service: Cardiovascular;  Laterality: N/A;   TOTAL KNEE ARTHROPLASTY Left 05/06/2020   Procedure: TOTAL KNEE ARTHROPLASTY;  Surgeon: Josefina Chew, MD;  Location: [facility];  Service: Orthopedics;  Laterality: Left;   UMBILICAL HERNIA REPAIR  07-17-2015  @[email or web address]    AND RIGHT ABDOMINAL WALL EXCISION LIPOMA   Patient Active Problem List   Diagnosis Date Noted   Abnormal EKG 01/30/2024   Need for influenza vaccination 01/30/2024   Pre-op exam 01/30/2024   Class 1 obesity due to excess calories with serious comorbidity and body mass index (BMI) of 32.0 to 32.9 in adult 08/30/2023   Seasonal allergies 08/30/2023   Acute cough 08/30/2023   Rash and nonspecific skin eruption 04/13/2023   Encounter for annual health examination 04/07/2023   COVID-19 vaccination declined 04/07/2023   Influenza vaccination declined 04/07/2023   Herpes zoster vaccination declined 04/07/2023   Encounter for prostate cancer screening 04/07/2023   Impacted cerumen of right ear 04/07/2023   Arthritis 09/01/2022   Prediabetes 05/03/2022   Class 1 obesity due to excess calories with serious comorbidity and body mass index (BMI) of 31.0 to 31.9 in adult 05/03/2022   Complication  associated with orthopedic device 02/10/2022   Mood disorder 08/18/2021   Osteoarthritis of left knee 06/04/2021   S/P total knee arthroplasty, left 05/06/2020   Eczema 04/17/2019   Mixed hyperlipidemia 08/02/2018   Numbness and tingling in both hands 08/02/2018   Essential hypertension 07/24/2018   H/O Graves' disease 07/24/2018   Attention deficit disorder 07/24/2018   Dyslipidemia 03/26/2016   Family history of premature CAD 03/26/2016   Tobacco abuse 08/13/2014   GERD without esophagitis 08/13/2014   Lumbar herniated disc 08/13/2014    PCP: Georgina Speaks, FNP  REFERRING PROVIDER: Edna Toribio LABOR,  MD  REFERRING DIAG: S/P right medial unicompartmental knee arthroplasty on 03/21/24  THERAPY DIAG:  Right knee pain, unspecified chronicity  Stiffness of right knee, not elsewhere classified  Muscle weakness (generalized)  Localized edema  Difficulty in walking, not elsewhere classified  Abnormal posture  Cramp and spasm  Rationale for Evaluation and Treatment: Rehabilitation  ONSET DATE: S/P right medial unicompartmental knee arthroplasty on 03/21/24  SUBJECTIVE:   SUBJECTIVE STATEMENT: Patient reports he is sore from being out a lot yesterday.  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    PERTINENT HISTORY: S/P right medial unicompartmental knee arthroplasty on 03/21/24 HTN, Graves Disease, OA, Left Knee TKA 05/06/2020   PAIN:  04/04/24 Are you having pain? Yes: NPRS scale: 3/10 Pain location: right medial knee Pain description: sore Aggravating factors: sudden steps when walking Relieving factors: cold packs  PRECAUTIONS: None  RED FLAGS: None   WEIGHT BEARING RESTRICTIONS: No  FALLS:  Has patient fallen in last 6 months? 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  NEXT MD VISIT: Dr Edna on 04/05/2024  OBJECTIVE:  Note: Objective measures were completed at Evaluation unless otherwise noted.  DIAGNOSTIC FINDINGS:   Right knee radiograph on 03/21/2024: IMPRESSION: 1. Medial hemiarthroplasty in expected alignment. No immediate complications.  PATIENT SURVEYS:  LEFS  Extreme difficulty/unable (0), Quite a bit of difficulty (1), Moderate difficulty (2),  Little difficulty (3), No difficulty (4) Survey date:  03/26/24  Any of your usual work, housework or school activities 0  2. Usual hobbies, recreational or sporting activities 0  3. Getting into/out of the bath 0  4. Walking between rooms 2  5. Putting on socks/shoes 1  6. Squatting  0  7. Lifting an object, like a bag of groceries from the floor 0  8. Performing light activities around your home 1  9. Performing heavy activities around your home 0  10. Getting into/out of a car 0  11. Walking 2 blocks 0  12. Walking 1 mile 0  13. Going up/down 10 stairs (1 flight) 0  14. Standing for 1 hour 0  15.  sitting for 1 hour 1  16. Running on even ground 0  17. Running on uneven ground 0  18. Making sharp turns while running fast 0  19. Hopping  0  20. Rolling over in bed 1  Score total:  6/80 = 7.5%     COGNITION: Overall cognitive status: Within functional limits for tasks  assessed     SENSATION: Patient states that the numbness has gone away, but he is still having some itching  EDEMA:  Circumferential: Right- 44.5 cm, Left- 43 cm  MUSCLE LENGTH: Hamstrings: tightness bilat   PALPATION: Minimal tender to palpation around his knee  LOWER EXTREMITY ROM:  Active ROM Right eval Left eval  Knee flexion 90 107  Knee extension -20 0   (Blank rows = not tested)  LOWER EXTREMITY MMT:  Eval: Left LE strength:  5-/5 Right hip strength is 4/5 Right knee strength of 3- to 3/5   FUNCTIONAL TESTS:  5 times sit to stand: 26.23 sec with poor eccentric muscle control with stand to sit Timed up and go (TUG): 17.77 sec without AD, 12.72 sec with RW  GAIT: Distance walked: >50 ft Assistive device utilized: Environmental Consultant - 2 wheeled Level of assistance: Modified independence Comments: Antalgic                                                                                                                                 TREATMENT DATE:  04/04/2024: Nustep: LE's only x 5 min  level 5 Sit to stand x 10  Squats to table x 10 Dead lift with 2 x 10lbs 2 sets of 10 Reviewed importance of extension propping: demonstrated proper position Rocker board x 2 min Gastroc stretch on rocker board 10 x 10 sec Soleus stretch 5 x on right x 10 sec Hip matrix (abduction & extension ) 45# right Seated LAQ with 5# 2 x 10 Leg Press (80# bilateral ) 2 x 10 Seated clam with black band x 20 Patient declined ice  04/03/2024: Recumbent Bike Level 1 x 5 min - PT present to discuss status Hurdled (forwards /sideways) x 3 each direction No UE support Stair negotiation x 2 laps (reciprocal pattern with UE support) Step ups at stair with unilateral 10# KB hold 2 x 10 Rt (forwards & lateral) Seated LAQ 2 x 10 with 6 lbs bilateral  Seated march x 20 with 5 lbs Standing HS curl with 6 lbs Leg Press (80# bilateral ) 2 x 10 Walk around both treatment gyms focusing on weight acceptance and equal WB Hip matrix (abduction & extension ) 45# bilateral  Single leg calf stretch x 30 sec bilateral   03/30/2024: Nustep x 5 min level 7 min Seated LAQ 2 x 10 with 5 lbs Seated march x 20 with 5 lbs Standing HS curl with 5 lbs Sit to stand 2 x 5 without UE support Step up and hold 2 x 10 fwd and lateral  Supine quad sets x 20 Palpated calf as patient was c/o more obvious tightness: He did not have any s/s of DVT but he is aware of these s/s and will monitor for any indication of this.  This appears to be just tightness.  He had 3 Bakers cysts before his partial replacement with all 3 rupturing spontaneously.  Popliteal area appears to be quite scarred and tight on palpation.   Rocker board x 2 min  Gastroc stretch on rocker board (both) 10 x 10 sec Soleus stretch 10 x 10 sec right only Patient declined ice   PATIENT EDUCATION:  Education details: Issued HEP Person educated: Patient Education method: Explanation, Demonstration, and Handouts Education comprehension: verbalized  understanding  HOME EXERCISE PROGRAM: Access Code: [email or web address] URL: [email or web address] Date: 03/28/2024 Prepared by: Delon Haddock  Exercises - Active Straight Leg Raise with Quad Set  - 1 x daily - 7 x weekly - 2 sets - 10 reps - Supine Heel Slide  - 1 x daily - 7 x weekly - 2 sets - 10 reps - Sit to Stand  - 1 x daily - 7 x weekly - 2 sets - 5 reps - Seated Long Arc Quad  - 1 x daily - 7 x weekly - 2 sets - 10 reps - Seated Heel Slide  - 1 x daily - 7 x weekly - 2 sets - 10 reps - Seated Quad Set  - 1 x daily - 7 x weekly - 2 sets - 10 reps - Standing Hip Abduction with Counter Support  - 1 x daily - 7 x weekly - 2 sets - 10 reps - Heel Raises with Counter Support  - 1 x daily - 7 x weekly - 2 sets - 10 reps - Standing March with Counter Support  - 1 x daily - 7 x weekly - 2 sets - 10 reps - Standing Knee Flexion with Unilateral Counter Support  - 1 x daily - 7 x weekly - 2 sets - 10 reps - Supine Hip Internal and External Rotation  - 1 x daily - 7 x weekly - 1 sets - 10 reps - Supine Hip Abduction  - 1 x daily - 7 x weekly - 2 sets - 10 reps  ASSESSMENT:  CLINICAL IMPRESSION: Patient presents with some soreness today. Still not using AD.  He is still lacking full extension.  We reviewed importance of consistent propping for this and edema control.  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  His gait is improving but he still lacks full extension of heel strike and lack of proper weight shift.  This is likely due to him discontinuing the AD so abruptly.  We will monitor this and give verbal cues to correct.  This should improve.   Overall, patient is progressing well post surgery and compliant with HEP.  He would benefit from continuing skilled PT to meet goals below.    OBJECTIVE IMPAIRMENTS: Abnormal gait, decreased activity tolerance, decreased balance, difficulty walking, decreased ROM, decreased strength, increased muscle spasms,  impaired flexibility, and pain.   ACTIVITY LIMITATIONS: carrying, lifting, bending, standing, squatting, stairs, bed mobility, and locomotion level  PARTICIPATION LIMITATIONS: cleaning, community activity, and occupation  PERSONAL FACTORS: Time since onset of injury/illness/exacerbation and 3+ comorbidities: OA, s/p L THA, Graves Disease are also affecting patient's functional outcome.   REHAB POTENTIAL: Good  CLINICAL DECISION MAKING: Evolving/moderate complexity  EVALUATION COMPLEXITY: Moderate   GOALS: Goals reviewed with patient? Yes  SHORT TERM GOALS: Target date: 04/16/2024 Patient will be independent with initial HEP.  Baseline: Goal status: MET 03/28/24  2.  Patient to participate in 6 minute walk test to establish a baseline.  Baseline:  Goal status: INITIAL  3.  Patient will improve right knee A/ROM to 10 to 95 degrees to allow for  improved gait pattern. Baseline:  Goal status: MET 04/04/24   LONG TERM GOALS: Target date: 05/18/2024  Patient will be independent with advanced HEP to promote continued exercises post discharge.  Baseline:  Goal status: INITIAL  2.  Patient will increase Lower Extremity Functional Scale to at least 50% to demonstrate increased independence with functional activities.  Baseline: 7.5% Goal status: INITIAL  3.  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  Baseline:  Goal status: INITIAL  4.  Patient will report ability to start a walking/jogging exercise program without increased pain.  Baseline:  Goal status: INITIAL  5.  Patient will increase his strength to [facility] to allow him to navigate stairs with reciprocal pattern.  Baseline:  Goal status: INITIAL  6.  Patient will increase knee A/ROM to 5 to 115 degrees to allow him to demonstrate skills for basketball practices. Baseline:  Goal status: INITIAL   PLAN:  PT FREQUENCY: 2-3x/week  PT DURATION: 8 weeks  PLANNED INTERVENTIONS: [id number]- PT  Re-evaluation, 97750- Physical Performance Testing, 97110-Therapeutic exercises, 97530- Therapeutic activity, [id number]- Neuromuscular re-education, 97535- Self Care, [id number]- Manual therapy, [id number]- Gait training, [phone]- Canalith repositioning, [id number]- Aquatic Therapy, [phone]- Electrical stimulation (unattended), [phone]- Electrical stimulation (manual), [id number]- Vasopneumatic device, [id number]- Ultrasound, [id number]- Ionotophoresis 4mg /ml Dexamethasone , [id number] (1-2 muscles), [id number] (3+ muscles)- Dry Needling, Patient/Family education, Balance training, Stair training, Taping, Joint mobilization, Joint manipulation, Scar mobilization, Vestibular training, DME instructions, Cryotherapy, and Moist heat  PLAN FOR NEXT SESSION: Nustep, continue quad rehab, ROM and functional training.     Delon B. Veida Spira, PT 04/04/24 10:13 AM [facility] [address], [address] [place], [place] [address] Phone # [phone] Fax [phone]

## 2024-04-05 DIAGNOSIS — Z96651 Presence of right artificial knee joint: Secondary | ICD-10-CM | POA: Diagnosis not present

## 2024-04-06 ENCOUNTER — Ambulatory Visit: Admitting: Rehabilitative and Restorative Service Providers"

## 2024-04-06 ENCOUNTER — Ambulatory Visit: Admitting: Rehabilitation

## 2024-04-09 ENCOUNTER — Ambulatory Visit: Payer: Self-pay | Admitting: Nurse Practitioner

## 2024-04-09 ENCOUNTER — Encounter: Payer: Self-pay | Admitting: Nurse Practitioner

## 2024-04-09 VITALS — BP 140/92 | HR 87 | Temp 98.3°F | Ht 74.0 in | Wt 250.0 lb

## 2024-04-09 DIAGNOSIS — Z125 Encounter for screening for malignant neoplasm of prostate: Secondary | ICD-10-CM

## 2024-04-09 DIAGNOSIS — Z720 Tobacco use: Secondary | ICD-10-CM

## 2024-04-09 DIAGNOSIS — Z0000 Encounter for general adult medical examination without abnormal findings: Secondary | ICD-10-CM | POA: Diagnosis not present

## 2024-04-09 DIAGNOSIS — I10 Essential (primary) hypertension: Secondary | ICD-10-CM | POA: Diagnosis not present

## 2024-04-09 DIAGNOSIS — R7303 Prediabetes: Secondary | ICD-10-CM

## 2024-04-09 DIAGNOSIS — E782 Mixed hyperlipidemia: Secondary | ICD-10-CM

## 2024-04-09 DIAGNOSIS — E6609 Other obesity due to excess calories: Secondary | ICD-10-CM

## 2024-04-09 DIAGNOSIS — E66811 Obesity, class 1: Secondary | ICD-10-CM

## 2024-04-09 DIAGNOSIS — Z79899 Other long term (current) drug therapy: Secondary | ICD-10-CM

## 2024-04-09 DIAGNOSIS — Z9889 Other specified postprocedural states: Secondary | ICD-10-CM

## 2024-04-09 DIAGNOSIS — Z6832 Body mass index (BMI) 32.0-32.9, adult: Secondary | ICD-10-CM

## 2024-04-09 NOTE — Progress Notes (Signed)
 I,[name], CMA,acting as a neurosurgeon for [employer], FNP.,have documented all relevant documentation on the behalf of [name], FNP,as directed by  [name], FNP while in the presence of [name], FNP.  Subjective:   Patient ID: Steven Gibson , male    DOB: [birth year] , 53 y.o.   MRN: [id number]  Chief Complaint  Patient presents with   Annual Exam    Patient presents today for HM, Patient reports compliance with medication. Patient denies any chest pain, SOB, or headaches. Patient has no concerns today.     HPI  Discussed the use of AI scribe software for clinical note transcription with the patient, who gave verbal consent to proceed.  History of Present Illness Steven Gibson is a 53 year old male who presents for an annual physical exam.  He takes his blood pressure medication both in the morning and at night. He reports that he smoked a cigarette before the visit and believes this can affect his blood pressure. He has a history of smoking, currently smoking three to four cigarettes per day, and has been smoking for approximately 30 years. He started smoking in his early twenties, influenced by social circles. He has cut back on smoking recently.  He underwent a partial knee replacement and is currently attending physical therapy three times a week. He is out of work until February 26th. He notes weight gain due to reduced physical activity and is mindful of his diet, which is generally healthy, consisting of turkey, chicken, and vegetables.  He mentions a change in his cholesterol medication from pravastatin  to simvastatin. He urinates frequently, about twenty times a day, and drinks about four 16-ounce bottles of water  daily. No urinary hesitancy. No issues with constipation or diarrhea, although he experienced constipation for two days post-surgery, which resolved on its own.  He is married and his wife is supportive of maintaining a healthy  lifestyle. 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  Past Medical History:  Diagnosis Date   ADHD (attention deficit hyperactivity disorder)    Anxiety    Arthritis    Arthrofibrosis of knee joint, left    post op TKA    History of Graves' disease 2008   s/p  RAI  same year , on medication for a year then stablized no meds since  (per pt followed by pcp)   Hyperlipidemia    Hypertension    followed by pcp   (in care everywhere pt had stress echo, 03-26-2016, negative for ishemia and echo normal)   Hyperthyroidism    grave's disease   Pre-diabetes    Right ventricular dysfunction    per [facility] 02-20-24   Thyroid  disease    Wears glasses      Family History  Problem Relation Age of Onset   Hypertension Mother    Hypertension Father    Hypertension Brother    Cancer Paternal Grandfather    Hypertension Brother      Current Outpatient Medications:    acetaminophen  (TYLENOL ) 500 MG tablet, Take 2 tablets (1,000 mg total) by mouth every 8 (eight) hours as needed., Disp: , Rfl:    amLODipine  (NORVASC ) 10 MG tablet, Take 1 tablet (10 mg total) by mouth daily., Disp: 90 tablet, Rfl: 1   amphetamine -dextroamphetamine  (ADDERALL) 15 MG tablet, Take 1 tablet by mouth 3 (three) times daily., Disp: 90 tablet, Rfl: 0   aspirin  EC 81 MG tablet, Take 1 tablet (81  mg total) by mouth 2 (two) times daily for 28 days. Swallow whole., Disp: , Rfl:    Azelastine  HCl 137 MCG/SPRAY SOLN, Place 2 sprays into the nose daily., Disp: 30 mL, Rfl: 2   escitalopram  (LEXAPRO ) 20 MG tablet, Take 1 tablet (20 mg total) by mouth daily., Disp: 90 tablet, Rfl: 1   hydrALAZINE  (APRESOLINE ) 25 MG tablet, TAKE 1 TABLET TWICE DAILY, AM & NIGHT, Disp: 180 tablet, Rfl: 1   lisinopril -hydrochlorothiazide  (ZESTORETIC ) 20-25 MG tablet, Take 1 tablet by mouth daily., Disp: 90 tablet, Rfl: 1   methimazole  (TAPAZOLE ) 5 MG tablet, Take 1 tablet (5 mg total) by mouth as directed. 1 tablet Monday  through Saturday and none on Sundays, Disp: 78 tablet, Rfl: 3   omeprazole  (PRILOSEC) 40 MG capsule, Take 1 capsule (40 mg total) by mouth daily for 21 days., Disp: 21 capsule, Rfl: 0   polyethylene glycol (MIRALAX ) 17 g packet, Take 17 g by mouth daily., Disp: 14 each, Rfl: 0   Potassium Chloride  (K+ POTASSIUM PO), Take 99 mg by mouth daily. Over the counter, per pt takes for leg cramps, Disp: , Rfl:    rosuvastatin  (CRESTOR ) 20 MG tablet, Take 1 tablet (20 mg total) by mouth daily., Disp: 90 tablet, Rfl: 3   No Known Allergies   Men's preventive visit. Patient Health Questionnaire (PHQ-2) is  Flowsheet Row Office Visit from 01/30/2024 in [name]  PHQ-2 Total Score 0  Patient is on a Regular diet. Exercise PT 3 times a week since having his partial knee replacement. Marital status: Married. Relevant history for alcohol use is:  Social History   Substance and Sexual Activity  Alcohol Use Yes   Comment: occasional  Relevant history for tobacco use is:  Social History   Tobacco Use  Smoking Status Every Day   Current packs/day: 0.50   Average packs/day: 0.5 packs/day for 48.9 years (24.5 ttl pk-yrs)   Types: Cigarettes   Start date: 2002  Smokeless Tobacco Former   Quit date: 10/20/1988  .   Review of Systems  Constitutional: Negative.   HENT: Negative.    Eyes: Negative.   Respiratory: Negative.    Cardiovascular: Negative.   Gastrointestinal: Negative.   Endocrine: Negative.   Genitourinary: Negative.   Musculoskeletal: Negative.   Skin: Negative.   Neurological: Negative.   Hematological: Negative.   Psychiatric/Behavioral: Negative.       Today's Vitals   04/09/24 0942 04/09/24 1008  BP: (!) 140/80 (!) 140/92  Pulse: 87   Temp: 98.3 F (36.8 C)   TempSrc: Oral   Weight: 250 lb (113.4 kg)   Height: 6' 2 (1.88 m)   PainSc: 0-No pain    Body mass index is 32.1 kg/m.  Wt Readings from Last 3 Encounters:  04/09/24 250 lb  (113.4 kg)  03/21/24 237 lb (107.5 kg)  03/12/24 237 lb (107.5 kg)    Objective:  Physical Exam Vitals reviewed.  Constitutional:      General: He is not in acute distress.    Appearance: Normal appearance. He is obese.  HENT:     Head: Normocephalic.     Right Ear: Tympanic membrane, ear canal and external ear normal. There is no impacted cerumen.     Left Ear: Tympanic membrane, ear canal and external ear normal. There is no impacted cerumen.     Nose: Nose normal. No congestion.     Mouth/Throat:     Mouth: Mucous membranes are moist.  Eyes:     Extraocular Movements: Extraocular movements intact.     Conjunctiva/sclera: Conjunctivae normal.     Pupils: Pupils are equal, round, and reactive to light.  Cardiovascular:     Rate and Rhythm: Normal rate and regular rhythm.     Pulses: Normal pulses.     Heart sounds: Normal heart sounds. No murmur heard. Pulmonary:     Effort: Pulmonary effort is normal. No respiratory distress.     Breath sounds: Normal breath sounds. No wheezing.  Abdominal:     General: Abdomen is flat. Bowel sounds are normal. There is no distension.     Palpations: Abdomen is soft.     Tenderness: There is no abdominal tenderness.  Genitourinary:    Comments: Deferred at patient request Musculoskeletal:        General: No swelling or tenderness. Normal range of motion.     Cervical back: Normal range of motion and neck supple. No rigidity.  Skin:    General: Skin is warm and dry.     Capillary Refill: Capillary refill takes less than 2 seconds.     Findings: Rash present.  Neurological:     General: No focal deficit present.     Mental Status: He is alert and oriented to person, place, and time.     Cranial Nerves: No cranial nerve deficit.     Sensory: No sensory deficit.     Motor: No weakness.  Psychiatric:        Mood and Affect: Mood normal.        Behavior: Behavior normal.        Thought Content: Thought content normal.        Judgment:  Judgment normal.      Assessment And Plan:    Encounter for general adult medical examination w/o abnormal findings Assessment & Plan: Routine wellness visit with focus on diet, exercise, and weight management post-surgery. - Continue healthy diet and regular exercise as tolerated post-surgery.   Essential hypertension Assessment & Plan: Blood pressure elevated, possibly due to smoking. Discussed regular monitoring. - Rechecked blood pressure before leaving the clinic. - Recommended checking blood pressure at least three times a week.  Orders: -     CMP14+EGFR  Mixed hyperlipidemia Assessment & Plan: Cholesterol medication changed to simvastatin 20 mg. ASCVD risk is 19%. - Switched pravastatin  to simvastatin 20 mg. - Will repeat lipid panel in six months.   Prediabetes Assessment & Plan: HgbA1c is stable, continue focusing on healthy diet   Orders: -     Hemoglobin A1c  Class 1 obesity due to excess calories with serious comorbidity and body mass index (BMI) of 32.0 to 32.9 in adult Assessment & Plan: Weight gain due to reduced activity post-surgery. Discussed mindful eating and exercise. - Encouraged mindful eating and regular exercise as tolerated.   Other long term (current) drug therapy  Encounter for prostate cancer screening -     PSA  H/O right knee surgery  Tobacco abuse Assessment & Plan: Smokes 3-4 cigarettes per day. Discussed impact on blood pressure and health.  CT lung scan recommended due to smoking history and age. - Ordered CT lung scan. - Instructed to follow up if not contacted by scheduling within two weeks.  Orders: -     CT CHEST WO CONTRAST; Future    Return in about 4 months (around 08/07/2024) for 1 year physical,bpc. Patient was given opportunity to ask questions. Patient verbalized understanding of the plan and was able  to repeat key elements of the plan. All questions were answered to their satisfaction.   [name],  FNP  I, [name], FNP, have reviewed all documentation for this visit. The documentation on 04/16/24 for the exam, diagnosis, procedures, and orders are all accurate and complete.

## 2024-04-09 NOTE — Patient Instructions (Signed)
 Health Maintenance, Male  Adopting a healthy lifestyle and getting preventive care are important in promoting health and wellness. Ask your health care provider about:  The right schedule for you to have regular tests and exams.  Things you can do on your own to prevent diseases and keep yourself healthy.  What should I know about diet, weight, and exercise?  Eat a healthy diet    Eat a diet that includes plenty of vegetables, fruits, low-fat dairy products, and lean protein.  Do not eat a lot of foods that are high in solid fats, added sugars, or sodium.  Maintain a healthy weight  Body mass index (BMI) is a measurement that can be used to identify possible weight problems. It estimates body fat based on height and weight. Your health care provider can help determine your BMI and help you achieve or maintain a healthy weight.  Get regular exercise  Get regular exercise. This is one of the most important things you can do for your health. Most adults should:  Exercise for at least 150 minutes each week. The exercise should increase your heart rate and make you sweat (moderate-intensity exercise).  Do strengthening exercises at least twice a week. This is in addition to the moderate-intensity exercise.  Spend less time sitting. Even light physical activity can be beneficial.  Watch cholesterol and blood lipids  Have your blood tested for lipids and cholesterol at 54 years of age, then have this test every 5 years.  You may need to have your cholesterol levels checked more often if:  Your lipid or cholesterol levels are high.  You are older than 54 years of age.  You are at high risk for heart disease.  What should I know about cancer screening?  Many types of cancers can be detected early and may often be prevented. Depending on your health history and family history, you may need to have cancer screening at various ages. This may include screening for:  Colorectal cancer.  Prostate cancer.  Skin cancer.  Lung  cancer.  What should I know about heart disease, diabetes, and high blood pressure?  Blood pressure and heart disease  High blood pressure causes heart disease and increases the risk of stroke. This is more likely to develop in people who have high blood pressure readings or are overweight.  Talk with your health care provider about your target blood pressure readings.  Have your blood pressure checked:  Every 3-5 years if you are 24-52 years of age.  Every year if you are 3 years old or older.  If you are between the ages of 60 and 72 and are a current or former smoker, ask your health care provider if you should have a one-time screening for abdominal aortic aneurysm (AAA).  Diabetes  Have regular diabetes screenings. This checks your fasting blood sugar level. Have the screening done:  Once every three years after age 66 if you are at a normal weight and have a low risk for diabetes.  More often and at a younger age if you are overweight or have a high risk for diabetes.  What should I know about preventing infection?  Hepatitis B  If you have a higher risk for hepatitis B, you should be screened for this virus. Talk with your health care provider to find out if you are at risk for hepatitis B infection.  Hepatitis C  Blood testing is recommended for:  Everyone born from [birth year] through 1966.  Anyone  with known risk factors for hepatitis C.  Sexually transmitted infections (STIs)  You should be screened each year for STIs, including gonorrhea and chlamydia, if:  You are sexually active and are younger than 53 years of age.  You are older than 54 years of age and your health care provider tells you that you are at risk for this type of infection.  Your sexual activity has changed since you were last screened, and you are at increased risk for chlamydia or gonorrhea. Ask your health care provider if you are at risk.  Ask your health care provider about whether you are at high risk for HIV. Your health care provider  may recommend a prescription medicine to help prevent HIV infection. If you choose to take medicine to prevent HIV, you should first get tested for HIV. You should then be tested every 3 months for as long as you are taking the medicine.  Follow these instructions at home:  Alcohol use  Do not drink alcohol if your health care provider tells you not to drink.  If you drink alcohol:  Limit how much you have to 0-2 drinks a day.  Know how much alcohol is in your drink. In the [place] one drink equals one 12 oz bottle of beer (355 mL), one 5 oz glass of wine (148 mL), or one 1 oz glass of hard liquor (44 mL).  Lifestyle  Do not use any products that contain nicotine or tobacco. These products include cigarettes, chewing tobacco, and vaping devices, such as e-cigarettes. If you need help quitting, ask your health care provider.  Do not use street drugs.  Do not share needles.  Ask your health care provider for help if you need support or information about quitting drugs.  General instructions  Schedule regular health, dental, and eye exams.  Stay current with your vaccines.  Tell your health care provider if:  You often feel depressed.  You have ever been abused or do not feel safe at home.  Summary  Adopting a healthy lifestyle and getting preventive care are important in promoting health and wellness.  Follow your health care provider's instructions about healthy diet, exercising, and getting tested or screened for diseases.  Follow your health care provider's instructions on monitoring your cholesterol and blood pressure.  This information is not intended to replace advice given to you by your health care provider. Make sure you discuss any questions you have with your health care provider.  Document Revised: 09/22/2020 Document Reviewed: 09/22/2020  Elsevier Patient Education  2024 [employer].

## 2024-04-10 LAB — CMP14+EGFR
ALT: 35 IU/L (ref 0–44)
AST: 30 IU/L (ref 0–40)
Albumin: 4.5 g/dL (ref 3.8–4.9)
Alkaline Phosphatase: 154 IU/L — ABNORMAL HIGH (ref 47–123)
BUN/Creatinine Ratio: 15 (ref 9–20)
BUN: 14 mg/dL (ref 6–24)
Bilirubin Total: 0.3 mg/dL (ref 0.0–1.2)
CO2: 24 mmol/L (ref 20–29)
Calcium: 9.8 mg/dL (ref 8.7–10.2)
Chloride: 101 mmol/L (ref 96–106)
Creatinine, Ser: 0.95 mg/dL (ref 0.76–1.27)
Globulin, Total: 2.4 g/dL (ref 1.5–4.5)
Glucose: 112 mg/dL — ABNORMAL HIGH (ref 70–99)
Potassium: 4.3 mmol/L (ref 3.5–5.2)
Sodium: 138 mmol/L (ref 134–144)
Total Protein: 6.9 g/dL (ref 6.0–8.5)
eGFR: 96 mL/min/1.73 (ref >59)

## 2024-04-10 LAB — HEMOGLOBIN A1C
Est. average glucose Bld gHb Est-mCnc: 137 mg/dL
Hgb A1c MFr Bld: 6.4 % — ABNORMAL HIGH (ref 4.8–5.6)

## 2024-04-10 LAB — PSA: Prostate Specific Ag, Serum: 0.9 ng/mL (ref 0.0–4.0)

## 2024-04-11 ENCOUNTER — Ambulatory Visit

## 2024-04-11 DIAGNOSIS — R600 Localized edema: Secondary | ICD-10-CM

## 2024-04-11 DIAGNOSIS — R293 Abnormal posture: Secondary | ICD-10-CM | POA: Diagnosis not present

## 2024-04-11 DIAGNOSIS — M25561 Pain in right knee: Secondary | ICD-10-CM

## 2024-04-11 DIAGNOSIS — M25661 Stiffness of right knee, not elsewhere classified: Secondary | ICD-10-CM

## 2024-04-11 DIAGNOSIS — R2689 Other abnormalities of gait and mobility: Secondary | ICD-10-CM | POA: Diagnosis not present

## 2024-04-11 DIAGNOSIS — M6281 Muscle weakness (generalized): Secondary | ICD-10-CM | POA: Diagnosis not present

## 2024-04-11 DIAGNOSIS — R262 Difficulty in walking, not elsewhere classified: Secondary | ICD-10-CM | POA: Diagnosis not present

## 2024-04-11 DIAGNOSIS — R252 Cramp and spasm: Secondary | ICD-10-CM

## 2024-04-11 NOTE — Therapy (Signed)
 OUTPATIENT PHYSICAL THERAPY LOWER EXTREMITY TREATMENT NOTE   Patient Name: Steven Gibson MRN: [id number] DOB:1970/06/28, 53 y.o., male Today's Date: 04/11/2024  END OF SESSION:  PT End of Session - 04/11/24 0936     Visit Number 6    Date for Recertification  05/18/24    Authorization Type BCBS    Authorization Time Period [employer] CARIT[employer] AUTH REQ AT 27 VISITS $4 COPAY    Authorization - Visit Number 6    Authorization - Number of Visits 27    PT Start Time 0933    PT Stop Time 1005    PT Time Calculation (min) 32 min    Activity Tolerance Patient tolerated treatment well    Behavior During Therapy WFL for tasks assessed/performed           Past Medical History:  Diagnosis Date   ADHD (attention deficit hyperactivity disorder)    Anxiety    Arthritis    Arthrofibrosis of knee joint, left    post op TKA    History of Graves' disease 2008   s/p  RAI  same year , on medication for a year then stablized no meds since  (per pt followed by pcp)   Hyperlipidemia    Hypertension    followed by pcp   (in care everywhere pt had stress echo, 03-26-2016, negative for ishemia and echo normal)   Hyperthyroidism    grave's disease   Pre-diabetes    Right ventricular dysfunction    per [facility] 02-20-24   Thyroid  disease    Wears glasses    Past Surgical History:  Procedure Laterality Date   ANTERIOR CRUCIATE LIGAMENT REPAIR Left 1990S   HERNIA REPAIR     JOINT REPLACEMENT  05/06/2020   KNEE ARTHROSCOPY W/ MEDIAL COLLATERAL LIGAMENT (MCL) REPAIR Left 1990s   KNEE ARTHROSCOPY W/ MENISCAL REPAIR Left 1990s   KNEE CLOSED REDUCTION Left 10/21/2020   Procedure: CLOSED MANIPULATION LEFT KNEE;  Surgeon: Josefina Chew, MD;  Location: [facility];  Service: Orthopedics;  Laterality: Left;   LIPOMA EXCISION     LEF SHOULDER AREA   PARTIAL KNEE ARTHROPLASTY Right 03/21/2024   Procedure: ARTHROPLASTY, KNEE, UNICOMPARTMENTAL;  Surgeon: Edna Toribio LABOR, MD;  Location: [facility];  Service: Orthopedics;  Laterality: Right;   RIGHT HEART CATH N/A 02/20/2024   Procedure: RIGHT HEART CATH;  Surgeon: Mady Bruckner, MD;  Location: [facility];  Service: Cardiovascular;  Laterality: N/A;   TOTAL KNEE ARTHROPLASTY Left 05/06/2020   Procedure: TOTAL KNEE ARTHROPLASTY;  Surgeon: Josefina Chew, MD;  Location: [facility];  Service: Orthopedics;  Laterality: Left;   UMBILICAL HERNIA REPAIR  07-17-2015  @[email or web address]    AND RIGHT ABDOMINAL WALL EXCISION LIPOMA   Patient Active Problem List   Diagnosis Date Noted   H/O right knee surgery 04/09/2024   Abnormal EKG 01/30/2024   Need for influenza vaccination 01/30/2024   Pre-op exam 01/30/2024   Class 1 obesity due to excess calories with serious comorbidity and body mass index (BMI) of 32.0 to 32.9 in adult 08/30/2023   Seasonal allergies 08/30/2023   Acute cough 08/30/2023   Rash and nonspecific skin eruption 04/13/2023   Encounter for annual health examination 04/07/2023   COVID-19 vaccination declined 04/07/2023   Influenza vaccination declined 04/07/2023   Herpes zoster vaccination declined 04/07/2023   Encounter for prostate cancer screening 04/07/2023   Impacted cerumen of right ear 04/07/2023   Arthritis 09/01/2022   Prediabetes 05/03/2022   Class  1 obesity due to excess calories with serious comorbidity and body mass index (BMI) of 31.0 to 31.9 in adult 05/03/2022   Complication associated with orthopedic device 02/10/2022   Mood disorder 08/18/2021   Osteoarthritis of left knee 06/04/2021   S/P total knee arthroplasty, left 05/06/2020   Eczema 04/17/2019   Mixed hyperlipidemia 08/02/2018   Numbness and tingling in both hands 08/02/2018   Essential hypertension 07/24/2018   H/O Graves' disease 07/24/2018   Attention deficit disorder 07/24/2018   Dyslipidemia 03/26/2016   Family history of premature CAD 03/26/2016   Tobacco abuse 08/13/2014   GERD without esophagitis 08/13/2014    Lumbar herniated disc 08/13/2014    PCP: Georgina Speaks, FNP  REFERRING PROVIDER: Edna Toribio LABOR, MD  REFERRING DIAG: S/P right medial unicompartmental knee arthroplasty on 03/21/24  THERAPY DIAG:  Right knee pain, unspecified chronicity  Stiffness of right knee, not elsewhere classified  Muscle weakness (generalized)  Localized edema  Difficulty in walking, not elsewhere classified  Cramp and spasm  Rationale for Evaluation and Treatment: Rehabilitation  ONSET DATE: S/P right medial unicompartmental knee arthroplasty on 03/21/24  SUBJECTIVE:   SUBJECTIVE STATEMENT: Patient reports he is doing well.  No pain.    PERTINENT HISTORY: S/P right medial unicompartmental knee arthroplasty on 03/21/24 HTN, Graves Disease, OA, Left Knee TKA 05/06/2020   PAIN:  04/11/24 Are you having pain? Yes: NPRS scale: 0/10 Pain location: right medial knee Pain description: sore Aggravating factors: sudden steps when walking Relieving factors: cold packs  PRECAUTIONS: None  RED FLAGS: None   WEIGHT BEARING RESTRICTIONS: No  FALLS:  Has patient fallen in last 6 months? 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  NEXT MD VISIT: Dr Edna on 04/05/2024  OBJECTIVE:  Note: Objective measures were completed at Evaluation unless otherwise noted.  DIAGNOSTIC FINDINGS:   Right knee radiograph on 03/21/2024: IMPRESSION: 1. Medial hemiarthroplasty in expected alignment. No immediate complications.  PATIENT SURVEYS:  LEFS  Extreme difficulty/unable (0), Quite a bit of difficulty (1), Moderate difficulty (2),  Little difficulty (3), No difficulty (4) Survey date:  03/26/24  Any of your usual work, housework or school activities 0  2. Usual hobbies, recreational or sporting activities 0  3. Getting into/out of the bath 0  4. Walking between rooms 2  5. Putting on socks/shoes 1  6. Squatting  0  7. Lifting an object, like a bag of groceries from the floor 0  8. Performing light activities around your home 1  9. Performing heavy activities around your home 0  10. Getting into/out of a car 0  11. Walking 2 blocks 0  12. Walking 1 mile 0  13. Going up/down 10 stairs (1 flight) 0  14. Standing for 1 hour 0  15.  sitting for 1 hour 1  16. Running on even ground 0  17. Running on uneven ground 0  18. Making sharp turns while running fast 0  19. Hopping  0  20. Rolling over in bed 1  Score total:  6/80 = 7.5%     COGNITION: Overall cognitive status: Within functional limits for  tasks assessed     SENSATION: Patient states that the numbness has gone away, but he is still having some itching  EDEMA:  Circumferential: Right- 44.5 cm, Left- 43 cm  MUSCLE LENGTH: Hamstrings: tightness bilat   PALPATION: Minimal tender to palpation around his knee  LOWER EXTREMITY ROM:  Active ROM Right eval Left eval  Knee flexion 90 107  Knee extension -20 0   (Blank rows = not tested)  LOWER EXTREMITY MMT:  Eval: Left LE strength:  5-/5 Right hip strength is 4/5 Right knee strength of 3- to 3/5   FUNCTIONAL TESTS:  5 times sit to stand: 26.23 sec with poor eccentric muscle control with stand to sit Timed up and go (TUG): 17.77 sec without AD, 12.72 sec with RW  GAIT: Distance walked: >50 ft Assistive device utilized: Environmental Consultant - 2 wheeled Level of assistance: Modified independence Comments: Antalgic                                                                                                                                 TREATMENT DATE:  04/11/2024: Nustep: LE's only x 5 min  level 5 Sit to stand x 10 in staggered  Squats to table x 10 with 15lbs  Single leg dead lift (left toe in back for balance) with 2 x 15 lbs  Seated clam with black loop x 20 Lateral band walks with red loop x 3 laps of 15 feet Cone touches 3 x 10  Reviewed importance of extension propping: demonstrated proper position Rocker board x 2 min Gastroc stretch on rocker board 10 x 10 sec Eccentric step down/up 2 x 10 off edge of bottom stair step Seated knee extension x 20 with 7 lbs Patient declined ice  04/04/2024: Nustep: LE's only x 5 min level 5 Sit to stand x 10  Squats to table x 10 Dead lift with 2 x 10lbs 2 sets of 10 Reviewed importance of extension propping: demonstrated proper position Rocker board x 2 min Gastroc stretch on rocker board 10 x 10 sec Soleus stretch 5 x on right x 10 sec Hip matrix (abduction & extension ) 45# right Seated LAQ with 5# 2 x 10 Leg Press (80# bilateral ) 2 x 10 Seated clam with black band x 20 Patient declined ice  04/03/2024: Recumbent Bike Level 1 x 5 min - PT present to discuss status Hurdled (forwards /sideways) x 3 each direction No UE support Stair negotiation x 2 laps (reciprocal pattern with UE support) Step ups at stair with unilateral 10# KB hold 2 x 10 Rt (forwards & lateral) Seated LAQ 2 x 10 with 6 lbs bilateral  Seated march x 20 with 5 lbs Standing HS curl with 6 lbs Leg Press (80# bilateral ) 2 x 10 Walk around both treatment gyms focusing on weight acceptance and equal WB Hip matrix (abduction & extension ) 45# bilateral  Single leg calf stretch x 30  sec bilateral    PATIENT EDUCATION:  Education details: Issued HEP Person educated: Patient Education method: Explanation, Demonstration, and Handouts Education comprehension: verbalized understanding  HOME EXERCISE PROGRAM: Access Code: [email or web address] URL: [email or web address] Date: 03/28/2024 Prepared by: Delon Haddock  Exercises - Active Straight  Leg Raise with Quad Set  - 1 x daily - 7 x weekly - 2 sets - 10 reps - Supine Heel Slide  - 1 x daily - 7 x weekly - 2 sets - 10 reps - Sit to Stand  - 1 x daily - 7 x weekly - 2 sets - 5 reps - Seated Long Arc Quad  - 1 x daily - 7 x weekly - 2 sets - 10 reps - Seated Heel Slide  - 1 x daily - 7 x weekly - 2 sets - 10 reps - Seated Quad Set  - 1 x daily - 7 x weekly - 2 sets - 10 reps - Standing Hip Abduction with Counter Support  - 1 x daily - 7 x weekly - 2 sets - 10 reps - Heel Raises with Counter Support  - 1 x daily - 7 x weekly - 2 sets - 10 reps - Standing March with Counter Support  - 1 x daily - 7 x weekly - 2 sets - 10 reps - Standing Knee Flexion with Unilateral Counter Support  - 1 x daily - 7 x weekly - 2 sets - 10 reps - Supine Hip Internal and External Rotation  - 1 x daily - 7 x weekly - 1 sets - 10 reps - Supine Hip Abduction  - 1 x daily - 7 x weekly - 2 sets - 10 reps  ASSESSMENT:  CLINICAL IMPRESSION: Patient is progressing appropriately.  He is diligent with his HEP.  He has good return of ROM and functional strength.  He has met all STG's.  He should continue to do well.    Overall, patient is progressing well post surgery and compliant with HEP.  He would benefit from continuing skilled PT to meet goals below.    OBJECTIVE IMPAIRMENTS: Abnormal gait, decreased activity tolerance, decreased balance, difficulty walking, decreased ROM, decreased strength, increased muscle spasms, impaired flexibility, and pain.   ACTIVITY LIMITATIONS: carrying, lifting, bending, standing, squatting, stairs, bed mobility, and locomotion level  PARTICIPATION LIMITATIONS: cleaning, community activity, and occupation  PERSONAL FACTORS: Time since onset of injury/illness/exacerbation and 3+ comorbidities: OA, s/p L THA, Graves Disease are also affecting patient's functional outcome.   REHAB POTENTIAL: Good  CLINICAL DECISION MAKING: Evolving/moderate complexity  EVALUATION COMPLEXITY:  Moderate   GOALS: Goals reviewed with patient? Yes  SHORT TERM GOALS: Target date: 04/16/2024 Patient will be independent with initial HEP.  Baseline: Goal status: MET 03/28/24  2.  Patient to participate in 6 minute walk test to establish a baseline.  Baseline:  Goal status: should be able to do easily  3.  Patient will improve right knee A/ROM to 10 to 95 degrees to allow for improved gait pattern. Baseline:  Goal status: MET 04/04/24   LONG TERM GOALS: Target date: 05/18/2024  Patient will be independent with advanced HEP to promote continued exercises post discharge.  Baseline:  Goal status: INITIAL  2.  Patient will increase Lower Extremity Functional Scale to at least 50% to demonstrate increased independence with functional activities.  Baseline: 7.5% Goal status: INITIAL  3.  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  Baseline:  Goal status: INITIAL  4.  Patient will report ability to start a walking/jogging exercise program without increased pain.  Baseline:  Goal status: INITIAL  5.  Patient will increase his strength to [facility] to allow him to navigate stairs with reciprocal pattern.  Baseline:  Goal status: INITIAL  6.  Patient will increase knee A/ROM to 5 to 115 degrees to allow him to demonstrate skills for basketball practices. Baseline:  Goal status: INITIAL   PLAN:  PT FREQUENCY: 2-3x/week  PT DURATION: 8 weeks  PLANNED INTERVENTIONS: 97164- PT Re-evaluation, 97750- Physical Performance Testing, 97110-Therapeutic exercises, 97530- Therapeutic activity, [id number]- Neuromuscular re-education, 97535- Self Care, [id number]- Manual therapy, [id number]- Gait training, [phone]- Canalith repositioning, [id number]- Aquatic Therapy, [phone]- Electrical stimulation (unattended), [phone]- Electrical stimulation (manual), [id number]- Vasopneumatic device, [id number]- Ultrasound, [id number]- Ionotophoresis 4mg /ml Dexamethasone , 79439 (1-2 muscles), 20561 (3+ muscles)- Dry  Needling, Patient/Family education, Balance training, Stair training, Taping, Joint mobilization, Joint manipulation, Scar mobilization, Vestibular training, DME instructions, Cryotherapy, and Moist heat  PLAN FOR NEXT SESSION: Nustep, continue quad rehab, ROM and functional training.    Delon B. Genella Bas, PT 04/11/24 10:15 AM [facility] [address], [address] [place], [place] [address] Phone # [phone] Fax [phone]

## 2024-04-16 ENCOUNTER — Ambulatory Visit: Admitting: Physical Therapy

## 2024-04-16 ENCOUNTER — Telehealth: Payer: Self-pay

## 2024-04-16 ENCOUNTER — Encounter: Payer: Self-pay | Admitting: Physical Therapy

## 2024-04-16 ENCOUNTER — Ambulatory Visit: Payer: Self-pay | Admitting: Nurse Practitioner

## 2024-04-16 DIAGNOSIS — M25661 Stiffness of right knee, not elsewhere classified: Secondary | ICD-10-CM | POA: Insufficient documentation

## 2024-04-16 DIAGNOSIS — R252 Cramp and spasm: Secondary | ICD-10-CM | POA: Diagnosis not present

## 2024-04-16 DIAGNOSIS — M25561 Pain in right knee: Secondary | ICD-10-CM | POA: Insufficient documentation

## 2024-04-16 DIAGNOSIS — R262 Difficulty in walking, not elsewhere classified: Secondary | ICD-10-CM | POA: Insufficient documentation

## 2024-04-16 DIAGNOSIS — Z0000 Encounter for general adult medical examination without abnormal findings: Secondary | ICD-10-CM | POA: Insufficient documentation

## 2024-04-16 DIAGNOSIS — R600 Localized edema: Secondary | ICD-10-CM | POA: Insufficient documentation

## 2024-04-16 DIAGNOSIS — R2689 Other abnormalities of gait and mobility: Secondary | ICD-10-CM | POA: Diagnosis not present

## 2024-04-16 DIAGNOSIS — M6281 Muscle weakness (generalized): Secondary | ICD-10-CM | POA: Diagnosis not present

## 2024-04-16 NOTE — Telephone Encounter (Signed)
 Copied from CRM #[id number]. Topic: General - Other >> Apr 10, 2024 10:48 AM Travis F wrote: Reason for CRM: Consuelo with [facility] is calling in requesting the status of a Prior Authorization that is needed for patient to have a CT Scan done. They need it by Monday 10 AM Please advise.

## 2024-04-16 NOTE — Assessment & Plan Note (Signed)
 Blood pressure elevated, possibly due to smoking. Discussed regular monitoring. - Rechecked blood pressure before leaving the clinic. - Recommended checking blood pressure at least three times a week.

## 2024-04-16 NOTE — Therapy (Signed)
 OUTPATIENT PHYSICAL THERAPY LOWER EXTREMITY TREATMENT NOTE   Patient Name: Steven Gibson MRN: [id number] DOB:Jan 19, 1971, 53 y.o., male Today's Date: 04/16/2024  END OF SESSION:  PT End of Session - 04/16/24 1019     Visit Number 7    Date for Recertification  05/18/24    Authorization Type BCBS    Authorization Time Period [employer] CARIT[employer] AUTH REQ AT 27 VISITS $4 COPAY    Authorization - Visit Number 7    Authorization - Number of Visits 27    PT Start Time 0933    PT Stop Time 1015    PT Time Calculation (min) 42 min    Activity Tolerance Patient tolerated treatment well    Behavior During Therapy WFL for tasks assessed/performed            Past Medical History:  Diagnosis Date   ADHD (attention deficit hyperactivity disorder)    Anxiety    Arthritis    Arthrofibrosis of knee joint, left    post op TKA    History of Graves' disease 2008   s/p  RAI  same year , on medication for a year then stablized no meds since  (per pt followed by pcp)   Hyperlipidemia    Hypertension    followed by pcp   (in care everywhere pt had stress echo, 03-26-2016, negative for ishemia and echo normal)   Hyperthyroidism    grave's disease   Pre-diabetes    Right ventricular dysfunction    per [facility] 02-20-24   Thyroid  disease    Wears glasses    Past Surgical History:  Procedure Laterality Date   ANTERIOR CRUCIATE LIGAMENT REPAIR Left 1990S   HERNIA REPAIR     JOINT REPLACEMENT  05/06/2020   KNEE ARTHROSCOPY W/ MEDIAL COLLATERAL LIGAMENT (MCL) REPAIR Left 1990s   KNEE ARTHROSCOPY W/ MENISCAL REPAIR Left 1990s   KNEE CLOSED REDUCTION Left 10/21/2020   Procedure: CLOSED MANIPULATION LEFT KNEE;  Surgeon: Josefina Chew, MD;  Location: [facility];  Service: Orthopedics;  Laterality: Left;   LIPOMA EXCISION     LEF SHOULDER AREA   PARTIAL KNEE ARTHROPLASTY Right 03/21/2024   Procedure: ARTHROPLASTY, KNEE, UNICOMPARTMENTAL;  Surgeon: Edna Toribio LABOR, MD;  Location: [facility];  Service: Orthopedics;  Laterality: Right;   RIGHT HEART CATH N/A 02/20/2024   Procedure: RIGHT HEART CATH;  Surgeon: Mady Bruckner, MD;  Location: [facility];  Service: Cardiovascular;  Laterality: N/A;   TOTAL KNEE ARTHROPLASTY Left 05/06/2020   Procedure: TOTAL KNEE ARTHROPLASTY;  Surgeon: Josefina Chew, MD;  Location: [facility];  Service: Orthopedics;  Laterality: Left;   UMBILICAL HERNIA REPAIR  07-17-2015  [email or web address]    AND RIGHT ABDOMINAL WALL EXCISION LIPOMA   Patient Active Problem List   Diagnosis Date Noted   H/O right knee surgery 04/09/2024   Abnormal EKG 01/30/2024   Need for influenza vaccination 01/30/2024   Pre-op exam 01/30/2024   Class 1 obesity due to excess calories with serious comorbidity and body mass index (BMI) of 32.0 to 32.9 in adult 08/30/2023   Seasonal allergies 08/30/2023   Acute cough 08/30/2023   Rash and nonspecific skin eruption 04/13/2023   Encounter for annual health examination 04/07/2023   COVID-19 vaccination declined 04/07/2023   Influenza vaccination declined 04/07/2023   Herpes zoster vaccination declined 04/07/2023   Encounter for prostate cancer screening 04/07/2023   Impacted cerumen of right ear 04/07/2023   Arthritis 09/01/2022   Prediabetes 05/03/2022  Class 1 obesity due to excess calories with serious comorbidity and body mass index (BMI) of 31.0 to 31.9 in adult 05/03/2022   Complication associated with orthopedic device 02/10/2022   Mood disorder 08/18/2021   Osteoarthritis of left knee 06/04/2021   S/P total knee arthroplasty, left 05/06/2020   Eczema 04/17/2019   Mixed hyperlipidemia 08/02/2018   Numbness and tingling in both hands 08/02/2018   Essential hypertension 07/24/2018   H/O Graves' disease 07/24/2018   Attention deficit disorder 07/24/2018   Dyslipidemia 03/26/2016   Family history of premature CAD 03/26/2016   Tobacco abuse 08/13/2014   GERD without esophagitis  08/13/2014   Lumbar herniated disc 08/13/2014    PCP: Georgina Speaks, FNP  REFERRING PROVIDER: Edna Toribio LABOR, MD  REFERRING DIAG: S/P right medial unicompartmental knee arthroplasty on 03/21/24  THERAPY DIAG:  Right knee pain, unspecified chronicity  Stiffness of right knee, not elsewhere classified  Muscle weakness (generalized)  Localized edema  Difficulty in walking, not elsewhere classified  Cramp and spasm  Rationale for Evaluation and Treatment: Rehabilitation  ONSET DATE: S/P right medial unicompartmental knee arthroplasty on 03/21/24  SUBJECTIVE:   SUBJECTIVE STATEMENT: Patient reports he is doing good today. He is not currently having any pain.  PERTINENT HISTORY: S/P right medial unicompartmental knee arthroplasty on 03/21/24 HTN, Graves Disease, OA, Left Knee TKA 05/06/2020   PAIN:  04/11/24 Are you having pain? Yes: NPRS scale: 0/10 Pain location: right medial knee Pain description: sore Aggravating factors: sudden steps when walking Relieving factors: cold packs  PRECAUTIONS: None  RED FLAGS: None   WEIGHT BEARING RESTRICTIONS: No  FALLS:  Has patient fallen in last 6 months? 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  NEXT MD VISIT: Dr Edna on 04/05/2024  OBJECTIVE:  Note: Objective measures were completed at Evaluation unless otherwise noted.  DIAGNOSTIC FINDINGS:   Right knee radiograph on 03/21/2024: IMPRESSION: 1. Medial hemiarthroplasty in expected alignment. No immediate complications.  PATIENT SURVEYS:  LEFS  Extreme difficulty/unable (0), Quite a bit  of difficulty (1), Moderate difficulty (2), Little difficulty (3), No difficulty (4) Survey date:  03/26/24 04/16/2024  Any of your usual work, housework or school activities 0 2  2. Usual hobbies, recreational or sporting activities 0 1  3. Getting into/out of the bath 0 2  4. Walking between rooms 2 2  5. Putting on socks/shoes 1 0  6. Squatting  0  3  7. Lifting an object, like a bag of groceries from the floor 0 2  8. Performing light activities around your home 1 2  9. Performing heavy activities around your home 0 0  10. Getting into/out of a car 0 3  11. Walking 2 blocks 0 3  12. Walking 1 mile 0 3  13. Going up/down 10 stairs (1 flight) 0 3  14. Standing for 1 hour 0 0  15.  sitting for 1 hour 1 3  16. Running on even ground 0 0  17. Running on uneven ground 0 0  18. Making sharp turns while running fast 0 0  19. Hopping  0 3  20. Rolling over in bed 1 4  Score total:  6/80 = 7.5% 36/80 45%     COGNITION: Overall cognitive status: Within functional limits for tasks assessed     SENSATION: Patient states that the numbness has gone away, but he is still having some itching  EDEMA:  Circumferential: Right- 44.5 cm, Left- 43 cm  MUSCLE LENGTH: Hamstrings: tightness bilat   PALPATION: Minimal tender to palpation around his knee  LOWER EXTREMITY ROM:  Active ROM Right eval Left eval  Knee flexion 90 107  Knee extension -20 0   (Blank rows = not tested)  LOWER EXTREMITY MMT:  Eval: Left LE strength:  5-/5 Right hip strength is 4/5 Right knee strength of 3- to 3/5   FUNCTIONAL TESTS:  5 times sit to stand: 26.23 sec with poor eccentric muscle control with stand to sit Timed up and go (TUG): 17.77 sec without AD, 12.72 sec with RW  GAIT: Distance walked: >50 ft Assistive device utilized: Environmental Consultant - 2 wheeled Level of assistance: Modified independence Comments: Antalgic                                                                                                                                  TREATMENT DATE:  04/16/2024: Nustep: LE's only x 6 min level 5 Seated clam with black loop x 20 Sit to stand x 10 in staggered (Rt LE behind) Squats to table  with 15lbs 2 x 10 Single leg dead lift (left toe in back for balance) with 2 x 10 15 lbs  Lateral band walks with red loop x 3 laps of 15 feet Standing on right + opposite leg reach (forwards, sideways back) x 8 each direction bilateral  LEFS see above Leg Press (80# bilateral ) 2 x 10 seat 9 [place] [address] with Rt x 4 Eccentric step down/up 2 x 10 off edge of bottom stair step Squats at wall with reb plyoball between back and wall x 10 Single leg balance + 15# KB pass infront x 20 Seated knee extension x 20 with 7.5 lbs Rocker board x 2 min Gastroc stretch on rocker board 3 x 20 sec      04/11/2024: Nustep: LE's only x 5 min level 5 Sit to stand x 10 in staggered  Squats to table x 10 with 15lbs  Single leg dead lift (left toe in back for balance) with 2 x 15 lbs  Seated clam with black loop x 20 Lateral band walks with red loop x 3 laps of 15 feet Cone touches 3 x 10  Reviewed importance of extension propping: demonstrated proper position Rocker board x 2 min Gastroc stretch on rocker board 10 x 10 sec Eccentric step down/up 2 x 10 off edge of bottom stair step Seated knee extension x 20 with 7 lbs Patient declined ice  04/04/2024: Nustep: LE's only x 5 min level 5 Sit to stand x 10  Squats to table x 10 Dead lift with 2 x 10lbs 2 sets of 10 Reviewed importance of extension propping: demonstrated proper position Rocker board x 2 min Gastroc stretch on rocker board 10 x 10 sec Soleus stretch 5 x on right x 10 sec Hip matrix (abduction & extension ) 45# right Seated LAQ with 5# 2 x 10 Leg Press (80# bilateral ) 2 x 10 Seated clam with black band x 20 Patient declined ice   PATIENT EDUCATION:  Education details: Issued HEP Person educated: Patient Education  method: Explanation, Demonstration, and Handouts Education comprehension: verbalized understanding  HOME EXERCISE PROGRAM: Access Code: [email or web address] URL: [email or web address] Date: 03/28/2024 Prepared by: Delon Haddock  Exercises - Active Straight Leg Raise with Quad Set  - 1 x daily - 7 x weekly - 2 sets - 10 reps - Supine Heel Slide  - 1 x daily - 7 x weekly - 2 sets - 10 reps - Sit to Stand  - 1 x daily - 7 x weekly - 2 sets - 5 reps - Seated Long Arc Quad  - 1 x daily - 7 x weekly - 2 sets - 10 reps - Seated Heel Slide  - 1 x daily - 7 x weekly - 2 sets - 10 reps - Seated Quad Set  - 1 x daily - 7 x weekly - 2 sets - 10 reps - Standing Hip Abduction with Counter Support  - 1 x daily - 7 x weekly - 2 sets - 10 reps - Heel Raises with Counter Support  - 1 x daily - 7 x weekly - 2 sets - 10 reps - Standing March with Counter Support  - 1 x daily - 7 x weekly - 2 sets - 10 reps - Standing Knee Flexion with Unilateral Counter Support  - 1 x daily - 7 x weekly - 2 sets - 10 reps - Supine Hip Internal and External Rotation  - 1 x daily - 7 x weekly - 1 sets - 10 reps - Supine Hip Abduction  - 1 x daily - 7 x weekly - 2 sets - 10 reps  ASSESSMENT:  CLINICAL IMPRESSION: Patient is responding well with skilled therapy. Progressed single leg strengthening exercises today. PT monitored patient response throughout and provided verbal and visual cues as needed. Noted improvements on his LEFS score since evaluation. Pre surgery he was walking 14 miles a day throughout the day. Currently he is walking on the treadmill for 10 mins. Getting in and out of the car is also still a challenge for him. Demonstrated exercises he can incorporated to improve motion. Patient will benefit from skilled PT to address the below impairments and improve overall function.    OBJECTIVE IMPAIRMENTS: Abnormal gait, decreased activity tolerance, decreased balance, difficulty walking, decreased ROM, decreased  strength, increased muscle spasms, impaired flexibility, and pain.   ACTIVITY LIMITATIONS: carrying, lifting, bending, standing, squatting, stairs, bed mobility, and locomotion level  PARTICIPATION LIMITATIONS: cleaning, community activity, and occupation  PERSONAL FACTORS: Time since onset of injury/illness/exacerbation and 3+ comorbidities: OA, s/p L THA, Graves Disease are also affecting patient's functional outcome.   REHAB POTENTIAL: Good  CLINICAL DECISION MAKING: Evolving/moderate complexity  EVALUATION COMPLEXITY: Moderate   GOALS: Goals reviewed with patient? Yes  SHORT TERM GOALS: Target date: 04/16/2024 Patient will be independent with initial HEP.  Baseline: Goal status: MET 03/28/24  2.  Patient to participate in 6 minute walk test to establish a baseline.  Baseline:  Goal status: should  be able to do easily  3.  Patient will improve right knee A/ROM to 10 to 95 degrees to allow for improved gait pattern. Baseline:  Goal status: MET 04/04/24   LONG TERM GOALS: Target date: 05/18/2024  Patient will be independent with advanced HEP to promote continued exercises post discharge.  Baseline:  Goal status: INITIAL  2.  Patient will increase Lower Extremity Functional Scale to at least 50% to demonstrate increased independence with functional activities.  Baseline: 7.5% Goal status: INITIAL  3.  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  Baseline:  Goal status: INITIAL  4.  Patient will report ability to start a walking/jogging exercise program without increased pain.  Baseline:  Goal status: INITIAL  5.  Patient will increase his strength to [facility] to allow him to navigate stairs with reciprocal pattern.  Baseline:  Goal status: INITIAL  6.  Patient will increase knee A/ROM to 5 to 115 degrees to allow him to demonstrate skills for basketball practices. Baseline:  Goal status: INITIAL   PLAN:  PT FREQUENCY: 2-3x/week  PT  DURATION: 8 weeks  PLANNED INTERVENTIONS: 97164- PT Re-evaluation, 97750- Physical Performance Testing, 97110-Therapeutic exercises, 97530- Therapeutic activity, [id number]- Neuromuscular re-education, 97535- Self Care, [id number]- Manual therapy, [phone]- Gait training, [phone]- Canalith repositioning, [id number]- Aquatic Therapy, [phone]- Electrical stimulation (unattended), [phone]- Electrical stimulation (manual), [id number]- Vasopneumatic device, [id number]- Ultrasound, [id number]- Ionotophoresis 4mg /ml Dexamethasone , 79439 (1-2 muscles), [id number] (3+ muscles)- Dry Needling, Patient/Family education, Balance training, Stair training, Taping, Joint mobilization, Joint manipulation, Scar mobilization, Vestibular training, DME instructions, Cryotherapy, and Moist heat  PLAN FOR NEXT SESSION: work on getting out of a car; Nustep, continue quad rehab, ROM and functional training.    Kristeen Sar, PT, DPT 04/16/24 10:20 AM [facility] [address] [place], [place] [address] Phone # [phone] Fax [phone]

## 2024-04-16 NOTE — Assessment & Plan Note (Signed)
 Cholesterol medication changed to simvastatin 20 mg. ASCVD risk is 19%. - Switched pravastatin  to simvastatin 20 mg. - Will repeat lipid panel in six months.

## 2024-04-16 NOTE — Assessment & Plan Note (Signed)
 Weight gain due to reduced activity post-surgery. Discussed mindful eating and exercise. - Encouraged mindful eating and regular exercise as tolerated.

## 2024-04-16 NOTE — Assessment & Plan Note (Signed)
 Smokes 3-4 cigarettes per day. Discussed impact on blood pressure and health.  CT lung scan recommended due to smoking history and age. - Ordered CT lung scan. - Instructed to follow up if not contacted by scheduling within two weeks.

## 2024-04-16 NOTE — Assessment & Plan Note (Signed)
 HgbA1c is stable, continue focusing on healthy diet.

## 2024-04-16 NOTE — Assessment & Plan Note (Signed)
 Routine wellness visit with focus on diet, exercise, and weight management post-surgery. - Continue healthy diet and regular exercise as tolerated post-surgery.

## 2024-04-17 ENCOUNTER — Other Ambulatory Visit

## 2024-04-17 ENCOUNTER — Telehealth: Payer: Self-pay | Admitting: Behavioral Health

## 2024-04-17 ENCOUNTER — Other Ambulatory Visit: Payer: Self-pay

## 2024-04-17 DIAGNOSIS — F900 Attention-deficit hyperactivity disorder, predominantly inattentive type: Secondary | ICD-10-CM

## 2024-04-17 NOTE — Telephone Encounter (Signed)
 Pended one RF based on next appt.

## 2024-04-17 NOTE — Telephone Encounter (Signed)
 PT wife called to ref rf of Adderall 3 month supply.    Appt 12/19

## 2024-04-18 ENCOUNTER — Ambulatory Visit: Admitting: Rehabilitative and Restorative Service Providers"

## 2024-04-18 ENCOUNTER — Encounter: Payer: Self-pay | Admitting: Rehabilitative and Restorative Service Providers"

## 2024-04-18 DIAGNOSIS — M25661 Stiffness of right knee, not elsewhere classified: Secondary | ICD-10-CM

## 2024-04-18 DIAGNOSIS — R600 Localized edema: Secondary | ICD-10-CM | POA: Diagnosis not present

## 2024-04-18 DIAGNOSIS — M25561 Pain in right knee: Secondary | ICD-10-CM

## 2024-04-18 DIAGNOSIS — M6281 Muscle weakness (generalized): Secondary | ICD-10-CM | POA: Diagnosis not present

## 2024-04-18 DIAGNOSIS — R2689 Other abnormalities of gait and mobility: Secondary | ICD-10-CM | POA: Diagnosis not present

## 2024-04-18 DIAGNOSIS — R262 Difficulty in walking, not elsewhere classified: Secondary | ICD-10-CM | POA: Diagnosis not present

## 2024-04-18 DIAGNOSIS — R252 Cramp and spasm: Secondary | ICD-10-CM | POA: Diagnosis not present

## 2024-04-18 NOTE — Therapy (Signed)
 OUTPATIENT PHYSICAL THERAPY LOWER EXTREMITY TREATMENT NOTE   Patient Name: Steven Gibson MRN: [id number] DOB:[birth year], 53 y.o., male Today's Date: 04/18/2024  END OF SESSION:  PT End of Session - 04/18/24 0936     Visit Number 8    Date for Recertification  05/18/24    Authorization Type BCBS    Authorization Time Period [employer] CARIT[employer] AUTH REQ AT 27 VISITS $4 COPAY    Authorization - Visit Number 8    Authorization - Number of Visits 27    PT Start Time 0933    PT Stop Time 1015    PT Time Calculation (min) 42 min    Activity Tolerance Patient tolerated treatment well    Behavior During Therapy WFL for tasks assessed/performed            Past Medical History:  Diagnosis Date   ADHD (attention deficit hyperactivity disorder)    Anxiety    Arthritis    Arthrofibrosis of knee joint, left    post op TKA    History of Graves' disease 2008   s/p  RAI  same year , on medication for a year then stablized no meds since  (per pt followed by pcp)   Hyperlipidemia    Hypertension    followed by pcp   (in care everywhere pt had stress echo, 03-26-2016, negative for ishemia and echo normal)   Hyperthyroidism    grave's disease   Pre-diabetes    Right ventricular dysfunction    per [facility] 02-20-24   Thyroid  disease    Wears glasses    Past Surgical History:  Procedure Laterality Date   ANTERIOR CRUCIATE LIGAMENT REPAIR Left 1990S   HERNIA REPAIR     JOINT REPLACEMENT  05/06/2020   KNEE ARTHROSCOPY W/ MEDIAL COLLATERAL LIGAMENT (MCL) REPAIR Left 1990s   KNEE ARTHROSCOPY W/ MENISCAL REPAIR Left 1990s   KNEE CLOSED REDUCTION Left 10/21/2020   Procedure: CLOSED MANIPULATION LEFT KNEE;  Surgeon: Josefina Chew, MD;  Location: [facility];  Service: Orthopedics;  Laterality: Left;   LIPOMA EXCISION     LEF SHOULDER AREA   PARTIAL KNEE ARTHROPLASTY Right 03/21/2024   Procedure: ARTHROPLASTY, KNEE, UNICOMPARTMENTAL;  Surgeon: Edna Toribio LABOR, MD;  Location: [facility];  Service: Orthopedics;  Laterality: Right;   RIGHT HEART CATH N/A 02/20/2024   Procedure: RIGHT HEART CATH;  Surgeon: Mady Bruckner, MD;  Location: [facility];  Service: Cardiovascular;  Laterality: N/A;   TOTAL KNEE ARTHROPLASTY Left 05/06/2020   Procedure: TOTAL KNEE ARTHROPLASTY;  Surgeon: Josefina Chew, MD;  Location: [facility];  Service: Orthopedics;  Laterality: Left;   UMBILICAL HERNIA REPAIR  07-17-2015  [email or web address]    AND RIGHT ABDOMINAL WALL EXCISION LIPOMA   Patient Active Problem List   Diagnosis Date Noted   Encounter for general adult medical examination w/o abnormal findings 04/16/2024   H/O right knee surgery 04/09/2024   Abnormal EKG 01/30/2024   Need for influenza vaccination 01/30/2024   Pre-op exam 01/30/2024   Class 1 obesity due to excess calories with serious comorbidity and body mass index (BMI) of 32.0 to 32.9 in adult 08/30/2023   Seasonal allergies 08/30/2023   Acute cough 08/30/2023   Rash and nonspecific skin eruption 04/13/2023   Encounter for annual health examination 04/07/2023   COVID-19 vaccination declined 04/07/2023   Influenza vaccination declined 04/07/2023   Herpes zoster vaccination declined 04/07/2023   Encounter for prostate cancer screening 04/07/2023   Impacted cerumen of right  ear 04/07/2023   Arthritis 09/01/2022   Prediabetes 05/03/2022   Class 1 obesity due to excess calories with serious comorbidity and body mass index (BMI) of 31.0 to 31.9 in adult 05/03/2022   Complication associated with orthopedic device 02/10/2022   Mood disorder 08/18/2021   Osteoarthritis of left knee 06/04/2021   S/P total knee arthroplasty, left 05/06/2020   Eczema 04/17/2019   Mixed hyperlipidemia 08/02/2018   Numbness and tingling in both hands 08/02/2018   Essential hypertension 07/24/2018   H/O Graves' disease 07/24/2018   Attention deficit disorder 07/24/2018   Dyslipidemia 03/26/2016   Family history of  premature CAD 03/26/2016   Tobacco abuse 08/13/2014   GERD without esophagitis 08/13/2014   Lumbar herniated disc 08/13/2014    PCP: Georgina Speaks, FNP  REFERRING PROVIDER: Edna Toribio LABOR, MD  REFERRING DIAG: S/P right medial unicompartmental knee arthroplasty on 03/21/24  THERAPY DIAG:  Right knee pain, unspecified chronicity  Stiffness of right knee, not elsewhere classified  Muscle weakness (generalized)  Localized edema  Difficulty in walking, not elsewhere classified  Rationale for Evaluation and Treatment: Rehabilitation  ONSET DATE: S/P right medial unicompartmental knee arthroplasty on 03/21/24  SUBJECTIVE:   SUBJECTIVE STATEMENT: Patient reports he is doing good today. He is not currently having any pain.  PERTINENT HISTORY: S/P right medial unicompartmental knee arthroplasty on 03/21/24 HTN, Graves Disease, OA, Left Knee TKA 05/06/2020   PAIN:  04/18/24 Are you having pain? Yes: NPRS scale: 0/10 Pain location: right medial knee Pain description: sore Aggravating factors: sudden steps when walking Relieving factors: cold packs  PRECAUTIONS: None  RED FLAGS: None   WEIGHT BEARING RESTRICTIONS: No  FALLS:  Has patient fallen in last 6 months? 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  NEXT MD VISIT: Dr Edna on 04/05/2024  OBJECTIVE:  Note: Objective measures were completed at Evaluation unless otherwise noted.  DIAGNOSTIC FINDINGS:   Right knee radiograph on 03/21/2024: IMPRESSION: 1. Medial hemiarthroplasty in expected alignment. No immediate complications.  PATIENT  SURVEYS:  LEFS  Extreme difficulty/unable (0), Quite a bit of difficulty (1), Moderate difficulty (2), Little difficulty (3), No difficulty (4) Survey date:  03/26/24 04/16/2024  Any of your usual work, housework or school activities 0 2  2. Usual hobbies, recreational or sporting activities 0 1  3. Getting into/out of the bath 0 2  4. Walking between rooms 2 2  5. Putting on socks/shoes 1 0  6. Squatting  0  3  7. Lifting an object, like a bag of groceries from the floor 0 2  8. Performing light activities around your home 1 2  9. Performing heavy activities around your home 0 0  10. Getting into/out of a car 0 3  11. Walking 2 blocks 0 3  12. Walking 1 mile 0 3  13. Going up/down 10 stairs (1 flight) 0 3  14. Standing for 1 hour 0 0  15.  sitting for 1 hour 1 3  16. Running on even ground 0 0  17. Running on uneven ground 0 0  18. Making sharp turns while running fast 0  0  19. Hopping  0 3  20. Rolling over in bed 1 4  Score total:  6/80 = 7.5% 36/80 45%     COGNITION: Overall cognitive status: Within functional limits for tasks assessed     SENSATION: Patient states that the numbness has gone away, but he is still having some itching  EDEMA:  Circumferential: Right- 44.5 cm, Left- 43 cm  MUSCLE LENGTH: Hamstrings: tightness bilat   PALPATION: Minimal tender to palpation around his knee  LOWER EXTREMITY ROM:  Active ROM Right eval Left eval  Knee flexion 90 107  Knee extension -20 0   (Blank rows = not tested)  LOWER EXTREMITY MMT:  Eval: Left LE strength:  5-/5 Right hip strength is 4/5 Right knee strength of 3- to 3/5   FUNCTIONAL TESTS:  Eval: 5 times sit to stand: 26.23 sec with poor eccentric muscle control with stand to sit Timed up and go (TUG): 17.77 sec without AD, 12.72 sec with RW  04/18/2024: 6 min walk test:  1,361 ft with RPE of 5/10 and notable antalgic gait towards end of ambulation (age related norms 2,110 ft) 5 times sit to/from  stand: 11.698 sec Timed up and go (TUG): 7.47 sec without AD  GAIT: Distance walked: >50 ft Assistive device utilized: Environmental Consultant - 2 wheeled Level of assistance: Modified independence Comments: Antalgic                                                                                                                                 TREATMENT DATE:   04/18/2024: Nustep level 5 (LE only) x3 min with PT present to discuss status Recumbent bike level 3 x 3 min with PT present to discuss status 5 times sit to stand and TUG 6 min walk test:  1,361 ft with RPE of 5/10 and notable antalgic gait towards end of ambulation Seated hamstring stretch 2x20 sec bilat Squat taps to mat with 15# kettlebell x10 Single leg dead lift (left toe in back for balance) with 15# kettlebell 2 x 10 (with cuing for breathing) Seated Long Arc Quad with 7.5# 2x10 bilat Standing rocker board for DF/PF x2 min   04/16/2024: Nustep: LE's only x 6 min level 5 Seated clam with black loop x 20 Sit to stand x 10 in staggered (Rt LE behind) Squats to table  with 15lbs 2 x 10 Single leg dead lift (left toe in back for balance) with 2 x 10 15 lbs  Lateral band walks with red loop x 3 laps of 15 feet Standing on right + opposite leg reach (forwards, sideways back) x 8 each direction bilateral  LEFS see above Leg Press (80# bilateral ) 2 x 10 seat 9 [place] [address] with Rt x 4 Eccentric step down/up 2 x 10 off edge of bottom stair step Squats at wall with reb plyoball between back and wall x 10 Single leg balance + 15# KB pass  infront x 20 Seated knee extension x 20 with 7.5 lbs Rocker board x 2 min Gastroc stretch on rocker board 3 x 20 sec   04/11/2024: Nustep: LE's only x 5 min level 5 Sit to stand x 10 in staggered  Squats to table x 10 with 15lbs  Single leg dead lift (left toe in back for balance) with 2 x 15 lbs  Seated clam with black loop x 20 Lateral band walks with red loop x 3 laps of 15 feet Cone touches  3 x 10  Reviewed importance of extension propping: demonstrated proper position Rocker board x 2 min Gastroc stretch on rocker board 10 x 10 sec Eccentric step down/up 2 x 10 off edge of bottom stair step Seated knee extension x 20 with 7 lbs Patient declined ice    PATIENT EDUCATION:  Education details: Issued HEP Person educated: Patient Education method: Explanation, Demonstration, and Handouts Education comprehension: verbalized understanding  HOME EXERCISE PROGRAM: Access Code: [email or web address] URL: [email or web address] Date: 03/28/2024 Prepared by: Delon Haddock  Exercises - Active Straight Leg Raise with Quad Set  - 1 x daily - 7 x weekly - 2 sets - 10 reps - Supine Heel Slide  - 1 x daily - 7 x weekly - 2 sets - 10 reps - Sit to Stand  - 1 x daily - 7 x weekly - 2 sets - 5 reps - Seated Long Arc Quad  - 1 x daily - 7 x weekly - 2 sets - 10 reps - Seated Heel Slide  - 1 x daily - 7 x weekly - 2 sets - 10 reps - Seated Quad Set  - 1 x daily - 7 x weekly - 2 sets - 10 reps - Standing Hip Abduction with Counter Support  - 1 x daily - 7 x weekly - 2 sets - 10 reps - Heel Raises with Counter Support  - 1 x daily - 7 x weekly - 2 sets - 10 reps - Standing March with Counter Support  - 1 x daily - 7 x weekly - 2 sets - 10 reps - Standing Knee Flexion with Unilateral Counter Support  - 1 x daily - 7 x weekly - 2 sets - 10 reps - Supine Hip Internal and External Rotation  - 1 x daily - 7 x weekly - 1 sets - 10 reps - Supine Hip Abduction  - 1 x daily - 7 x weekly - 2 sets - 10 reps  ASSESSMENT:  CLINICAL IMPRESSION: Patient is responding well with skilled therapy. Progressed single leg strengthening exercises today. PT monitored patient response throughout and provided verbal and visual cues as needed. Noted improvements on his LEFS score since evaluation. Pre surgery he was walking 14 miles a day throughout the day. Currently he is walking on the treadmill for 10 mins.  Getting in and out of the car is also still a challenge for him. Demonstrated exercises he can incorporated to improve motion. Patient will benefit from skilled PT to address the below impairments and improve overall function.    OBJECTIVE IMPAIRMENTS: Abnormal gait, decreased activity tolerance, decreased balance, difficulty walking, decreased ROM, decreased strength, increased muscle spasms, impaired flexibility, and pain.   ACTIVITY LIMITATIONS: carrying, lifting, bending, standing, squatting, stairs, bed mobility, and locomotion level  PARTICIPATION LIMITATIONS: cleaning, community activity, and occupation  PERSONAL FACTORS: Time since onset of injury/illness/exacerbation and 3+ comorbidities: OA, s/p L THA, Graves Disease are also affecting patient's functional outcome.   REHAB POTENTIAL:  Good  CLINICAL DECISION MAKING: Evolving/moderate complexity  EVALUATION COMPLEXITY: Moderate   GOALS: Goals reviewed with patient? Yes  SHORT TERM GOALS: Target date: 04/16/2024 Patient will be independent with initial HEP.  Baseline: Goal status: MET 03/28/24  2.  Patient to participate in 6 minute walk test to establish a baseline.  Baseline:  Goal status: Met on 04/18/24  3.  Patient will improve right knee A/ROM to 10 to 95 degrees to allow for improved gait pattern. Baseline:  Goal status: MET 04/04/24   LONG TERM GOALS: Target date: 05/18/2024  Patient will be independent with advanced HEP to promote continued exercises post discharge.  Baseline:  Goal status: Ongoing  2.  Patient will increase Lower Extremity Functional Scale to at least 50% to demonstrate increased independence with functional activities.  Baseline: 7.5% Goal status: Ongoing (see above)  3.  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  Baseline:  Goal status: INITIAL  4.  Patient will report ability to start a walking/jogging exercise program without increased pain.   Baseline:  Goal status: Ongoing  5.  Patient will increase his strength to [facility] to allow him to navigate stairs with reciprocal pattern.  Baseline:  Goal status: Ongoing  6.  Patient will increase knee A/ROM to 5 to 115 degrees to allow him to demonstrate skills for basketball practices. Baseline:  Goal status: INITIAL   PLAN:  PT FREQUENCY: 2-3x/week  PT DURATION: 8 weeks  PLANNED INTERVENTIONS: 97164- PT Re-evaluation, 97750- Physical Performance Testing, 97110-Therapeutic exercises, 97530- Therapeutic activity, 97112- Neuromuscular re-education, 97535- Self Care, 02859- Manual therapy, [phone]- Gait training, [phone]- Canalith repositioning, [id number]- Aquatic Therapy, [phone]- Electrical stimulation (unattended), [phone]- Electrical stimulation (manual), [id number]- Vasopneumatic device, [id number]- Ultrasound, [id number]- Ionotophoresis 4mg /ml Dexamethasone , 79439 (1-2 muscles), [id number] (3+ muscles)- Dry Needling, Patient/Family education, Balance training, Stair training, Taping, Joint mobilization, Joint manipulation, Scar mobilization, Vestibular training, DME instructions, Cryotherapy, and Moist heat  PLAN FOR NEXT SESSION: functional strengthening, balance, activity tolerance    Jarrell Laming, PT, DPT 04/18/24, 10:26 AM  [facility] [address] [place], [place] [address] Phone # [phone] Fax [phone]

## 2024-04-20 ENCOUNTER — Ambulatory Visit: Admitting: Rehabilitative and Restorative Service Providers"

## 2024-04-23 LAB — OPHTHALMOLOGY REPORT-SCANNED

## 2024-04-23 MED ORDER — AMPHETAMINE-DEXTROAMPHETAMINE 15 MG PO TABS: 15.0000 mg | ORAL_TABLET | Freq: Three times a day (TID) | ORAL | 0 refills | Status: DC

## 2024-04-24 ENCOUNTER — Encounter: Payer: Self-pay | Admitting: Rehabilitative and Restorative Service Providers"

## 2024-04-24 ENCOUNTER — Ambulatory Visit: Admitting: Rehabilitative and Restorative Service Providers"

## 2024-04-24 DIAGNOSIS — R600 Localized edema: Secondary | ICD-10-CM

## 2024-04-24 DIAGNOSIS — R262 Difficulty in walking, not elsewhere classified: Secondary | ICD-10-CM | POA: Diagnosis not present

## 2024-04-24 DIAGNOSIS — M25661 Stiffness of right knee, not elsewhere classified: Secondary | ICD-10-CM | POA: Diagnosis not present

## 2024-04-24 DIAGNOSIS — R2689 Other abnormalities of gait and mobility: Secondary | ICD-10-CM

## 2024-04-24 DIAGNOSIS — R252 Cramp and spasm: Secondary | ICD-10-CM | POA: Diagnosis not present

## 2024-04-24 DIAGNOSIS — M6281 Muscle weakness (generalized): Secondary | ICD-10-CM | POA: Diagnosis not present

## 2024-04-24 DIAGNOSIS — M25561 Pain in right knee: Secondary | ICD-10-CM

## 2024-04-24 NOTE — Therapy (Addendum)
 OUTPATIENT PHYSICAL THERAPY LOWER EXTREMITY TREATMENT NOTE   Patient Name: Steven Gibson MRN: [id number] DOB:02-13-1971, 53 y.o., male Today's Date: 04/24/2024  END OF SESSION:  PT End of Session - 04/24/24 1031     Visit Number 9    Date for Recertification  05/18/24    Authorization Time Period [employer] CARITAS AUTH REQ AT 27 VISITS $4 COPAY    Authorization - Visit Number 9    Authorization - Number of Visits 27    PT Start Time 1028   Pt arrived late for appointment   PT Stop Time 1100    PT Time Calculation (min) 32 min    Activity Tolerance Patient tolerated treatment well    Behavior During Therapy [facility] for tasks assessed/performed            Past Medical History:  Diagnosis Date   ADHD (attention deficit hyperactivity disorder)    Anxiety    Arthritis    Arthrofibrosis of knee joint, left    post op TKA    History of Graves' disease 2008   s/p  RAI  same year , on medication for a year then stablized no meds since  (per pt followed by pcp)   Hyperlipidemia    Hypertension    followed by pcp   (in care everywhere pt had stress echo, 03-26-2016, negative for ishemia and echo normal)   Hyperthyroidism    grave's disease   Pre-diabetes    Right ventricular dysfunction    per [facility] 02-20-24   Thyroid  disease    Wears glasses    Past Surgical History:  Procedure Laterality Date   ANTERIOR CRUCIATE LIGAMENT REPAIR Left 1990S   HERNIA REPAIR     JOINT REPLACEMENT  05/06/2020   KNEE ARTHROSCOPY W/ MEDIAL COLLATERAL LIGAMENT (MCL) REPAIR Left 1990s   KNEE ARTHROSCOPY W/ MENISCAL REPAIR Left 1990s   KNEE CLOSED REDUCTION Left 10/21/2020   Procedure: CLOSED MANIPULATION LEFT KNEE;  Surgeon: Josefina Chew, MD;  Location: [facility];  Service: Orthopedics;  Laterality: Left;   LIPOMA EXCISION     LEF SHOULDER AREA   PARTIAL KNEE ARTHROPLASTY Right 03/21/2024   Procedure: ARTHROPLASTY, KNEE, UNICOMPARTMENTAL;  Surgeon: Edna Toribio LABOR, MD;  Location: [facility];  Service: Orthopedics;  Laterality: Right;   RIGHT HEART CATH N/A 02/20/2024   Procedure: RIGHT HEART CATH;  Surgeon: Mady Bruckner, MD;  Location: [facility];  Service: Cardiovascular;  Laterality: N/A;   TOTAL KNEE ARTHROPLASTY Left 05/06/2020   Procedure: TOTAL KNEE ARTHROPLASTY;  Surgeon: Josefina Chew, MD;  Location: [facility];  Service: Orthopedics;  Laterality: Left;   UMBILICAL HERNIA REPAIR  07-17-2015  @[email or web address]    AND RIGHT ABDOMINAL WALL EXCISION LIPOMA   Patient Active Problem List   Diagnosis Date Noted   Encounter for general adult medical examination w/o abnormal findings 04/16/2024   H/O right knee surgery 04/09/2024   Abnormal EKG 01/30/2024   Need for influenza vaccination 01/30/2024   Pre-op exam 01/30/2024   Class 1 obesity due to excess calories with serious comorbidity and body mass index (BMI) of 32.0 to 32.9 in adult 08/30/2023   Seasonal allergies 08/30/2023   Acute cough 08/30/2023   Rash and nonspecific skin eruption 04/13/2023   Encounter for annual health examination 04/07/2023   COVID-19 vaccination declined 04/07/2023   Influenza vaccination declined 04/07/2023   Herpes zoster vaccination declined 04/07/2023   Encounter for prostate cancer screening 04/07/2023   Impacted cerumen of right  ear 04/07/2023   Arthritis 09/01/2022   Prediabetes 05/03/2022   Class 1 obesity due to excess calories with serious comorbidity and body mass index (BMI) of 31.0 to 31.9 in adult 05/03/2022   Complication associated with orthopedic device 02/10/2022   Mood disorder 08/18/2021   Osteoarthritis of left knee 06/04/2021   S/P total knee arthroplasty, left 05/06/2020   Eczema 04/17/2019   Mixed hyperlipidemia 08/02/2018   Numbness and tingling in both hands 08/02/2018   Essential hypertension 07/24/2018   H/O Graves' disease 07/24/2018   Attention deficit disorder 07/24/2018   Dyslipidemia 03/26/2016   Family history of  premature CAD 03/26/2016   Tobacco abuse 08/13/2014   GERD without esophagitis 08/13/2014   Lumbar herniated disc 08/13/2014    PCP: Georgina Speaks, FNP  REFERRING PROVIDER: Edna Toribio LABOR, MD  REFERRING DIAG: S/P right medial unicompartmental knee arthroplasty on 03/21/24  THERAPY DIAG:  Right knee pain, unspecified chronicity  Difficulty in walking, not elsewhere classified  Muscle weakness (generalized)  Other abnormalities of gait and mobility  Localized edema  Rationale for Evaluation and Treatment: Rehabilitation  ONSET DATE: S/P right medial unicompartmental knee arthroplasty on 03/21/24  SUBJECTIVE:   SUBJECTIVE STATEMENT: Patient reports he is not having pain, but is having tightness today.  PERTINENT HISTORY: S/P right medial unicompartmental knee arthroplasty on 03/21/24 HTN, Graves Disease, OA, Left Knee TKA 05/06/2020   PAIN:  04/18/24 Are you having pain? Yes: NPRS scale: 0/10 Pain location: right medial knee Pain description: sore Aggravating factors: sudden steps when walking Relieving factors: cold packs  PRECAUTIONS: None  RED FLAGS: None   WEIGHT BEARING RESTRICTIONS: No  FALLS:  Has patient fallen in last 6 months? 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  NEXT MD VISIT: Dr Edna on 04/05/2024  OBJECTIVE:  Note: Objective measures were completed at Evaluation unless otherwise noted.  DIAGNOSTIC FINDINGS:   Right knee radiograph on 03/21/2024: IMPRESSION: 1. Medial hemiarthroplasty in expected alignment. No immediate complications.  PATIENT SURVEYS:  LEFS   Extreme difficulty/unable (0), Quite a bit of difficulty (1), Moderate difficulty (2), Little difficulty (3), No difficulty (4) Survey date:  03/26/24 04/16/2024  Any of your usual work, housework or school activities 0 2  2. Usual hobbies, recreational or sporting activities 0 1  3. Getting into/out of the bath 0 2  4. Walking between rooms 2 2  5. Putting on socks/shoes 1 0  6. Squatting  0  3  7. Lifting an object, like a bag of groceries from the floor 0 2  8. Performing light activities around your home 1 2  9. Performing heavy activities around your home 0 0  10. Getting into/out of a car 0 3  11. Walking 2 blocks 0 3  12. Walking 1 mile 0 3  13. Going up/down 10 stairs (1 flight) 0 3  14. Standing for 1 hour 0 0  15.  sitting for 1 hour 1 3  16. Running on even ground 0 0  17. Running on uneven ground 0 0  18. Making sharp turns while running fast 0 0  19.  Hopping  0 3  20. Rolling over in bed 1 4  Score total:  6/80 = 7.5% 36/80 45%     COGNITION: Overall cognitive status: Within functional limits for tasks assessed     SENSATION: Patient states that the numbness has gone away, but he is still having some itching  EDEMA:  Circumferential: Right- 44.5 cm, Left- 43 cm  MUSCLE LENGTH: Hamstrings: tightness bilat   PALPATION: Minimal tender to palpation around his knee  LOWER EXTREMITY ROM:  Active ROM Right eval Right 04/24/24 Left eval  Knee flexion 90 112 107  Knee extension -20 0 0   (Blank rows = not tested)  LOWER EXTREMITY MMT:  Eval: Left LE strength:  5-/5 Right hip strength is 4/5 Right knee strength of 3- to 3/5   FUNCTIONAL TESTS:  Eval: 5 times sit to stand: 26.23 sec with poor eccentric muscle control with stand to sit Timed up and go (TUG): 17.77 sec without AD, 12.72 sec with RW  04/18/2024: 6 min walk test:  1,361 ft with RPE of 5/10 and notable antalgic gait towards end of ambulation (age related norms 2,110 ft) 5 times sit  to/from stand: 11.698 sec Timed up and go (TUG): 7.47 sec without AD  GAIT: Distance walked: >50 ft Assistive device utilized: Environmental Consultant - 2 wheeled Level of assistance: Modified independence Comments: Antalgic                                                                                                                                 TREATMENT DATE:   04/24/2024: Recumbent bike:  level 2 x5 min with PT present to discuss status Squat taps to mat with 15# kettlebell 2x10 Single leg dead lift (alt toe in back for balance) with 15# kettlebell 2 x 10 bilat (with cuing for technique) Side stepping with red loop around ankles 2x10 ft bilat FWD and backwards monster walking with red loop around ankles 2x10 ft each  Seated hamstring stretch 2x20 sec bilat   04/18/2024: Nustep level 5 (LE only) x3 min with PT present to discuss status Recumbent bike level 3 x 3 min with PT present to discuss status 5 times sit to stand and TUG 6 min walk test:  1,361 ft with RPE of 5/10 and notable antalgic gait towards end of ambulation Seated hamstring stretch 2x20 sec bilat Squat taps to mat with 15# kettlebell x10 Single leg dead lift (left toe in back for balance) with 15# kettlebell 2 x 10 (with cuing for breathing) Seated Long Arc Quad with 7.5# 2x10 bilat Standing rocker board for DF/PF x2 min   04/16/2024: Nustep: LE's only x 6 min level 5 Seated clam with black loop x 20 Sit to stand x 10 in staggered (Rt LE behind) Squats to table  with 15lbs 2 x 10 Single leg dead lift (left toe in back for balance) with 2 x 10 15 lbs  Lateral band walks  with red loop x 3 laps of 15 feet Standing on right + opposite leg reach (forwards, sideways back) x 8 each direction bilateral  LEFS see above Leg Press (80# bilateral ) 2 x 10 seat 9 [place] [address] with Rt x 4 Eccentric step down/up 2 x 10 off edge of bottom stair step Squats at wall with reb plyoball between back and wall x 10 Single leg balance +  15# KB pass infront x 20 Seated knee extension x 20 with 7.5 lbs Rocker board x 2 min Gastroc stretch on rocker board 3 x 20 sec    PATIENT EDUCATION:  Education details: Issued HEP Person educated: Patient Education method: Programmer, Multimedia, Facilities Manager, and Handouts Education comprehension: verbalized understanding  HOME EXERCISE PROGRAM: Access Code: [email or web address] URL: [email or web address] Date: 03/28/2024 Prepared by: Delon Haddock  Exercises - Active Straight Leg Raise with Quad Set  - 1 x daily - 7 x weekly - 2 sets - 10 reps - Supine Heel Slide  - 1 x daily - 7 x weekly - 2 sets - 10 reps - Sit to Stand  - 1 x daily - 7 x weekly - 2 sets - 5 reps - Seated Long Arc Quad  - 1 x daily - 7 x weekly - 2 sets - 10 reps - Seated Heel Slide  - 1 x daily - 7 x weekly - 2 sets - 10 reps - Seated Quad Set  - 1 x daily - 7 x weekly - 2 sets - 10 reps - Standing Hip Abduction with Counter Support  - 1 x daily - 7 x weekly - 2 sets - 10 reps - Heel Raises with Counter Support  - 1 x daily - 7 x weekly - 2 sets - 10 reps - Standing March with Counter Support  - 1 x daily - 7 x weekly - 2 sets - 10 reps - Standing Knee Flexion with Unilateral Counter Support  - 1 x daily - 7 x weekly - 2 sets - 10 reps - Supine Hip Internal and External Rotation  - 1 x daily - 7 x weekly - 1 sets - 10 reps - Supine Hip Abduction  - 1 x daily - 7 x weekly - 2 sets - 10 reps  ASSESSMENT:  CLINICAL IMPRESSION:  Steven Gibson presents to skilled PT reporting that he lost track of time and that is why he is late.  Patient reports feeling some tightness this morning, but denies any pain.  Patient with improved right knee A/ROM noted today.  Patient provided with cuing for modified single leg dead lift today to increase with use of the glutes during exercise.  Patient reported fatigue with side stepping and monster walk today.  Patient continues to progress towards goal related activities.  OBJECTIVE  IMPAIRMENTS: Abnormal gait, decreased activity tolerance, decreased balance, difficulty walking, decreased ROM, decreased strength, increased muscle spasms, impaired flexibility, and pain.   ACTIVITY LIMITATIONS: carrying, lifting, bending, standing, squatting, stairs, bed mobility, and locomotion level  PARTICIPATION LIMITATIONS: cleaning, community activity, and occupation  PERSONAL FACTORS: Time since onset of injury/illness/exacerbation and 3+ comorbidities: OA, s/p L THA, Graves Disease are also affecting patient's functional outcome.   REHAB POTENTIAL: Good  CLINICAL DECISION MAKING: Evolving/moderate complexity  EVALUATION COMPLEXITY: Moderate   GOALS: Goals reviewed with patient? Yes  SHORT TERM GOALS: Target date: 04/16/2024 Patient will be independent with initial HEP.  Baseline: Goal status: MET 03/28/24  2.  Patient to participate in 6 minute walk  test to establish a baseline.  Baseline:  Goal status: Met on 04/18/24  3.  Patient will improve right knee A/ROM to 10 to 95 degrees to allow for improved gait pattern. Baseline:  Goal status: MET 04/04/24   LONG TERM GOALS: Target date: 05/18/2024  Patient will be independent with advanced HEP to promote continued exercises post discharge.  Baseline:  Goal status: Ongoing  2.  Patient will increase Lower Extremity Functional Scale to at least 50% to demonstrate increased independence with functional activities.  Baseline: 7.5% Goal status: Ongoing (see above)  3.  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  Baseline:  Goal status: INITIAL  4.  Patient will report ability to start a walking/jogging exercise program without increased pain.  Baseline:  Goal status: Ongoing  5.  Patient will increase his strength to [facility] to allow him to navigate stairs with reciprocal pattern.  Baseline:  Goal status: Ongoing  6.  Patient will increase knee A/ROM to 5 to 115 degrees to allow him to  demonstrate skills for basketball practices. Baseline:  Goal status: Ongoing (see above)   PLAN:  PT FREQUENCY: 2-3x/week  PT DURATION: 8 weeks  PLANNED INTERVENTIONS: 97164- PT Re-evaluation, 97750- Physical Performance Testing, 97110-Therapeutic exercises, 97530- Therapeutic activity, 97112- Neuromuscular re-education, 97535- Self Care, [id number]- Manual therapy, [phone]- Gait training, [phone]- Canalith repositioning, [id number]- Aquatic Therapy, [phone]- Electrical stimulation (unattended), [phone]- Electrical stimulation (manual), [id number]- Vasopneumatic device, [id number]- Ultrasound, [id number]- Ionotophoresis 4mg /ml Dexamethasone , 79439 (1-2 muscles), [id number] (3+ muscles)- Dry Needling, Patient/Family education, Balance training, Stair training, Taping, Joint mobilization, Joint manipulation, Scar mobilization, Vestibular training, DME instructions, Cryotherapy, and Moist heat  PLAN FOR NEXT SESSION: functional strengthening, balance, activity tolerance    Jarrell Laming, PT, DPT 04/24/24, 11:12 AM  [facility] [address] [place], [place] [address] Phone # [phone] Fax [phone]

## 2024-04-25 ENCOUNTER — Telehealth: Payer: Self-pay | Admitting: Physical Therapy

## 2024-04-25 ENCOUNTER — Ambulatory Visit: Admitting: Physical Therapy

## 2024-04-25 NOTE — Telephone Encounter (Signed)
 Left patient a message about missed appointment. Reminded patient of next scheduled appointment and left a call back number for the clinic.  Hue Steveson, PT, DPT 04/25/24 10:15 AM

## 2024-04-26 ENCOUNTER — Encounter: Payer: Self-pay | Admitting: Nurse Practitioner

## 2024-04-27 ENCOUNTER — Ambulatory Visit: Admitting: Rehabilitative and Restorative Service Providers"

## 2024-04-27 ENCOUNTER — Encounter: Payer: Self-pay | Admitting: Rehabilitative and Restorative Service Providers"

## 2024-04-27 DIAGNOSIS — R252 Cramp and spasm: Secondary | ICD-10-CM | POA: Diagnosis not present

## 2024-04-27 DIAGNOSIS — R600 Localized edema: Secondary | ICD-10-CM

## 2024-04-27 DIAGNOSIS — M25661 Stiffness of right knee, not elsewhere classified: Secondary | ICD-10-CM | POA: Diagnosis not present

## 2024-04-27 DIAGNOSIS — R2689 Other abnormalities of gait and mobility: Secondary | ICD-10-CM

## 2024-04-27 DIAGNOSIS — M6281 Muscle weakness (generalized): Secondary | ICD-10-CM

## 2024-04-27 DIAGNOSIS — M25561 Pain in right knee: Secondary | ICD-10-CM

## 2024-04-27 DIAGNOSIS — R262 Difficulty in walking, not elsewhere classified: Secondary | ICD-10-CM

## 2024-04-27 NOTE — Therapy (Signed)
 OUTPATIENT PHYSICAL THERAPY LOWER EXTREMITY TREATMENT NOTE   Patient Name: Steven Gibson MRN: [id number] DOB:05-Aug-1970, 53 y.o., male Today's Date: 04/27/2024  END OF SESSION:  PT End of Session - 04/27/24 1019     Visit Number 10    Date for Recertification  05/18/24    Authorization Type BCBS    Authorization Time Period [employer] CARIT[employer] AUTH REQ AT 27 VISITS $4 COPAY    Authorization - Visit Number 10    Authorization - Number of Visits 27    PT Start Time 1018    PT Stop Time 1057    PT Time Calculation (min) 39 min    Activity Tolerance Patient tolerated treatment well    Behavior During Therapy WFL for tasks assessed/performed            Past Medical History:  Diagnosis Date   ADHD (attention deficit hyperactivity disorder)    Anxiety    Arthritis    Arthrofibrosis of knee joint, left    post op TKA    History of Graves' disease 2008   s/p  RAI  same year , on medication for a year then stablized no meds since  (per pt followed by pcp)   Hyperlipidemia    Hypertension    followed by pcp   (in care everywhere pt had stress echo, 03-26-2016, negative for ishemia and echo normal)   Hyperthyroidism    grave's disease   Pre-diabetes    Right ventricular dysfunction    per [facility] 02-20-24   Thyroid  disease    Wears glasses    Past Surgical History:  Procedure Laterality Date   ANTERIOR CRUCIATE LIGAMENT REPAIR Left 1990S   HERNIA REPAIR     JOINT REPLACEMENT  05/06/2020   KNEE ARTHROSCOPY W/ MEDIAL COLLATERAL LIGAMENT (MCL) REPAIR Left 1990s   KNEE ARTHROSCOPY W/ MENISCAL REPAIR Left 1990s   KNEE CLOSED REDUCTION Left 10/21/2020   Procedure: CLOSED MANIPULATION LEFT KNEE;  Surgeon: Josefina Chew, MD;  Location: [facility];  Service: Orthopedics;  Laterality: Left;   LIPOMA EXCISION     LEF SHOULDER AREA   PARTIAL KNEE ARTHROPLASTY Right 03/21/2024   Procedure: ARTHROPLASTY, KNEE, UNICOMPARTMENTAL;  Surgeon: Edna Toribio LABOR, MD;  Location: [facility];  Service: Orthopedics;  Laterality: Right;   RIGHT HEART CATH N/A 02/20/2024   Procedure: RIGHT HEART CATH;  Surgeon: Mady Bruckner, MD;  Location: [facility];  Service: Cardiovascular;  Laterality: N/A;   TOTAL KNEE ARTHROPLASTY Left 05/06/2020   Procedure: TOTAL KNEE ARTHROPLASTY;  Surgeon: Josefina Chew, MD;  Location: [facility];  Service: Orthopedics;  Laterality: Left;   UMBILICAL HERNIA REPAIR  07-17-2015  @[email or web address]    AND RIGHT ABDOMINAL WALL EXCISION LIPOMA   Patient Active Problem List   Diagnosis Date Noted   Encounter for general adult medical examination w/o abnormal findings 04/16/2024   H/O right knee surgery 04/09/2024   Abnormal EKG 01/30/2024   Need for influenza vaccination 01/30/2024   Pre-op exam 01/30/2024   Class 1 obesity due to excess calories with serious comorbidity and body mass index (BMI) of 32.0 to 32.9 in adult 08/30/2023   Seasonal allergies 08/30/2023   Acute cough 08/30/2023   Rash and nonspecific skin eruption 04/13/2023   Encounter for annual health examination 04/07/2023   COVID-19 vaccination declined 04/07/2023   Influenza vaccination declined 04/07/2023   Herpes zoster vaccination declined 04/07/2023   Encounter for prostate cancer screening 04/07/2023   Impacted cerumen of right  ear 04/07/2023   Arthritis 09/01/2022   Prediabetes 05/03/2022   Class 1 obesity due to excess calories with serious comorbidity and body mass index (BMI) of 31.0 to 31.9 in adult 05/03/2022   Complication associated with orthopedic device 02/10/2022   Mood disorder 08/18/2021   Osteoarthritis of left knee 06/04/2021   S/P total knee arthroplasty, left 05/06/2020   Eczema 04/17/2019   Mixed hyperlipidemia 08/02/2018   Numbness and tingling in both hands 08/02/2018   Essential hypertension 07/24/2018   H/O Graves' disease 07/24/2018   Attention deficit disorder 07/24/2018   Dyslipidemia 03/26/2016   Family history of  premature CAD 03/26/2016   Tobacco abuse 08/13/2014   GERD without esophagitis 08/13/2014   Lumbar herniated disc 08/13/2014    PCP: Georgina Speaks, FNP  REFERRING PROVIDER: Edna Toribio LABOR, MD  REFERRING DIAG: S/P right medial unicompartmental knee arthroplasty on 03/21/24  THERAPY DIAG:  Right knee pain, unspecified chronicity  Difficulty in walking, not elsewhere classified  Muscle weakness (generalized)  Other abnormalities of gait and mobility  Localized edema  Rationale for Evaluation and Treatment: Rehabilitation  ONSET DATE: S/P right medial unicompartmental knee arthroplasty on 03/21/24  SUBJECTIVE:   SUBJECTIVE STATEMENT: Patient states that he had to stand for a long time yesterday during the basketball game.  PERTINENT HISTORY: S/P right medial unicompartmental knee arthroplasty on 03/21/24 HTN, Graves Disease, OA, Left Knee TKA 05/06/2020   PAIN:  Are you having pain? Yes: NPRS scale: 4/10 Pain location: right medial knee Pain description: sore Aggravating factors: sudden steps when walking Relieving factors: cold packs  PRECAUTIONS: None  RED FLAGS: None   WEIGHT BEARING RESTRICTIONS: No  FALLS:  Has patient fallen in last 6 months? 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  NEXT MD VISIT: Dr Edna on 04/05/2024  OBJECTIVE:  Note: Objective measures were completed at Evaluation unless otherwise noted.  DIAGNOSTIC FINDINGS:   Right knee radiograph on 03/21/2024: IMPRESSION: 1. Medial hemiarthroplasty in expected alignment. No immediate complications.  PATIENT  SURVEYS:  LEFS  Extreme difficulty/unable (0), Quite a bit of difficulty (1), Moderate difficulty (2), Little difficulty (3), No difficulty (4) Survey date:  03/26/24 04/16/2024  Any of your usual work, housework or school activities 0 2  2. Usual hobbies, recreational or sporting activities 0 1  3. Getting into/out of the bath 0 2  4. Walking between rooms 2 2  5. Putting on socks/shoes 1 0  6. Squatting  0  3  7. Lifting an object, like a bag of groceries from the floor 0 2  8. Performing light activities around your home 1 2  9. Performing heavy activities around your home 0 0  10. Getting into/out of a car 0 3  11. Walking 2 blocks 0 3  12. Walking 1 mile 0 3  13. Going up/down 10 stairs (1 flight) 0 3  14. Standing for 1 hour 0 0  15.  sitting for 1 hour 1 3  16. Running on even ground 0 0  17. Running on uneven ground 0 0  18. Making sharp turns while running fast 0  0  19. Hopping  0 3  20. Rolling over in bed 1 4  Score total:  6/80 = 7.5% 36/80 45%     COGNITION: Overall cognitive status: Within functional limits for tasks assessed     SENSATION: Patient states that the numbness has gone away, but he is still having some itching  EDEMA:  Circumferential: Right- 44.5 cm, Left- 43 cm  MUSCLE LENGTH: Hamstrings: tightness bilat   PALPATION: Minimal tender to palpation around his knee  LOWER EXTREMITY ROM:  Active ROM Right eval Right 04/24/24 Left eval  Knee flexion 90 112 107  Knee extension -20 0 0   (Blank rows = not tested)  LOWER EXTREMITY MMT:  Eval: Left LE strength:  5-/5 Right hip strength is 4/5 Right knee strength of 3- to 3/5   FUNCTIONAL TESTS:  Eval: 5 times sit to stand: 26.23 sec with poor eccentric muscle control with stand to sit Timed up and go (TUG): 17.77 sec without AD, 12.72 sec with RW  04/18/2024: 6 min walk test:  1,361 ft with RPE of 5/10 and notable antalgic gait towards end of ambulation (age related norms 2,110 ft) 5  times sit to/from stand: 11.698 sec Timed up and go (TUG): 7.47 sec without AD  GAIT: Distance walked: >50 ft Assistive device utilized: Environmental Consultant - 2 wheeled Level of assistance: Modified independence Comments: Antalgic                                                                                                                                 TREATMENT DATE:   04/27/2024: Nustep level 5 (LE only) x3 min with PT present to discuss status Recumbent bike level 3 x 3 min with PT present to discuss status Squat taps to mat with 15# kettlebell 2x10 Single leg dead lift (alt toe in back for balance) with 15# kettlebell 2 x 10 bilat (with cuing for technique) Side stepping with red loop around ankles 2x10 ft bilat FWD and backwards monster walking with red loop around ankles 2x12 ft each  Leg Press (seat at 9) 130# 2x10 (with cuing for slower pacing) Standing hamstring stretch with foot on mat 2x20 sec bilat Standing knee flexion stretch with foot on mat x10 slow pulses bilat Standing rocker board for DF/PF x2 min Standing balance with rocker board in lateral position x1 min   04/24/2024: Recumbent bike:  level 2 x5 min with PT present to discuss status Squat taps to mat with 15# kettlebell 2x10 Single leg dead lift (alt toe in back for balance) with 15# kettlebell 2 x 10 bilat (with cuing for technique) Side stepping with red loop around ankles 2x10 ft bilat FWD and backwards monster walking with red loop around ankles 2x10 ft each  Seated hamstring stretch 2x20 sec bilat   04/18/2024: Nustep level 5 (LE only) x3 min with PT present to discuss status Recumbent bike level 3 x 3 min with PT  present to discuss status 5 times sit to stand and TUG 6 min walk test:  1,361 ft with RPE of 5/10 and notable antalgic gait towards end of ambulation Seated hamstring stretch 2x20 sec bilat Squat taps to mat with 15# kettlebell x10 Single leg dead lift (left toe in back for balance) with 15#  kettlebell 2 x 10 (with cuing for breathing) Seated Long Arc Quad with 7.5# 2x10 bilat Standing rocker board for DF/PF x2 min     PATIENT EDUCATION:  Education details: Issued HEP Person educated: Patient Education method: Explanation, Facilities Manager, and Handouts Education comprehension: verbalized understanding  HOME EXERCISE PROGRAM: Access Code: [email or web address] URL: [email or web address] Date: 03/28/2024 Prepared by: Delon Haddock  Exercises - Active Straight Leg Raise with Quad Set  - 1 x daily - 7 x weekly - 2 sets - 10 reps - Supine Heel Slide  - 1 x daily - 7 x weekly - 2 sets - 10 reps - Sit to Stand  - 1 x daily - 7 x weekly - 2 sets - 5 reps - Seated Long Arc Quad  - 1 x daily - 7 x weekly - 2 sets - 10 reps - Seated Heel Slide  - 1 x daily - 7 x weekly - 2 sets - 10 reps - Seated Quad Set  - 1 x daily - 7 x weekly - 2 sets - 10 reps - Standing Hip Abduction with Counter Support  - 1 x daily - 7 x weekly - 2 sets - 10 reps - Heel Raises with Counter Support  - 1 x daily - 7 x weekly - 2 sets - 10 reps - Standing March with Counter Support  - 1 x daily - 7 x weekly - 2 sets - 10 reps - Standing Knee Flexion with Unilateral Counter Support  - 1 x daily - 7 x weekly - 2 sets - 10 reps - Supine Hip Internal and External Rotation  - 1 x daily - 7 x weekly - 1 sets - 10 reps - Supine Hip Abduction  - 1 x daily - 7 x weekly - 2 sets - 10 reps  ASSESSMENT:  CLINICAL IMPRESSION:  Mr Bither presents to skilled PT reporting that he is having some pain from standing during the basketball game last night.  Patient is progressing with increased strengthening today and was able to perform leg press.  Patient continues to have difficulty with side steps and monster walking.  Patient continues to require occasional recovery periods due to dyspnea, but recovers quickly.  Patient continues to require skilled PT to progress towards goal related activities.  OBJECTIVE  IMPAIRMENTS: Abnormal gait, decreased activity tolerance, decreased balance, difficulty walking, decreased ROM, decreased strength, increased muscle spasms, impaired flexibility, and pain.   ACTIVITY LIMITATIONS: carrying, lifting, bending, standing, squatting, stairs, bed mobility, and locomotion level  PARTICIPATION LIMITATIONS: cleaning, community activity, and occupation  PERSONAL FACTORS: Time since onset of injury/illness/exacerbation and 3+ comorbidities: OA, s/p L THA, Graves Disease are also affecting patient's functional outcome.   REHAB POTENTIAL: Good  CLINICAL DECISION MAKING: Evolving/moderate complexity  EVALUATION COMPLEXITY: Moderate   GOALS: Goals reviewed with patient? Yes  SHORT TERM GOALS: Target date: 04/16/2024 Patient will be independent with initial HEP.  Baseline: Goal status: MET 03/28/24  2.  Patient to participate in 6 minute walk test to establish a baseline.  Baseline:  Goal status: Met on 04/18/24  3.  Patient will improve right knee A/ROM to 10 to 95 degrees to  allow for improved gait pattern. Baseline:  Goal status: MET 04/04/24   LONG TERM GOALS: Target date: 05/18/2024  Patient will be independent with advanced HEP to promote continued exercises post discharge.  Baseline:  Goal status: Ongoing  2.  Patient will increase Lower Extremity Functional Scale to at least 50% to demonstrate increased independence with functional activities.  Baseline: 7.5% Goal status: Ongoing (see above)  3.  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  Baseline:  Goal status: INITIAL  4.  Patient will report ability to start a walking/jogging exercise program without increased pain.  Baseline:  Goal status: Ongoing  5.  Patient will increase his strength to [facility] to allow him to navigate stairs with reciprocal pattern.  Baseline:  Goal status: Ongoing  6.  Patient will increase knee A/ROM to 5 to 115 degrees to allow him to  demonstrate skills for basketball practices. Baseline:  Goal status: Ongoing (see above)   PLAN:  PT FREQUENCY: 2-3x/week  PT DURATION: 8 weeks  PLANNED INTERVENTIONS: 97164- PT Re-evaluation, 97750- Physical Performance Testing, 97110-Therapeutic exercises, 97530- Therapeutic activity, 97112- Neuromuscular re-education, 97535- Self Care, [id number]- Manual therapy, [phone]- Gait training, [phone]- Canalith repositioning, [id number]- Aquatic Therapy, [phone]- Electrical stimulation (unattended), [phone]- Electrical stimulation (manual), [id number]- Vasopneumatic device, [id number]- Ultrasound, [id number]- Ionotophoresis 4mg /ml Dexamethasone , [id number] (1-2 muscles), [id number] (3+ muscles)- Dry Needling, Patient/Family education, Balance training, Stair training, Taping, Joint mobilization, Joint manipulation, Scar mobilization, Vestibular training, DME instructions, Cryotherapy, and Moist heat  PLAN FOR NEXT SESSION: functional strengthening, balance, activity tolerance    Jarrell Laming, PT, DPT 04/27/2024, 11:14 AM  [facility] [address] [place], [place] [address] Phone # [phone] Fax [phone]

## 2024-04-30 ENCOUNTER — Encounter: Payer: Self-pay | Admitting: Rehabilitative and Restorative Service Providers"

## 2024-04-30 ENCOUNTER — Ambulatory Visit: Admitting: Rehabilitative and Restorative Service Providers"

## 2024-04-30 DIAGNOSIS — R262 Difficulty in walking, not elsewhere classified: Secondary | ICD-10-CM | POA: Diagnosis not present

## 2024-04-30 DIAGNOSIS — R252 Cramp and spasm: Secondary | ICD-10-CM | POA: Diagnosis not present

## 2024-04-30 DIAGNOSIS — M6281 Muscle weakness (generalized): Secondary | ICD-10-CM

## 2024-04-30 DIAGNOSIS — M25561 Pain in right knee: Secondary | ICD-10-CM | POA: Diagnosis not present

## 2024-04-30 DIAGNOSIS — R600 Localized edema: Secondary | ICD-10-CM

## 2024-04-30 DIAGNOSIS — M25661 Stiffness of right knee, not elsewhere classified: Secondary | ICD-10-CM | POA: Diagnosis not present

## 2024-04-30 DIAGNOSIS — R2689 Other abnormalities of gait and mobility: Secondary | ICD-10-CM

## 2024-04-30 NOTE — Therapy (Signed)
 OUTPATIENT PHYSICAL THERAPY LOWER EXTREMITY TREATMENT NOTE   Patient Name: Steven Gibson MRN: [id number] DOB:[birth year], 53 y.o., male Today's Date: 04/30/2024  END OF SESSION:  PT End of Session - 04/30/24 0937     Visit Number 11    Date for Recertification  05/18/24    Authorization Type BCBS    Authorization Time Period [employer] CARIT[employer] AUTH REQ AT 27 VISITS $4 COPAY    Authorization - Visit Number 11    Authorization - Number of Visits 27    PT Start Time 0935    PT Stop Time 1015    PT Time Calculation (min) 40 min    Activity Tolerance Patient tolerated treatment well    Behavior During Therapy WFL for tasks assessed/performed            Past Medical History:  Diagnosis Date   ADHD (attention deficit hyperactivity disorder)    Anxiety    Arthritis    Arthrofibrosis of knee joint, left    post op TKA    History of Graves' disease 2008   s/p  RAI  same year , on medication for a year then stablized no meds since  (per pt followed by pcp)   Hyperlipidemia    Hypertension    followed by pcp   (in care everywhere pt had stress echo, 03-26-2016, negative for ishemia and echo normal)   Hyperthyroidism    grave's disease   Pre-diabetes    Right ventricular dysfunction    per [facility] 02-20-24   Thyroid  disease    Wears glasses    Past Surgical History:  Procedure Laterality Date   ANTERIOR CRUCIATE LIGAMENT REPAIR Left 1990S   HERNIA REPAIR     JOINT REPLACEMENT  05/06/2020   KNEE ARTHROSCOPY W/ MEDIAL COLLATERAL LIGAMENT (MCL) REPAIR Left 1990s   KNEE ARTHROSCOPY W/ MENISCAL REPAIR Left 1990s   KNEE CLOSED REDUCTION Left 10/21/2020   Procedure: CLOSED MANIPULATION LEFT KNEE;  Surgeon: Josefina Chew, MD;  Location: [facility];  Service: Orthopedics;  Laterality: Left;   LIPOMA EXCISION     LEF SHOULDER AREA   PARTIAL KNEE ARTHROPLASTY Right 03/21/2024   Procedure: ARTHROPLASTY, KNEE, UNICOMPARTMENTAL;  Surgeon: Edna Toribio LABOR, MD;  Location: [facility];  Service: Orthopedics;  Laterality: Right;   RIGHT HEART CATH N/A 02/20/2024   Procedure: RIGHT HEART CATH;  Surgeon: Mady Bruckner, MD;  Location: [facility];  Service: Cardiovascular;  Laterality: N/A;   TOTAL KNEE ARTHROPLASTY Left 05/06/2020   Procedure: TOTAL KNEE ARTHROPLASTY;  Surgeon: Josefina Chew, MD;  Location: [facility];  Service: Orthopedics;  Laterality: Left;   UMBILICAL HERNIA REPAIR  07-17-2015  @[email or web address]    AND RIGHT ABDOMINAL WALL EXCISION LIPOMA   Patient Active Problem List   Diagnosis Date Noted   Encounter for general adult medical examination w/o abnormal findings 04/16/2024   H/O right knee surgery 04/09/2024   Abnormal EKG 01/30/2024   Need for influenza vaccination 01/30/2024   Pre-op exam 01/30/2024   Class 1 obesity due to excess calories with serious comorbidity and body mass index (BMI) of 32.0 to 32.9 in adult 08/30/2023   Seasonal allergies 08/30/2023   Acute cough 08/30/2023   Rash and nonspecific skin eruption 04/13/2023   Encounter for annual health examination 04/07/2023   COVID-19 vaccination declined 04/07/2023   Influenza vaccination declined 04/07/2023   Herpes zoster vaccination declined 04/07/2023   Encounter for prostate cancer screening 04/07/2023   Impacted cerumen of right  ear 04/07/2023   Arthritis 09/01/2022   Prediabetes 05/03/2022   Class 1 obesity due to excess calories with serious comorbidity and body mass index (BMI) of 31.0 to 31.9 in adult 05/03/2022   Complication associated with orthopedic device 02/10/2022   Mood disorder 08/18/2021   Osteoarthritis of left knee 06/04/2021   S/P total knee arthroplasty, left 05/06/2020   Eczema 04/17/2019   Mixed hyperlipidemia 08/02/2018   Numbness and tingling in both hands 08/02/2018   Essential hypertension 07/24/2018   H/O Graves' disease 07/24/2018   Attention deficit disorder 07/24/2018   Dyslipidemia 03/26/2016   Family history of  premature CAD 03/26/2016   Tobacco abuse 08/13/2014   GERD without esophagitis 08/13/2014   Lumbar herniated disc 08/13/2014    PCP: Georgina Speaks, FNP  REFERRING PROVIDER: Edna Toribio LABOR, MD  REFERRING DIAG: S/P right medial unicompartmental knee arthroplasty on 03/21/24  THERAPY DIAG:  Right knee pain, unspecified chronicity  Difficulty in walking, not elsewhere classified  Muscle weakness (generalized)  Other abnormalities of gait and mobility  Localized edema  Rationale for Evaluation and Treatment: Rehabilitation  ONSET DATE: S/P right medial unicompartmental knee arthroplasty on 03/21/24  SUBJECTIVE:   SUBJECTIVE STATEMENT: Patient with no new complaints.  States pain still a 4/10  PERTINENT HISTORY: S/P right medial unicompartmental knee arthroplasty on 03/21/24 HTN, Graves Disease, OA, Left Knee TKA 05/06/2020   PAIN:  Are you having pain? Yes: NPRS scale: currently 4/10 Pain location: right medial knee Pain description: sore Aggravating factors: sudden steps when walking Relieving factors: cold packs  PRECAUTIONS: None  RED FLAGS: None   WEIGHT BEARING RESTRICTIONS: No  FALLS:  Has patient fallen in last 6 months? 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  NEXT MD VISIT: Dr Edna on 04/05/2024  OBJECTIVE:  Note: Objective measures were completed at Evaluation unless otherwise noted.  DIAGNOSTIC FINDINGS:   Right knee radiograph on 03/21/2024: IMPRESSION: 1. Medial hemiarthroplasty in expected alignment. No immediate complications.  PATIENT SURVEYS:  LEFS  Extreme  difficulty/unable (0), Quite a bit of difficulty (1), Moderate difficulty (2), Little difficulty (3), No difficulty (4) Survey date:  03/26/24 04/16/2024  Any of your usual work, housework or school activities 0 2  2. Usual hobbies, recreational or sporting activities 0 1  3. Getting into/out of the bath 0 2  4. Walking between rooms 2 2  5. Putting on socks/shoes 1 0  6. Squatting  0  3  7. Lifting an object, like a bag of groceries from the floor 0 2  8. Performing light activities around your home 1 2  9. Performing heavy activities around your home 0 0  10. Getting into/out of a car 0 3  11. Walking 2 blocks 0 3  12. Walking 1 mile 0 3  13. Going up/down 10 stairs (1 flight) 0 3  14. Standing for 1 hour 0 0  15.  sitting for 1 hour 1 3  16. Running on even ground 0 0  17. Running on uneven ground 0 0  18. Making sharp turns while running fast 0 0  19. Hopping  0 3  20. Rolling over in bed 1 4  Score total:  6/80 = 7.5% 36/80 45%     COGNITION: Overall cognitive status: Within functional limits for tasks assessed     SENSATION: Patient states that the numbness has gone away, but he is still having some itching  EDEMA:  Circumferential: Right- 44.5 cm, Left- 43 cm  MUSCLE LENGTH: Hamstrings: tightness bilat   PALPATION: Minimal tender to palpation around his knee  LOWER EXTREMITY ROM:  Active ROM Right eval Right 04/24/24 Left eval  Knee flexion 90 112 107  Knee extension -20 0 0   (Blank rows = not tested)  LOWER EXTREMITY MMT:  Eval: Left LE strength:  5-/5 Right hip strength is 4/5 Right knee strength of 3- to 3/5   FUNCTIONAL TESTS:  Eval: 5 times sit to stand: 26.23 sec with poor eccentric muscle control with stand to sit Timed up and go (TUG): 17.77 sec without AD, 12.72 sec with RW  04/18/2024: 6 min walk test:  1,361 ft with RPE of 5/10 and notable antalgic gait towards end of ambulation (age related norms 2,110 ft) 5 times sit to/from stand:  11.698 sec Timed up and go (TUG): 7.47 sec without AD  GAIT: Distance walked: >50 ft Assistive device utilized: Environmental Consultant - 2 wheeled Level of assistance: Modified independence Comments: Antalgic                                                                                                                                 TREATMENT DATE:   04/30/2024: Recumbent bike level 3 x 6 min with PT present to discuss status Squat taps to mat with 15# kettlebell 2x10 Single leg dead lift (alt toe in back for balance) with 15# kettlebell 2 x 10 bilat Seated hamstring stretch 2x20 sec bilat Standing knee flexion stretch with foot on mat x10 slow pulses bilat Leg Press (seat at 9) 130# 2x10 Hip Matrix 40#:  hip abduction, hip extension.  2x10 each bilat Seated long arc quad with 7.5# 2x10 bilat   04/27/2024: Nustep level 5 (LE only) x3 min with PT present to discuss status Recumbent bike level 3 x 3 min with PT present to discuss status Squat taps to mat with 15# kettlebell 2x10 Single leg dead lift (alt toe in back for balance) with 15# kettlebell 2 x 10 bilat (with cuing for technique) Side stepping with red loop around ankles 2x10 ft bilat FWD and backwards monster walking with red loop around ankles 2x12 ft each  Leg Press (seat at 9) 130# 2x10 (with cuing for slower pacing) Standing hamstring stretch with foot on mat 2x20 sec bilat Standing knee flexion stretch with foot on mat x10 slow pulses bilat Standing rocker board for DF/PF x2 min Standing balance with rocker board in lateral position x1 min   04/24/2024: Recumbent bike:  level 2 x5 min with PT present to discuss status Squat taps to  mat with 15# kettlebell 2x10 Single leg dead lift (alt toe in back for balance) with 15# kettlebell 2 x 10 bilat (with cuing for technique) Side stepping with red loop around ankles 2x10 ft bilat FWD and backwards monster walking with red loop around ankles 2x10 ft each  Seated hamstring stretch  2x20 sec bilat    PATIENT EDUCATION:  Education details: Issued HEP Person educated: Patient Education method: Explanation, Demonstration, and Handouts Education comprehension: verbalized understanding  HOME EXERCISE PROGRAM: Access Code: [email or web address] URL: [email or web address] Date: 03/28/2024 Prepared by: Delon Haddock  Exercises - Active Straight Leg Raise with Quad Set  - 1 x daily - 7 x weekly - 2 sets - 10 reps - Supine Heel Slide  - 1 x daily - 7 x weekly - 2 sets - 10 reps - Sit to Stand  - 1 x daily - 7 x weekly - 2 sets - 5 reps - Seated Long Arc Quad  - 1 x daily - 7 x weekly - 2 sets - 10 reps - Seated Heel Slide  - 1 x daily - 7 x weekly - 2 sets - 10 reps - Seated Quad Set  - 1 x daily - 7 x weekly - 2 sets - 10 reps - Standing Hip Abduction with Counter Support  - 1 x daily - 7 x weekly - 2 sets - 10 reps - Heel Raises with Counter Support  - 1 x daily - 7 x weekly - 2 sets - 10 reps - Standing March with Counter Support  - 1 x daily - 7 x weekly - 2 sets - 10 reps - Standing Knee Flexion with Unilateral Counter Support  - 1 x daily - 7 x weekly - 2 sets - 10 reps - Supine Hip Internal and External Rotation  - 1 x daily - 7 x weekly - 1 sets - 10 reps - Supine Hip Abduction  - 1 x daily - 7 x weekly - 2 sets - 10 reps  ASSESSMENT:  CLINICAL IMPRESSION:  Mr Marciano presents to skilled PT reporting that he is having similar 4/10 pain.  Patient able to continue to progress with flexibility and strengthening.  Able to add in hip matrix weight machine today for strengthening.  Patient continues to report a challenge with 7.5# on long arc quad, so stayed at that weight level.  Patient continues to tolerate rocker board stretch at end of session.  Patient continues to require skilled PT to progress towards goal related activities.  OBJECTIVE IMPAIRMENTS: Abnormal gait, decreased activity tolerance, decreased balance, difficulty walking, decreased ROM, decreased  strength, increased muscle spasms, impaired flexibility, and pain.   ACTIVITY LIMITATIONS: carrying, lifting, bending, standing, squatting, stairs, bed mobility, and locomotion level  PARTICIPATION LIMITATIONS: cleaning, community activity, and occupation  PERSONAL FACTORS: Time since onset of injury/illness/exacerbation and 3+ comorbidities: OA, s/p L THA, Graves Disease are also affecting patient's functional outcome.   REHAB POTENTIAL: Good  CLINICAL DECISION MAKING: Evolving/moderate complexity  EVALUATION COMPLEXITY: Moderate   GOALS: Goals reviewed with patient? Yes  SHORT TERM GOALS: Target date: 04/16/2024 Patient will be independent with initial HEP.  Baseline: Goal status: MET 03/28/24  2.  Patient to participate in 6 minute walk test to establish a baseline.  Baseline:  Goal status: Met on 04/18/24  3.  Patient will improve right knee A/ROM to 10 to 95 degrees to allow for improved gait pattern. Baseline:  Goal status: MET 04/04/24   LONG TERM GOALS: Target date:  05/18/2024  Patient will be independent with advanced HEP to promote continued exercises post discharge.  Baseline:  Goal status: Ongoing  2.  Patient will increase Lower Extremity Functional Scale to at least 50% to demonstrate increased independence with functional activities.  Baseline: 7.5% Goal status: Ongoing (see above)  3.  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  Baseline:  Goal status: INITIAL  4.  Patient will report ability to start a walking/jogging exercise program without increased pain.  Baseline:  Goal status: Ongoing  5.  Patient will increase his strength to [facility] to allow him to navigate stairs with reciprocal pattern.  Baseline:  Goal status: Ongoing  6.  Patient will increase knee A/ROM to 5 to 115 degrees to allow him to demonstrate skills for basketball practices. Baseline:  Goal status: Ongoing (see above)   PLAN:  PT FREQUENCY:  2-3x/week  PT DURATION: 8 weeks  PLANNED INTERVENTIONS: 97164- PT Re-evaluation, 97750- Physical Performance Testing, 97110-Therapeutic exercises, 97530- Therapeutic activity, 97112- Neuromuscular re-education, 97535- Self Care, [id number]- Manual therapy, [phone]- Gait training, [phone]- Canalith repositioning, [id number]- Aquatic Therapy, [phone]- Electrical stimulation (unattended), [phone]- Electrical stimulation (manual), [id number]- Vasopneumatic device, [id number]- Ultrasound, [id number]- Ionotophoresis 4mg /ml Dexamethasone , 79439 (1-2 muscles), [id number] (3+ muscles)- Dry Needling, Patient/Family education, Balance training, Stair training, Taping, Joint mobilization, Joint manipulation, Scar mobilization, Vestibular training, DME instructions, Cryotherapy, and Moist heat  PLAN FOR NEXT SESSION: functional strengthening, balance, activity tolerance    Jarrell Laming, PT, DPT 04/30/2024, 10:50 AM  [facility] [address] [place], [place] [address] Phone # [phone] Fax [phone]

## 2024-05-02 ENCOUNTER — Ambulatory Visit: Admitting: Rehabilitative and Restorative Service Providers"

## 2024-05-02 ENCOUNTER — Other Ambulatory Visit: Payer: Self-pay | Admitting: Nurse Practitioner

## 2024-05-02 ENCOUNTER — Encounter: Payer: Self-pay | Admitting: Rehabilitative and Restorative Service Providers"

## 2024-05-02 DIAGNOSIS — M25561 Pain in right knee: Secondary | ICD-10-CM | POA: Diagnosis not present

## 2024-05-02 DIAGNOSIS — M6281 Muscle weakness (generalized): Secondary | ICD-10-CM

## 2024-05-02 DIAGNOSIS — M25661 Stiffness of right knee, not elsewhere classified: Secondary | ICD-10-CM | POA: Diagnosis not present

## 2024-05-02 DIAGNOSIS — R2689 Other abnormalities of gait and mobility: Secondary | ICD-10-CM | POA: Diagnosis not present

## 2024-05-02 DIAGNOSIS — R252 Cramp and spasm: Secondary | ICD-10-CM | POA: Diagnosis not present

## 2024-05-02 DIAGNOSIS — R600 Localized edema: Secondary | ICD-10-CM | POA: Diagnosis not present

## 2024-05-02 DIAGNOSIS — R262 Difficulty in walking, not elsewhere classified: Secondary | ICD-10-CM

## 2024-05-02 NOTE — Therapy (Signed)
 OUTPATIENT PHYSICAL THERAPY TREATMENT NOTE   Patient Name: Steven Gibson MRN: [id number] DOB:[birth year], 53 y.o., male Today's Date: 05/02/2024  END OF SESSION:  PT End of Session - 05/02/24 0935     Visit Number 12    Date for Recertification  05/18/24    Authorization Type BCBS    Authorization Time Period [employer] CARIT[employer] AUTH REQ AT 27 VISITS $4 COPAY    Authorization - Visit Number 12    Authorization - Number of Visits 27    PT Start Time 0933    PT Stop Time 1015    PT Time Calculation (min) 42 min    Activity Tolerance Patient tolerated treatment well    Behavior During Therapy WFL for tasks assessed/performed            Past Medical History:  Diagnosis Date   ADHD (attention deficit hyperactivity disorder)    Anxiety    Arthritis    Arthrofibrosis of knee joint, left    post op TKA    History of Graves' disease 2008   s/p  RAI  same year , on medication for a year then stablized no meds since  (per pt followed by pcp)   Hyperlipidemia    Hypertension    followed by pcp   (in care everywhere pt had stress echo, 03-26-2016, negative for ishemia and echo normal)   Hyperthyroidism    grave's disease   Pre-diabetes    Right ventricular dysfunction    per [facility] 02-20-24   Thyroid  disease    Wears glasses    Past Surgical History:  Procedure Laterality Date   ANTERIOR CRUCIATE LIGAMENT REPAIR Left 1990S   HERNIA REPAIR     JOINT REPLACEMENT  05/06/2020   KNEE ARTHROSCOPY W/ MEDIAL COLLATERAL LIGAMENT (MCL) REPAIR Left 1990s   KNEE ARTHROSCOPY W/ MENISCAL REPAIR Left 1990s   KNEE CLOSED REDUCTION Left 10/21/2020   Procedure: CLOSED MANIPULATION LEFT KNEE;  Surgeon: Josefina Chew, MD;  Location: [facility];  Service: Orthopedics;  Laterality: Left;   LIPOMA EXCISION     LEF SHOULDER AREA   PARTIAL KNEE ARTHROPLASTY Right 03/21/2024   Procedure: ARTHROPLASTY, KNEE, UNICOMPARTMENTAL;  Surgeon: Edna Toribio LABOR, MD;   Location: [facility];  Service: Orthopedics;  Laterality: Right;   RIGHT HEART CATH N/A 02/20/2024   Procedure: RIGHT HEART CATH;  Surgeon: Mady Bruckner, MD;  Location: [facility];  Service: Cardiovascular;  Laterality: N/A;   TOTAL KNEE ARTHROPLASTY Left 05/06/2020   Procedure: TOTAL KNEE ARTHROPLASTY;  Surgeon: Josefina Chew, MD;  Location: [facility];  Service: Orthopedics;  Laterality: Left;   UMBILICAL HERNIA REPAIR  07-17-2015  @[email or web address]    AND RIGHT ABDOMINAL WALL EXCISION LIPOMA   Patient Active Problem List   Diagnosis Date Noted   Encounter for general adult medical examination w/o abnormal findings 04/16/2024   H/O right knee surgery 04/09/2024   Abnormal EKG 01/30/2024   Need for influenza vaccination 01/30/2024   Pre-op exam 01/30/2024   Class 1 obesity due to excess calories with serious comorbidity and body mass index (BMI) of 32.0 to 32.9 in adult 08/30/2023   Seasonal allergies 08/30/2023   Acute cough 08/30/2023   Rash and nonspecific skin eruption 04/13/2023   Encounter for annual health examination 04/07/2023   COVID-19 vaccination declined 04/07/2023   Influenza vaccination declined 04/07/2023   Herpes zoster vaccination declined 04/07/2023   Encounter for prostate cancer screening 04/07/2023   Impacted cerumen of right ear 04/07/2023  Arthritis 09/01/2022   Prediabetes 05/03/2022   Class 1 obesity due to excess calories with serious comorbidity and body mass index (BMI) of 31.0 to 31.9 in adult 05/03/2022   Complication associated with orthopedic device 02/10/2022   Mood disorder 08/18/2021   Osteoarthritis of left knee 06/04/2021   S/P total knee arthroplasty, left 05/06/2020   Eczema 04/17/2019   Mixed hyperlipidemia 08/02/2018   Numbness and tingling in both hands 08/02/2018   Essential hypertension 07/24/2018   H/O Graves' disease 07/24/2018   Attention deficit disorder 07/24/2018   Dyslipidemia 03/26/2016   Family history of premature CAD  03/26/2016   Tobacco abuse 08/13/2014   GERD without esophagitis 08/13/2014   Lumbar herniated disc 08/13/2014    PCP: Georgina Speaks, FNP  REFERRING PROVIDER: Edna Toribio LABOR, MD  REFERRING DIAG: S/P right medial unicompartmental knee arthroplasty on 03/21/24  THERAPY DIAG:  Right knee pain, unspecified chronicity  Difficulty in walking, not elsewhere classified  Muscle weakness (generalized)  Other abnormalities of gait and mobility  Localized edema  Rationale for Evaluation and Treatment: Rehabilitation  ONSET DATE: S/P right medial unicompartmental knee arthroplasty on 03/21/24  SUBJECTIVE:   SUBJECTIVE STATEMENT: Patient reports that he is doing okay, just a little stiffness.  PERTINENT HISTORY: S/P right medial unicompartmental knee arthroplasty on 03/21/24 HTN, Graves Disease, OA, Left Knee TKA 05/06/2020   PAIN:  Are you having pain? Yes: NPRS scale: 2/10 Pain location: right medial knee Pain description: sore Aggravating factors: sudden steps when walking Relieving factors: cold packs  PRECAUTIONS: None  RED FLAGS: None   WEIGHT BEARING RESTRICTIONS: No  FALLS:  Has patient fallen in last 6 months? 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  NEXT MD VISIT: Dr Edna on 04/05/2024  OBJECTIVE:  Note: Objective measures were completed at Evaluation unless otherwise noted.  DIAGNOSTIC FINDINGS:   Right knee radiograph on 03/21/2024: IMPRESSION: 1. Medial hemiarthroplasty in expected alignment. No immediate complications.  PATIENT SURVEYS:  LEFS  Extreme difficulty/unable  (0), Quite a bit of difficulty (1), Moderate difficulty (2), Little difficulty (3), No difficulty (4) Survey date:  03/26/24 04/16/2024  Any of your usual work, housework or school activities 0 2  2. Usual hobbies, recreational or sporting activities 0 1  3. Getting into/out of the bath 0 2  4. Walking between rooms 2 2  5. Putting on socks/shoes 1 0  6. Squatting  0  3  7. Lifting an object, like a bag of groceries from the floor 0 2  8. Performing light activities around your home 1 2  9. Performing heavy activities around your home 0 0  10. Getting into/out of a car 0 3  11. Walking 2 blocks 0 3  12. Walking 1 mile 0 3  13. Going up/down 10 stairs (1 flight) 0 3  14. Standing for 1 hour 0 0  15.  sitting for 1 hour 1 3  16. Running on even ground 0 0  17. Running on uneven ground 0 0  18. Making sharp turns while running fast 0 0  19. Hopping  0 3  20.  Rolling over in bed 1 4  Score total:  6/80 = 7.5% 36/80 45%     COGNITION: Overall cognitive status: Within functional limits for tasks assessed     SENSATION: Patient states that the numbness has gone away, but he is still having some itching  EDEMA:  Circumferential: Right- 44.5 cm, Left- 43 cm  MUSCLE LENGTH: Hamstrings: tightness bilat   PALPATION: Minimal tender to palpation around his knee  LOWER EXTREMITY ROM:  Active ROM Right eval Right 04/24/24 Right 05/02/24 Left eval  Knee flexion 90 112 120 107  Knee extension -20 0 0 0   (Blank rows = not tested)  LOWER EXTREMITY MMT:  Eval: Left LE strength:  5-/5 Right hip strength is 4/5 Right knee strength of 3- to 3/5   FUNCTIONAL TESTS:  Eval: 5 times sit to stand: 26.23 sec with poor eccentric muscle control with stand to sit Timed up and go (TUG): 17.77 sec without AD, 12.72 sec with RW  04/18/2024: 6 min walk test:  1,361 ft with RPE of 5/10 and notable antalgic gait towards end of ambulation (age related norms 2,110 ft) 5 times sit to/from  stand: 11.698 sec Timed up and go (TUG): 7.47 sec without AD  GAIT: Distance walked: >50 ft Assistive device utilized: Environmental Consultant - 2 wheeled Level of assistance: Modified independence Comments: Antalgic                                                                                                                                 TREATMENT DATE:   05/02/2024: Nustep level 6 (LE only) x3 min with PT present to discuss status Standing hamstring stretch at stairs 2x20 sec bilat Recumbent bike level 3 x 3 min with PT present to discuss status Hip Matrix 40#:  hip abduction, hip extension.  2x10 each bilat Seated long arc quad with 7.5# 2x10 bilat Seated hamstring curl with blue tband 2x10 bilat Forward T at barre 2x10 bilat Standing knee flexion with foot on PT mat performing x10 slow pulses bilat Standing rocker board for DF/PF x1 min   04/30/2024: Recumbent bike level 3 x 6 min with PT present to discuss status Squat taps to mat with 15# kettlebell 2x10 Single leg dead lift (alt toe in back for balance) with 15# kettlebell 2 x 10 bilat Seated hamstring stretch 2x20 sec bilat Standing knee flexion stretch with foot on mat x10 slow pulses bilat Leg Press (seat at 9) 130# 2x10 Hip Matrix 40#:  hip abduction, hip extension.  2x10 each bilat Seated long arc quad with 7.5# 2x10 bilat   04/27/2024: Nustep level 5 (LE only) x3 min with PT present to discuss status Recumbent bike level 3 x 3 min with PT present to discuss status Squat taps to mat with 15# kettlebell 2x10 Single leg dead lift (alt toe in back for balance) with 15# kettlebell 2 x 10 bilat (with cuing for technique) Side stepping with  red loop around ankles 2x10 ft bilat FWD and backwards monster walking with red loop around ankles 2x12 ft each  Leg Press (seat at 9) 130# 2x10 (with cuing for slower pacing) Standing hamstring stretch with foot on mat 2x20 sec bilat Standing knee flexion stretch with foot on mat x10 slow  pulses bilat Standing rocker board for DF/PF x2 min Standing balance with rocker board in lateral position x1 min    PATIENT EDUCATION:  Education details: Issued HEP Person educated: Patient Education method: Explanation, Facilities Manager, and Handouts Education comprehension: verbalized understanding  HOME EXERCISE PROGRAM: Access Code: [email or web address] URL: [email or web address] Date: 03/28/2024 Prepared by: Delon Haddock  Exercises - Active Straight Leg Raise with Quad Set  - 1 x daily - 7 x weekly - 2 sets - 10 reps - Supine Heel Slide  - 1 x daily - 7 x weekly - 2 sets - 10 reps - Sit to Stand  - 1 x daily - 7 x weekly - 2 sets - 5 reps - Seated Long Arc Quad  - 1 x daily - 7 x weekly - 2 sets - 10 reps - Seated Heel Slide  - 1 x daily - 7 x weekly - 2 sets - 10 reps - Seated Quad Set  - 1 x daily - 7 x weekly - 2 sets - 10 reps - Standing Hip Abduction with Counter Support  - 1 x daily - 7 x weekly - 2 sets - 10 reps - Heel Raises with Counter Support  - 1 x daily - 7 x weekly - 2 sets - 10 reps - Standing March with Counter Support  - 1 x daily - 7 x weekly - 2 sets - 10 reps - Standing Knee Flexion with Unilateral Counter Support  - 1 x daily - 7 x weekly - 2 sets - 10 reps - Supine Hip Internal and External Rotation  - 1 x daily - 7 x weekly - 1 sets - 10 reps - Supine Hip Abduction  - 1 x daily - 7 x weekly - 2 sets - 10 reps  ASSESSMENT:  CLINICAL IMPRESSION:  Steven Gibson presents to skilled PT reporting less pain today.  Patient with increased right knee A/ROM to 0 to 120 degrees.  Patient continues to progress with strengthening during his PT session.  Patient will follow up with his surgeon tomorrow for his post-op follow up.  Will continue based on any future recommendations by MD.  OBJECTIVE IMPAIRMENTS: Abnormal gait, decreased activity tolerance, decreased balance, difficulty walking, decreased ROM, decreased strength, increased muscle spasms, impaired  flexibility, and pain.   ACTIVITY LIMITATIONS: carrying, lifting, bending, standing, squatting, stairs, bed mobility, and locomotion level  PARTICIPATION LIMITATIONS: cleaning, community activity, and occupation  PERSONAL FACTORS: Time since onset of injury/illness/exacerbation and 3+ comorbidities: OA, s/p L THA, Graves Disease are also affecting patient's functional outcome.   REHAB POTENTIAL: Good  CLINICAL DECISION MAKING: Evolving/moderate complexity  EVALUATION COMPLEXITY: Moderate   GOALS: Goals reviewed with patient? Yes  SHORT TERM GOALS: Target date: 04/16/2024 Patient will be independent with initial HEP.  Baseline: Goal status: MET 03/28/24  2.  Patient to participate in 6 minute walk test to establish a baseline.  Baseline:  Goal status: Met on 04/18/24  3.  Patient will improve right knee A/ROM to 10 to 95 degrees to allow for improved gait pattern. Baseline:  Goal status: MET 04/04/24   LONG TERM GOALS: Target date: 05/18/2024  Patient will be independent with advanced  HEP to promote continued exercises post discharge.  Baseline:  Goal status: Ongoing  2.  Patient will increase Lower Extremity Functional Scale to at least 50% to demonstrate increased independence with functional activities.  Baseline: 7.5% Goal status: Ongoing (see above)  3.  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  Baseline:  Goal status: INITIAL  4.  Patient will report ability to start a walking/jogging exercise program without increased pain.  Baseline:  Goal status: Ongoing  5.  Patient will increase his strength to [facility] to allow him to navigate stairs with reciprocal pattern.  Baseline:  Goal status: Ongoing  6.  Patient will increase knee A/ROM to 5 to 115 degrees to allow him to demonstrate skills for basketball practices. Baseline:  Goal status: Ongoing (see above)   PLAN:  PT FREQUENCY: 2-3x/week  PT DURATION: 8 weeks  PLANNED  INTERVENTIONS: 97164- PT Re-evaluation, 97750- Physical Performance Testing, 97110-Therapeutic exercises, 97530- Therapeutic activity, 97112- Neuromuscular re-education, 97535- Self Care, 02859- Manual therapy, [phone]- Gait training, [phone]- Canalith repositioning, [id number]- Aquatic Therapy, [phone]- Electrical stimulation (unattended), [phone]- Electrical stimulation (manual), [id number]- Vasopneumatic device, [id number]- Ultrasound, [id number]- Ionotophoresis 4mg /ml Dexamethasone , 79439 (1-2 muscles), [id number] (3+ muscles)- Dry Needling, Patient/Family education, Balance training, Stair training, Taping, Joint mobilization, Joint manipulation, Scar mobilization, Vestibular training, DME instructions, Cryotherapy, and Moist heat  PLAN FOR NEXT SESSION: functional strengthening, balance, activity tolerance    Jarrell Laming, PT, DPT 05/02/2024, 10:30 AM  [facility] [address] [place], [place] [address] Phone # [phone] Fax [phone]

## 2024-05-03 DIAGNOSIS — Z96651 Presence of right artificial knee joint: Secondary | ICD-10-CM | POA: Diagnosis not present

## 2024-05-04 ENCOUNTER — Ambulatory Visit: Admitting: Behavioral Health

## 2024-05-04 ENCOUNTER — Ambulatory Visit: Admitting: Rehabilitative and Restorative Service Providers"

## 2024-05-04 ENCOUNTER — Encounter: Payer: Self-pay | Admitting: Rehabilitative and Restorative Service Providers"

## 2024-05-04 DIAGNOSIS — R600 Localized edema: Secondary | ICD-10-CM | POA: Diagnosis not present

## 2024-05-04 DIAGNOSIS — R262 Difficulty in walking, not elsewhere classified: Secondary | ICD-10-CM

## 2024-05-04 DIAGNOSIS — M6281 Muscle weakness (generalized): Secondary | ICD-10-CM

## 2024-05-04 DIAGNOSIS — M25661 Stiffness of right knee, not elsewhere classified: Secondary | ICD-10-CM | POA: Diagnosis not present

## 2024-05-04 DIAGNOSIS — R252 Cramp and spasm: Secondary | ICD-10-CM | POA: Diagnosis not present

## 2024-05-04 DIAGNOSIS — R2689 Other abnormalities of gait and mobility: Secondary | ICD-10-CM | POA: Diagnosis not present

## 2024-05-04 DIAGNOSIS — M25561 Pain in right knee: Secondary | ICD-10-CM

## 2024-05-04 NOTE — Therapy (Signed)
 " OUTPATIENT PHYSICAL THERAPY TREATMENT NOTE   Patient Name: Steven Gibson MRN: [id number] DOB:[birth year], 53 y.o., male Today's Date: 05/04/2024  END OF SESSION:  PT End of Session - 05/04/24 0955     Visit Number 13    Date for Recertification  05/18/24    Authorization Type BCBS    Authorization Time Period [employer] CARIT[employer] AUTH REQ AT 27 VISITS $4 COPAY    Authorization - Visit Number 13    Authorization - Number of Visits 27    PT Start Time 0931    PT Stop Time 1021    PT Time Calculation (min) 50 min    Activity Tolerance Patient tolerated treatment well    Behavior During Therapy WFL for tasks assessed/performed            Past Medical History:  Diagnosis Date   ADHD (attention deficit hyperactivity disorder)    Anxiety    Arthritis    Arthrofibrosis of knee joint, left    post op TKA    History of Graves' disease 2008   s/p  RAI  same year , on medication for a year then stablized no meds since  (per pt followed by pcp)   Hyperlipidemia    Hypertension    followed by pcp   (in care everywhere pt had stress echo, 03-26-2016, negative for ishemia and echo normal)   Hyperthyroidism    grave's disease   Pre-diabetes    Right ventricular dysfunction    per [facility] 02-20-24   Thyroid  disease    Wears glasses    Past Surgical History:  Procedure Laterality Date   ANTERIOR CRUCIATE LIGAMENT REPAIR Left 1990S   HERNIA REPAIR     JOINT REPLACEMENT  05/06/2020   KNEE ARTHROSCOPY W/ MEDIAL COLLATERAL LIGAMENT (MCL) REPAIR Left 1990s   KNEE ARTHROSCOPY W/ MENISCAL REPAIR Left 1990s   KNEE CLOSED REDUCTION Left 10/21/2020   Procedure: CLOSED MANIPULATION LEFT KNEE;  Surgeon: Josefina Chew, MD;  Location: [facility];  Service: Orthopedics;  Laterality: Left;   LIPOMA EXCISION     LEF SHOULDER AREA   PARTIAL KNEE ARTHROPLASTY Right 03/21/2024   Procedure: ARTHROPLASTY, KNEE, UNICOMPARTMENTAL;  Surgeon: Edna Toribio LABOR, MD;   Location: [facility];  Service: Orthopedics;  Laterality: Right;   RIGHT HEART CATH N/A 02/20/2024   Procedure: RIGHT HEART CATH;  Surgeon: Mady Bruckner, MD;  Location: [facility];  Service: Cardiovascular;  Laterality: N/A;   TOTAL KNEE ARTHROPLASTY Left 05/06/2020   Procedure: TOTAL KNEE ARTHROPLASTY;  Surgeon: Josefina Chew, MD;  Location: [facility];  Service: Orthopedics;  Laterality: Left;   UMBILICAL HERNIA REPAIR  07-17-2015  @[email or web address]    AND RIGHT ABDOMINAL WALL EXCISION LIPOMA   Patient Active Problem List   Diagnosis Date Noted   Encounter for general adult medical examination w/o abnormal findings 04/16/2024   H/O right knee surgery 04/09/2024   Abnormal EKG 01/30/2024   Need for influenza vaccination 01/30/2024   Pre-op exam 01/30/2024   Class 1 obesity due to excess calories with serious comorbidity and body mass index (BMI) of 32.0 to 32.9 in adult 08/30/2023   Seasonal allergies 08/30/2023   Acute cough 08/30/2023   Rash and nonspecific skin eruption 04/13/2023   Encounter for annual health examination 04/07/2023   COVID-19 vaccination declined 04/07/2023   Influenza vaccination declined 04/07/2023   Herpes zoster vaccination declined 04/07/2023   Encounter for prostate cancer screening 04/07/2023   Impacted cerumen of right ear  04/07/2023   Arthritis 09/01/2022   Prediabetes 05/03/2022   Class 1 obesity due to excess calories with serious comorbidity and body mass index (BMI) of 31.0 to 31.9 in adult 05/03/2022   Complication associated with orthopedic device 02/10/2022   Mood disorder 08/18/2021   Osteoarthritis of left knee 06/04/2021   S/P total knee arthroplasty, left 05/06/2020   Eczema 04/17/2019   Mixed hyperlipidemia 08/02/2018   Numbness and tingling in both hands 08/02/2018   Essential hypertension 07/24/2018   H/O Graves' disease 07/24/2018   Attention deficit disorder 07/24/2018   Dyslipidemia 03/26/2016   Family history of premature CAD  03/26/2016   Tobacco abuse 08/13/2014   GERD without esophagitis 08/13/2014   Lumbar herniated disc 08/13/2014    PCP: Georgina Speaks, FNP  REFERRING PROVIDER: Edna Toribio LABOR, MD  REFERRING DIAG: S/P right medial unicompartmental knee arthroplasty on 03/21/24  THERAPY DIAG:  Right knee pain, unspecified chronicity  Difficulty in walking, not elsewhere classified  Muscle weakness (generalized)  Other abnormalities of gait and mobility  Localized edema  Rationale for Evaluation and Treatment: Rehabilitation  ONSET DATE: S/P right medial unicompartmental knee arthroplasty on 03/21/24  SUBJECTIVE:   SUBJECTIVE STATEMENT: Patient reports increased pain after standing at game yesterday.  PERTINENT HISTORY: S/P right medial unicompartmental knee arthroplasty on 03/21/24 HTN, Graves Disease, OA, Left Knee TKA 05/06/2020   PAIN:  Are you having pain? Yes: NPRS scale: 5/10 Pain location: right medial knee Pain description: sore Aggravating factors: sudden steps when walking Relieving factors: cold packs  PRECAUTIONS: None  RED FLAGS: None   WEIGHT BEARING RESTRICTIONS: No  FALLS:  Has patient fallen in last 6 months? 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  NEXT MD VISIT: Dr Edna on 04/05/2024  OBJECTIVE:  Note: Objective measures were completed at Evaluation unless otherwise noted.  DIAGNOSTIC FINDINGS:   Right knee radiograph on 03/21/2024: IMPRESSION: 1. Medial hemiarthroplasty in expected alignment. No immediate complications.  PATIENT SURVEYS:  LEFS  Extreme difficulty/unable  (0), Quite a bit of difficulty (1), Moderate difficulty (2), Little difficulty (3), No difficulty (4) Survey date:  03/26/24 04/16/2024  Any of your usual work, housework or school activities 0 2  2. Usual hobbies, recreational or sporting activities 0 1  3. Getting into/out of the bath 0 2  4. Walking between rooms 2 2  5. Putting on socks/shoes 1 0  6. Squatting  0  3  7. Lifting an object, like a bag of groceries from the floor 0 2  8. Performing light activities around your home 1 2  9. Performing heavy activities around your home 0 0  10. Getting into/out of a car 0 3  11. Walking 2 blocks 0 3  12. Walking 1 mile 0 3  13. Going up/down 10 stairs (1 flight) 0 3  14. Standing for 1 hour 0 0  15.  sitting for 1 hour 1 3  16. Running on even ground 0 0  17. Running on uneven ground 0 0  18. Making sharp turns while running fast 0 0  19. Hopping  0 3  20. Rolling over in bed 1 4  Score total:  6/80 = 7.5% 36/80 45%     COGNITION: Overall cognitive status: Within functional limits for tasks assessed     SENSATION: Patient states that the numbness has gone away, but he is still having some itching  EDEMA:  Circumferential: Right- 44.5 cm, Left- 43 cm  MUSCLE LENGTH: Hamstrings: tightness bilat   PALPATION: Minimal tender to palpation around his knee  LOWER EXTREMITY ROM:  Active ROM Right eval Right 04/24/24 Right 05/02/24 Left eval  Knee flexion 90 112 120 107  Knee extension -20 0 0 0   (Blank rows = not tested)  LOWER EXTREMITY MMT:  Eval: Left LE strength:  5-/5 Right hip strength is 4/5 Right knee strength of 3- to 3/5   FUNCTIONAL TESTS:  Eval: 5 times sit to stand: 26.23 sec with poor eccentric muscle control with stand to sit Timed up and go (TUG): 17.77 sec without AD, 12.72 sec with RW  04/18/2024: 6 min walk test:  1,361 ft with RPE of 5/10 and notable antalgic gait towards end of ambulation (age related norms 2,110 ft) 5 times sit to/from  stand: 11.698 sec Timed up and go (TUG): 7.47 sec without AD  GAIT: Distance walked: >50 ft Assistive device utilized: Environmental Consultant - 2 wheeled Level of assistance: Modified independence Comments: Antalgic                                                                                                                                 TREATMENT DATE:   05/04/2024: Recumbent bike level 3 x 3 min with PT present to discuss status Standing hamstring stretch at stairs 2x20 sec bilat Manual:  addaday to right calf, hamstring, and glute secondary to patient reporting increased pain and tightness.  Pt reports decreased pain and relaxation after Seated long arc quad with 7.5# 2x10 bilat Seated hamstring curl with blue tband 2x10 bilat Forward T at barre 2x10 bilat Game ready to right knee x10 min, medium compression, x10 min   05/02/2024: Nustep level 6 (LE only) x3 min with PT present to discuss status Standing hamstring stretch at stairs 2x20 sec bilat Recumbent bike level 3 x 3 min with PT present to discuss status Hip Matrix 40#:  hip abduction, hip extension.  2x10 each bilat Seated long arc quad with 7.5# 2x10 bilat Seated hamstring curl with blue tband 2x10 bilat Forward T at barre 2x10 bilat Standing knee flexion with foot on PT mat performing x10 slow pulses bilat Standing rocker board for DF/PF x1 min   04/30/2024: Recumbent bike level 3 x 6 min with PT present to discuss status Squat taps to mat with 15# kettlebell 2x10 Single leg dead lift (alt toe in back for balance) with 15# kettlebell 2 x 10 bilat Seated hamstring stretch 2x20 sec bilat Standing knee flexion stretch with foot on mat x10 slow pulses bilat Leg Press (seat at 9)  130# 2x10 Hip Matrix 40#:  hip abduction, hip extension.  2x10 each bilat Seated long arc quad with 7.5# 2x10 bilat    PATIENT EDUCATION:  Education details: Issued HEP Person educated: Patient Education method: Explanation, Demonstration, and  Handouts Education comprehension: verbalized understanding  HOME EXERCISE PROGRAM: Access Code: [email or web address] URL: [email or web address] Date: 03/28/2024 Prepared by: Delon Haddock  Exercises - Active Straight Leg Raise with Quad Set  - 1 x daily - 7 x weekly - 2 sets - 10 reps - Supine Heel Slide  - 1 x daily - 7 x weekly - 2 sets - 10 reps - Sit to Stand  - 1 x daily - 7 x weekly - 2 sets - 5 reps - Seated Long Arc Quad  - 1 x daily - 7 x weekly - 2 sets - 10 reps - Seated Heel Slide  - 1 x daily - 7 x weekly - 2 sets - 10 reps - Seated Quad Set  - 1 x daily - 7 x weekly - 2 sets - 10 reps - Standing Hip Abduction with Counter Support  - 1 x daily - 7 x weekly - 2 sets - 10 reps - Heel Raises with Counter Support  - 1 x daily - 7 x weekly - 2 sets - 10 reps - Standing March with Counter Support  - 1 x daily - 7 x weekly - 2 sets - 10 reps - Standing Knee Flexion with Unilateral Counter Support  - 1 x daily - 7 x weekly - 2 sets - 10 reps - Supine Hip Internal and External Rotation  - 1 x daily - 7 x weekly - 1 sets - 10 reps - Supine Hip Abduction  - 1 x daily - 7 x weekly - 2 sets - 10 reps  ASSESSMENT:  CLINICAL IMPRESSION:  Mr Demartin presents to skilled PT reporting increased pain today due to standing for prolonged period due to the basketball game.  Patient reports feeling tightness in the back of his knee, so utilized the Addaday for manual therapy to assist with decreased tightness and decreased pain.  Patient reported feeling better after and was able to progress through session.  Patient continues to progress with goal related activities, but still some difficulty with forward T secondary to decreased instability.  Patient utilized Game Ready at end of session to assist with further decreased pain and swelling at end of session.  Patient continues to require skilled PT to progress towards goal related activities.  OBJECTIVE IMPAIRMENTS: Abnormal gait, decreased  activity tolerance, decreased balance, difficulty walking, decreased ROM, decreased strength, increased muscle spasms, impaired flexibility, and pain.   ACTIVITY LIMITATIONS: carrying, lifting, bending, standing, squatting, stairs, bed mobility, and locomotion level  PARTICIPATION LIMITATIONS: cleaning, community activity, and occupation  PERSONAL FACTORS: Time since onset of injury/illness/exacerbation and 3+ comorbidities: OA, s/p L THA, Graves Disease are also affecting patient's functional outcome.   REHAB POTENTIAL: Good  CLINICAL DECISION MAKING: Evolving/moderate complexity  EVALUATION COMPLEXITY: Moderate   GOALS: Goals reviewed with patient? Yes  SHORT TERM GOALS: Target date: 04/16/2024 Patient will be independent with initial HEP.  Baseline: Goal status: MET 03/28/24  2.  Patient to participate in 6 minute walk test to establish a baseline.  Baseline:  Goal status: Met on 04/18/24  3.  Patient will improve right knee A/ROM to 10 to 95 degrees to allow for improved gait pattern. Baseline:  Goal status: MET 04/04/24   LONG TERM GOALS: Target date: 05/18/2024  Patient will be independent with advanced HEP to promote continued exercises post discharge.  Baseline:  Goal status: Ongoing  2.  Patient will increase Lower Extremity Functional Scale to at least 50% to demonstrate increased independence with functional activities.  Baseline: 7.5% Goal status: Ongoing (see above)  3.  Patient will report ability to drive his car without increased pain.  Baseline:  Goal status: Ongoing  4.  Patient will report ability to start a walking/jogging exercise program without increased pain.  Baseline:  Goal status: Ongoing  5.  Patient will increase his strength to [facility] to allow him to navigate stairs with reciprocal pattern.  Baseline:  Goal status: Ongoing  6.  Patient will increase knee A/ROM to 5 to 115 degrees to allow him to demonstrate skills for basketball  practices. Baseline:  Goal status: Ongoing (see above)   PLAN:  PT FREQUENCY: 2-3x/week  PT DURATION: 8 weeks  PLANNED INTERVENTIONS: 97164- PT Re-evaluation, 97750- Physical Performance Testing, 97110-Therapeutic exercises, 97530- Therapeutic activity, 97112- Neuromuscular re-education, 97535- Self Care, 02859- Manual therapy, [phone]- Gait training, [phone]- Canalith repositioning, [id number]- Aquatic Therapy, [phone]- Electrical stimulation (unattended), [phone]- Electrical stimulation (manual), [id number]- Vasopneumatic device, [id number]- Ultrasound, [id number]- Ionotophoresis 4mg /ml Dexamethasone , 79439 (1-2 muscles), [id number] (3+ muscles)- Dry Needling, Patient/Family education, Balance training, Stair training, Taping, Joint mobilization, Joint manipulation, Scar mobilization, Vestibular training, DME instructions, Cryotherapy, and Moist heat  PLAN FOR NEXT SESSION: functional strengthening, balance, activity tolerance    Jarrell Laming, PT, DPT 05/04/2024, 10:20 AM  [facility] [address] [place], [place] [address] Phone # [phone] Fax [phone]  "

## 2024-05-07 ENCOUNTER — Encounter: Payer: Self-pay | Admitting: Physical Therapy

## 2024-05-07 ENCOUNTER — Ambulatory Visit: Admitting: Physical Therapy

## 2024-05-07 DIAGNOSIS — M25661 Stiffness of right knee, not elsewhere classified: Secondary | ICD-10-CM | POA: Diagnosis not present

## 2024-05-07 DIAGNOSIS — R600 Localized edema: Secondary | ICD-10-CM | POA: Diagnosis not present

## 2024-05-07 DIAGNOSIS — M25561 Pain in right knee: Secondary | ICD-10-CM | POA: Diagnosis not present

## 2024-05-07 DIAGNOSIS — R2689 Other abnormalities of gait and mobility: Secondary | ICD-10-CM | POA: Diagnosis not present

## 2024-05-07 DIAGNOSIS — R262 Difficulty in walking, not elsewhere classified: Secondary | ICD-10-CM

## 2024-05-07 DIAGNOSIS — M6281 Muscle weakness (generalized): Secondary | ICD-10-CM | POA: Diagnosis not present

## 2024-05-07 DIAGNOSIS — R252 Cramp and spasm: Secondary | ICD-10-CM | POA: Diagnosis not present

## 2024-05-07 NOTE — Therapy (Signed)
 " OUTPATIENT PHYSICAL THERAPY TREATMENT NOTE   Patient Name: Steven Gibson MRN: [id number] DOB:08-Apr-1971, 53 y.o., male Today's Date: 05/07/2024  END OF SESSION:  PT End of Session - 05/07/24 1032     Visit Number 14    Date for Recertification  05/18/24    Authorization Type BCBS    Authorization Time Period [employer] CARITAS AUTH REQ AT 27 VISITS $4 COPAY    Authorization - Visit Number 14    Authorization - Number of Visits 27    PT Start Time 0933    PT Stop Time 1027    PT Time Calculation (min) 54 min    Activity Tolerance Patient tolerated treatment well    Behavior During Therapy WFL for tasks assessed/performed             Past Medical History:  Diagnosis Date   ADHD (attention deficit hyperactivity disorder)    Anxiety    Arthritis    Arthrofibrosis of knee joint, left    post op TKA    History of Graves' disease 2008   s/p  RAI  same year , on medication for a year then stablized no meds since  (per pt followed by pcp)   Hyperlipidemia    Hypertension    followed by pcp   (in care everywhere pt had stress echo, 03-26-2016, negative for ishemia and echo normal)   Hyperthyroidism    grave's disease   Pre-diabetes    Right ventricular dysfunction    per [facility] 02-20-24   Thyroid  disease    Wears glasses    Past Surgical History:  Procedure Laterality Date   ANTERIOR CRUCIATE LIGAMENT REPAIR Left 1990S   HERNIA REPAIR     JOINT REPLACEMENT  05/06/2020   KNEE ARTHROSCOPY W/ MEDIAL COLLATERAL LIGAMENT (MCL) REPAIR Left 1990s   KNEE ARTHROSCOPY W/ MENISCAL REPAIR Left 1990s   KNEE CLOSED REDUCTION Left 10/21/2020   Procedure: CLOSED MANIPULATION LEFT KNEE;  Surgeon: Josefina Chew, MD;  Location: [facility] [place];  Service: Orthopedics;  Laterality: Left;   LIPOMA EXCISION     LEF SHOULDER AREA   PARTIAL KNEE ARTHROPLASTY Right 03/21/2024   Procedure: ARTHROPLASTY, KNEE, UNICOMPARTMENTAL;  Surgeon: Edna Toribio LABOR, MD;   Location: [facility];  Service: Orthopedics;  Laterality: Right;   RIGHT HEART CATH N/A 02/20/2024   Procedure: RIGHT HEART CATH;  Surgeon: Mady Bruckner, MD;  Location: [facility];  Service: Cardiovascular;  Laterality: N/A;   TOTAL KNEE ARTHROPLASTY Left 05/06/2020   Procedure: TOTAL KNEE ARTHROPLASTY;  Surgeon: Josefina Chew, MD;  Location: [facility];  Service: Orthopedics;  Laterality: Left;   UMBILICAL HERNIA REPAIR  07-17-2015  @[email or web address]    AND RIGHT ABDOMINAL WALL EXCISION LIPOMA   Patient Active Problem List   Diagnosis Date Noted   Encounter for general adult medical examination w/o abnormal findings 04/16/2024   H/O right knee surgery 04/09/2024   Abnormal EKG 01/30/2024   Need for influenza vaccination 01/30/2024   Pre-op exam 01/30/2024   Class 1 obesity due to excess calories with serious comorbidity and body mass index (BMI) of 32.0 to 32.9 in adult 08/30/2023   Seasonal allergies 08/30/2023   Acute cough 08/30/2023   Rash and nonspecific skin eruption 04/13/2023   Encounter for annual health examination 04/07/2023   COVID-19 vaccination declined 04/07/2023   Influenza vaccination declined 04/07/2023   Herpes zoster vaccination declined 04/07/2023   Encounter for prostate cancer screening 04/07/2023   Impacted cerumen of right  ear 04/07/2023   Arthritis 09/01/2022   Prediabetes 05/03/2022   Class 1 obesity due to excess calories with serious comorbidity and body mass index (BMI) of 31.0 to 31.9 in adult 05/03/2022   Complication associated with orthopedic device 02/10/2022   Mood disorder 08/18/2021   Osteoarthritis of left knee 06/04/2021   S/P total knee arthroplasty, left 05/06/2020   Eczema 04/17/2019   Mixed hyperlipidemia 08/02/2018   Numbness and tingling in both hands 08/02/2018   Essential hypertension 07/24/2018   H/O Graves' disease 07/24/2018   Attention deficit disorder 07/24/2018   Dyslipidemia 03/26/2016   Family history of premature CAD  03/26/2016   Tobacco abuse 08/13/2014   GERD without esophagitis 08/13/2014   Lumbar herniated disc 08/13/2014    PCP: Georgina Speaks, FNP  REFERRING PROVIDER: Edna Toribio LABOR, MD  REFERRING DIAG: S/P right medial unicompartmental knee arthroplasty on 03/21/24  THERAPY DIAG:  Right knee pain, unspecified chronicity  Difficulty in walking, not elsewhere classified  Muscle weakness (generalized)  Other abnormalities of gait and mobility  Localized edema  Rationale for Evaluation and Treatment: Rehabilitation  ONSET DATE: S/P right medial unicompartmental knee arthroplasty on 03/21/24  SUBJECTIVE:   SUBJECTIVE STATEMENT: Patient reports his knee feels stiff today.  PERTINENT HISTORY: S/P right medial unicompartmental knee arthroplasty on 03/21/24 HTN, Graves Disease, OA, Left Knee TKA 05/06/2020   PAIN:  Are you having pain? Yes: NPRS scale: 5/10 Pain location: right medial knee Pain description: sore Aggravating factors: sudden steps when walking Relieving factors: cold packs  PRECAUTIONS: None  RED FLAGS: None   WEIGHT BEARING RESTRICTIONS: No  FALLS:  Has patient fallen in last 6 months? 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  NEXT MD VISIT: Dr Edna on 04/05/2024  OBJECTIVE:  Note: Objective measures were completed at Evaluation unless otherwise noted.  DIAGNOSTIC FINDINGS:   Right knee radiograph on 03/21/2024: IMPRESSION: 1. Medial hemiarthroplasty in expected alignment. No immediate complications.  PATIENT SURVEYS:  LEFS  Extreme difficulty/unable (0), Quite a bit of  difficulty (1), Moderate difficulty (2), Little difficulty (3), No difficulty (4) Survey date:  03/26/24 04/16/2024  Any of your usual work, housework or school activities 0 2  2. Usual hobbies, recreational or sporting activities 0 1  3. Getting into/out of the bath 0 2  4. Walking between rooms 2 2  5. Putting on socks/shoes 1 0  6. Squatting  0  3  7. Lifting an object, like a bag of groceries from the floor 0 2  8. Performing light activities around your home 1 2  9. Performing heavy activities around your home 0 0  10. Getting into/out of a car 0 3  11. Walking 2 blocks 0 3  12. Walking 1 mile 0 3  13. Going up/down 10 stairs (1 flight) 0 3  14. Standing for 1 hour 0 0  15.  sitting for 1 hour 1 3  16. Running on even ground 0 0  17. Running on uneven ground 0 0  18. Making sharp turns while running fast 0 0  19. Hopping  0 3  20.  Rolling over in bed 1 4  Score total:  6/80 = 7.5% 36/80 45%     COGNITION: Overall cognitive status: Within functional limits for tasks assessed     SENSATION: Patient states that the numbness has gone away, but he is still having some itching  EDEMA:  Circumferential: Right- 44.5 cm, Left- 43 cm  MUSCLE LENGTH: Hamstrings: tightness bilat   PALPATION: Minimal tender to palpation around his knee  LOWER EXTREMITY ROM:  Active ROM Right eval Right 04/24/24 Right 05/02/24 Left eval  Knee flexion 90 112 120 107  Knee extension -20 0 0 0   (Blank rows = not tested)  LOWER EXTREMITY MMT:  Eval: Left LE strength:  5-/5 Right hip strength is 4/5 Right knee strength of 3- to 3/5   FUNCTIONAL TESTS:  Eval: 5 times sit to stand: 26.23 sec with poor eccentric muscle control with stand to sit Timed up and go (TUG): 17.77 sec without AD, 12.72 sec with RW  04/18/2024: 6 min walk test:  1,361 ft with RPE of 5/10 and notable antalgic gait towards end of ambulation (age related norms 2,110 ft) 5 times sit to/from stand: 11.698 sec Timed  up and go (TUG): 7.47 sec without AD  GAIT: Distance walked: >50 ft Assistive device utilized: Environmental Consultant - 2 wheeled Level of assistance: Modified independence Comments: Antalgic                                                                                                                                 TREATMENT DATE:  05/07/2024: Recumbent bike level 3 x 6 min with PT present to discuss status Standing hamstring stretch at stairs 2x20 sec bilat Calf stretch with rockerboard 2 x 20sec  Hip flexor stretching/ rocking at stair x 10 with hold bilateral  Seated long arc quad with 7.5# 2x10 bilat Staggered sit to stand 20#  x 10 bilateral  Forward T at barre 2x10 bilat Seated hamstring curl with blue tband 2x10 bilat Lunge to BOSU (forwards & sideways) x10 each no UE support Game ready to right knee x10 min, medium compression, x10 min   05/04/2024: Recumbent bike level 3 x 3 min with PT present to discuss status Standing hamstring stretch at stairs 2x20 sec bilat Manual:  addaday to right calf, hamstring, and glute secondary to patient reporting increased pain and tightness.  Pt reports decreased pain and relaxation after Seated long arc quad with 7.5# 2x10 bilat Seated hamstring curl with blue tband 2x10 bilat Forward T at barre 2x10 bilat Game ready to right knee x10 min, medium compression, x10 min   05/02/2024: Nustep level 6 (LE only) x3 min with PT present to discuss status Standing hamstring stretch at stairs 2x20 sec bilat Recumbent bike level 3 x 3 min with PT present to discuss status Hip Matrix 40#:  hip abduction, hip extension.  2x10 each bilat Seated long arc quad with 7.5# 2x10 bilat Seated hamstring  curl with blue tband 2x10 bilat Forward T at barre 2x10 bilat Standing knee flexion with foot on PT mat performing x10 slow pulses bilat Standing rocker board for DF/PF x1 min   04/30/2024: Recumbent bike level 3 x 6 min with PT present to discuss status Squat  taps to mat with 15# kettlebell 2x10 Single leg dead lift (alt toe in back for balance) with 15# kettlebell 2 x 10 bilat Seated hamstring stretch 2x20 sec bilat Standing knee flexion stretch with foot on mat x10 slow pulses bilat Leg Press (seat at 9) 130# 2x10 Hip Matrix 40#:  hip abduction, hip extension.  2x10 each bilat Seated long arc quad with 7.5# 2x10 bilat    PATIENT EDUCATION:  Education details: Issued HEP Person educated: Patient Education method: Explanation, Demonstration, and Handouts Education comprehension: verbalized understanding  HOME EXERCISE PROGRAM: Access Code: [email or web address] URL: [email or web address] Date: 03/28/2024 Prepared by: Delon Haddock  Exercises - Active Straight Leg Raise with Quad Set  - 1 x daily - 7 x weekly - 2 sets - 10 reps - Supine Heel Slide  - 1 x daily - 7 x weekly - 2 sets - 10 reps - Sit to Stand  - 1 x daily - 7 x weekly - 2 sets - 5 reps - Seated Long Arc Quad  - 1 x daily - 7 x weekly - 2 sets - 10 reps - Seated Heel Slide  - 1 x daily - 7 x weekly - 2 sets - 10 reps - Seated Quad Set  - 1 x daily - 7 x weekly - 2 sets - 10 reps - Standing Hip Abduction with Counter Support  - 1 x daily - 7 x weekly - 2 sets - 10 reps - Heel Raises with Counter Support  - 1 x daily - 7 x weekly - 2 sets - 10 reps - Standing March with Counter Support  - 1 x daily - 7 x weekly - 2 sets - 10 reps - Standing Knee Flexion with Unilateral Counter Support  - 1 x daily - 7 x weekly - 2 sets - 10 reps - Supine Hip Internal and External Rotation  - 1 x daily - 7 x weekly - 1 sets - 10 reps - Supine Hip Abduction  - 1 x daily - 7 x weekly - 2 sets - 10 reps  ASSESSMENT:  CLINICAL IMPRESSION: Mr Szumski presents to skilled PT reporting increased knee stiffness after going to an [employer] game and riding in a car for four hours. Incorporated more gentle mobility and stretching at the beginning of the session. PT provided cues for correct performance of  forward T exercise. Incorporated strengthening on BOSU today for balance and stability. Overall, patient is progressing well post surgery and would benefit from continued killed PT to progress towards goal related activities.  OBJECTIVE IMPAIRMENTS: Abnormal gait, decreased activity tolerance, decreased balance, difficulty walking, decreased ROM, decreased strength, increased muscle spasms, impaired flexibility, and pain.   ACTIVITY LIMITATIONS: carrying, lifting, bending, standing, squatting, stairs, bed mobility, and locomotion level  PARTICIPATION LIMITATIONS: cleaning, community activity, and occupation  PERSONAL FACTORS: Time since onset of injury/illness/exacerbation and 3+ comorbidities: OA, s/p L THA, Graves Disease are also affecting patient's functional outcome.   REHAB POTENTIAL: Good  CLINICAL DECISION MAKING: Evolving/moderate complexity  EVALUATION COMPLEXITY: Moderate   GOALS: Goals reviewed with patient? Yes  SHORT TERM GOALS: Target date: 04/16/2024 Patient will be independent with initial HEP.  Baseline: Goal status: MET 03/28/24  2.  Patient to participate in 6 minute walk test to establish a baseline.  Baseline:  Goal status: Met on 04/18/24  3.  Patient will improve right knee A/ROM to 10 to 95 degrees to allow for improved gait pattern. Baseline:  Goal status: MET 04/04/24   LONG TERM GOALS: Target date: 05/18/2024  Patient will be independent with advanced HEP to promote continued exercises post discharge.  Baseline:  Goal status: Ongoing  2.  Patient will increase Lower Extremity Functional Scale to at least 50% to demonstrate increased independence with functional activities.  Baseline: 7.5% Goal status: Ongoing (see above)  3.  Patient will report ability to drive his car without increased pain.  Baseline:  Goal status: Ongoing  4.  Patient will report ability to start a walking/jogging exercise program without increased pain.  Baseline:  Goal  status: Ongoing  5.  Patient will increase his strength to [facility] to allow him to navigate stairs with reciprocal pattern.  Baseline:  Goal status: Ongoing  6.  Patient will increase knee A/ROM to 5 to 115 degrees to allow him to demonstrate skills for basketball practices. Baseline:  Goal status: Ongoing (see above)   PLAN:  PT FREQUENCY: 2-3x/week  PT DURATION: 8 weeks  PLANNED INTERVENTIONS: 97164- PT Re-evaluation, 97750- Physical Performance Testing, 97110-Therapeutic exercises, 97530- Therapeutic activity, 97112- Neuromuscular re-education, 97535- Self Care, 02859- Manual therapy, [phone]- Gait training, [phone]- Canalith repositioning, [id number]- Aquatic Therapy, [phone]- Electrical stimulation (unattended), [phone]- Electrical stimulation (manual), [id number]- Vasopneumatic device, [id number]- Ultrasound, [id number]- Ionotophoresis 4mg /ml Dexamethasone , 79439 (1-2 muscles), [id number] (3+ muscles)- Dry Needling, Patient/Family education, Balance training, Stair training, Taping, Joint mobilization, Joint manipulation, Scar mobilization, Vestibular training, DME instructions, Cryotherapy, and Moist heat  PLAN FOR NEXT SESSION: functional strengthening, balance, activity tolerance     Kristeen Sar, PT, DPT 05/07/2024 10:34 AM [facility] [address] [place], [place] [address] Phone # [phone] Fax [phone]  "

## 2024-05-09 ENCOUNTER — Ambulatory Visit: Admitting: Physical Therapy

## 2024-05-09 ENCOUNTER — Encounter: Payer: Self-pay | Admitting: Physical Therapy

## 2024-05-09 DIAGNOSIS — R600 Localized edema: Secondary | ICD-10-CM

## 2024-05-09 DIAGNOSIS — R2689 Other abnormalities of gait and mobility: Secondary | ICD-10-CM | POA: Diagnosis not present

## 2024-05-09 DIAGNOSIS — R262 Difficulty in walking, not elsewhere classified: Secondary | ICD-10-CM | POA: Diagnosis not present

## 2024-05-09 DIAGNOSIS — M25661 Stiffness of right knee, not elsewhere classified: Secondary | ICD-10-CM | POA: Diagnosis not present

## 2024-05-09 DIAGNOSIS — M6281 Muscle weakness (generalized): Secondary | ICD-10-CM

## 2024-05-09 DIAGNOSIS — M25561 Pain in right knee: Secondary | ICD-10-CM | POA: Diagnosis not present

## 2024-05-09 DIAGNOSIS — R252 Cramp and spasm: Secondary | ICD-10-CM | POA: Diagnosis not present

## 2024-05-09 NOTE — Therapy (Signed)
 " OUTPATIENT PHYSICAL THERAPY TREATMENT NOTE   Patient Name: Steven Gibson MRN: [id number] DOB:13-Oct-1970, 53 y.o., male Today's Date: 05/09/2024  END OF SESSION:  PT End of Session - 05/09/24 1012     Visit Number 15    Date for Recertification  05/18/24    Authorization Type BCBS    Authorization Time Period [employer] CARIT[employer] AUTH REQ AT 27 VISITS $4 COPAY    Authorization - Visit Number 15    Authorization - Number of Visits 27    PT Start Time 0945   arrival time   PT Stop Time 1011    PT Time Calculation (min) 26 min    Activity Tolerance Patient tolerated treatment well    Behavior During Therapy WFL for tasks assessed/performed              Past Medical History:  Diagnosis Date   ADHD (attention deficit hyperactivity disorder)    Anxiety    Arthritis    Arthrofibrosis of knee joint, left    post op TKA    History of Graves' disease 2008   s/p  RAI  same year , on medication for a year then stablized no meds since  (per pt followed by pcp)   Hyperlipidemia    Hypertension    followed by pcp   (in care everywhere pt had stress echo, 03-26-2016, negative for ishemia and echo normal)   Hyperthyroidism    grave's disease   Pre-diabetes    Right ventricular dysfunction    per [facility] 02-20-24   Thyroid  disease    Wears glasses    Past Surgical History:  Procedure Laterality Date   ANTERIOR CRUCIATE LIGAMENT REPAIR Left 1990S   HERNIA REPAIR     JOINT REPLACEMENT  05/06/2020   KNEE ARTHROSCOPY W/ MEDIAL COLLATERAL LIGAMENT (MCL) REPAIR Left 1990s   KNEE ARTHROSCOPY W/ MENISCAL REPAIR Left 1990s   KNEE CLOSED REDUCTION Left 10/21/2020   Procedure: CLOSED MANIPULATION LEFT KNEE;  Surgeon: Josefina Chew, MD;  Location: [facility];  Service: Orthopedics;  Laterality: Left;   LIPOMA EXCISION     LEF SHOULDER AREA   PARTIAL KNEE ARTHROPLASTY Right 03/21/2024   Procedure: ARTHROPLASTY, KNEE, UNICOMPARTMENTAL;  Surgeon: Edna Toribio LABOR, MD;  Location: [facility];  Service: Orthopedics;  Laterality: Right;   RIGHT HEART CATH N/A 02/20/2024   Procedure: RIGHT HEART CATH;  Surgeon: Mady Bruckner, MD;  Location: [facility];  Service: Cardiovascular;  Laterality: N/A;   TOTAL KNEE ARTHROPLASTY Left 05/06/2020   Procedure: TOTAL KNEE ARTHROPLASTY;  Surgeon: Josefina Chew, MD;  Location: [facility];  Service: Orthopedics;  Laterality: Left;   UMBILICAL HERNIA REPAIR  07-17-2015  @[email or web address]    AND RIGHT ABDOMINAL WALL EXCISION LIPOMA   Patient Active Problem List   Diagnosis Date Noted   Encounter for general adult medical examination w/o abnormal findings 04/16/2024   H/O right knee surgery 04/09/2024   Abnormal EKG 01/30/2024   Need for influenza vaccination 01/30/2024   Pre-op exam 01/30/2024   Class 1 obesity due to excess calories with serious comorbidity and body mass index (BMI) of 32.0 to 32.9 in adult 08/30/2023   Seasonal allergies 08/30/2023   Acute cough 08/30/2023   Rash and nonspecific skin eruption 04/13/2023   Encounter for annual health examination 04/07/2023   COVID-19 vaccination declined 04/07/2023   Influenza vaccination declined 04/07/2023   Herpes zoster vaccination declined 04/07/2023   Encounter for prostate cancer screening 04/07/2023  Impacted cerumen of right ear 04/07/2023   Arthritis 09/01/2022   Prediabetes 05/03/2022   Class 1 obesity due to excess calories with serious comorbidity and body mass index (BMI) of 31.0 to 31.9 in adult 05/03/2022   Complication associated with orthopedic device 02/10/2022   Mood disorder 08/18/2021   Osteoarthritis of left knee 06/04/2021   S/P total knee arthroplasty, left 05/06/2020   Eczema 04/17/2019   Mixed hyperlipidemia 08/02/2018   Numbness and tingling in both hands 08/02/2018   Essential hypertension 07/24/2018   H/O Graves' disease 07/24/2018   Attention deficit disorder 07/24/2018   Dyslipidemia 03/26/2016   Family history of  premature CAD 03/26/2016   Tobacco abuse 08/13/2014   GERD without esophagitis 08/13/2014   Lumbar herniated disc 08/13/2014    PCP: Georgina Speaks, FNP  REFERRING PROVIDER: Edna Toribio LABOR, MD  REFERRING DIAG: S/P right medial unicompartmental knee arthroplasty on 03/21/24  THERAPY DIAG:  Right knee pain, unspecified chronicity  Difficulty in walking, not elsewhere classified  Muscle weakness (generalized)  Other abnormalities of gait and mobility  Localized edema  Rationale for Evaluation and Treatment: Rehabilitation  ONSET DATE: S/P right medial unicompartmental knee arthroplasty on 03/21/24  SUBJECTIVE:   SUBJECTIVE STATEMENT: Patient reports his knee feels a little stiff today.  PERTINENT HISTORY: S/P right medial unicompartmental knee arthroplasty on 03/21/24 HTN, Graves Disease, OA, Left Knee TKA 05/06/2020   PAIN:  Are you having pain? Yes: NPRS scale: 5/10 Pain location: right medial knee Pain description: sore Aggravating factors: sudden steps when walking Relieving factors: cold packs  PRECAUTIONS: None  RED FLAGS: None   WEIGHT BEARING RESTRICTIONS: No  FALLS:  Has patient fallen in last 6 months? 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  NEXT MD VISIT: Dr Edna on 04/05/2024  OBJECTIVE:  Note: Objective measures were completed at Evaluation unless otherwise noted.  DIAGNOSTIC FINDINGS:   Right knee radiograph on 03/21/2024: IMPRESSION: 1. Medial hemiarthroplasty in expected alignment. No immediate complications.  PATIENT SURVEYS:  LEFS  Extreme difficulty/unable  (0), Quite a bit of difficulty (1), Moderate difficulty (2), Little difficulty (3), No difficulty (4) Survey date:  03/26/24 04/16/2024  Any of your usual work, housework or school activities 0 2  2. Usual hobbies, recreational or sporting activities 0 1  3. Getting into/out of the bath 0 2  4. Walking between rooms 2 2  5. Putting on socks/shoes 1 0  6. Squatting  0  3  7. Lifting an object, like a bag of groceries from the floor 0 2  8. Performing light activities around your home 1 2  9. Performing heavy activities around your home 0 0  10. Getting into/out of a car 0 3  11. Walking 2 blocks 0 3  12. Walking 1 mile 0 3  13. Going up/down 10 stairs (1 flight) 0 3  14. Standing for 1 hour 0 0  15.  sitting for 1 hour 1 3  16. Running on even ground 0 0  17. Running on uneven ground 0 0  18. Making sharp turns while running fast 0 0  19.  Hopping  0 3  20. Rolling over in bed 1 4  Score total:  6/80 = 7.5% 36/80 45%     COGNITION: Overall cognitive status: Within functional limits for tasks assessed     SENSATION: Patient states that the numbness has gone away, but he is still having some itching  EDEMA:  Circumferential: Right- 44.5 cm, Left- 43 cm  MUSCLE LENGTH: Hamstrings: tightness bilat   PALPATION: Minimal tender to palpation around his knee  LOWER EXTREMITY ROM:  Active ROM Right eval Right 04/24/24 Right 05/02/24 Left eval  Knee flexion 90 112 120 107  Knee extension -20 0 0 0   (Blank rows = not tested)  LOWER EXTREMITY MMT:  Eval: Left LE strength:  5-/5 Right hip strength is 4/5 Right knee strength of 3- to 3/5   FUNCTIONAL TESTS:  Eval: 5 times sit to stand: 26.23 sec with poor eccentric muscle control with stand to sit Timed up and go (TUG): 17.77 sec without AD, 12.72 sec with RW  04/18/2024: 6 min walk test:  1,361 ft with RPE of 5/10 and notable antalgic gait towards end of ambulation (age related norms 2,110 ft) 5 times sit to/from  stand: 11.698 sec Timed up and go (TUG): 7.47 sec without AD  GAIT: Distance walked: >50 ft Assistive device utilized: Environmental Consultant - 2 wheeled Level of assistance: Modified independence Comments: Antalgic                                                                                                                                 TREATMENT DATE:  05/09/2024: Patient was late to appointment  Recumbent bike level 3 x 6 min with PT present to discuss status Hip flexor stretching/ rocking at stair x 10 with hold bilateral  Rockerboard (DF/PF) x 2 mins Leg Press (seat at 9) 135# 2x10 Hip Matrix 45#:  hip abduction, hip extension, hip flexion.  2x10 each bilat Forward T at barre 2x10 bilat    05/07/2024: Recumbent bike level 3 x 6 min with PT present to discuss status Standing hamstring stretch at stairs 2x20 sec bilat Calf stretch with rockerboard 2 x 20sec  Hip flexor stretching/ rocking at stair x 10 with hold bilateral  Seated long arc quad with 7.5# 2x10 bilat Staggered sit to stand 20#  x 10 bilateral  Forward T at barre 2x10 bilat Seated hamstring curl with blue tband 2x10 bilat Lunge to BOSU (forwards & sideways) x10 each no UE support Game ready to right knee x10 min, medium compression, x10 min   05/04/2024: Recumbent bike level 3 x 3 min with PT present to discuss status Standing hamstring stretch at stairs 2x20 sec bilat Manual:  addaday to right calf, hamstring, and glute secondary to patient reporting increased pain and tightness.  Pt reports decreased pain and relaxation after Seated long arc quad with 7.5# 2x10 bilat Seated hamstring curl with blue tband 2x10 bilat Forward T at  barre 2x10 bilat Game ready to right knee x10 min, medium compression, x10 min   05/02/2024: Nustep level 6 (LE only) x3 min with PT present to discuss status Standing hamstring stretch at stairs 2x20 sec bilat Recumbent bike level 3 x 3 min with PT present to discuss status Hip Matrix  40#:  hip abduction, hip extension.  2x10 each bilat Seated long arc quad with 7.5# 2x10 bilat Seated hamstring curl with blue tband 2x10 bilat Forward T at barre 2x10 bilat Standing knee flexion with foot on PT mat performing x10 slow pulses bilat Standing rocker board for DF/PF x1 min     PATIENT EDUCATION:  Education details: Issued HEP Person educated: Patient Education method: Explanation, Facilities Manager, and Handouts Education comprehension: verbalized understanding  HOME EXERCISE PROGRAM: Access Code: [email or web address] URL: [email or web address] Date: 03/28/2024 Prepared by: Delon Haddock  Exercises - Active Straight Leg Raise with Quad Set  - 1 x daily - 7 x weekly - 2 sets - 10 reps - Supine Heel Slide  - 1 x daily - 7 x weekly - 2 sets - 10 reps - Sit to Stand  - 1 x daily - 7 x weekly - 2 sets - 5 reps - Seated Long Arc Quad  - 1 x daily - 7 x weekly - 2 sets - 10 reps - Seated Heel Slide  - 1 x daily - 7 x weekly - 2 sets - 10 reps - Seated Quad Set  - 1 x daily - 7 x weekly - 2 sets - 10 reps - Standing Hip Abduction with Counter Support  - 1 x daily - 7 x weekly - 2 sets - 10 reps - Heel Raises with Counter Support  - 1 x daily - 7 x weekly - 2 sets - 10 reps - Standing March with Counter Support  - 1 x daily - 7 x weekly - 2 sets - 10 reps - Standing Knee Flexion with Unilateral Counter Support  - 1 x daily - 7 x weekly - 2 sets - 10 reps - Supine Hip Internal and External Rotation  - 1 x daily - 7 x weekly - 1 sets - 10 reps - Supine Hip Abduction  - 1 x daily - 7 x weekly - 2 sets - 10 reps  ASSESSMENT:  CLINICAL IMPRESSION: Mr Wares continues to verbalized increased knee stiffness.  He responds well to knee mobility exercises at beginning of session. He tolerated strength progressions well and did not verbalize any pain or discomfort. PT monitored patient response throughout and provided cues as needed.  Patient demonstrates good rehab potential to  achieve stated goals through skilled therapy intervention.     OBJECTIVE IMPAIRMENTS: Abnormal gait, decreased activity tolerance, decreased balance, difficulty walking, decreased ROM, decreased strength, increased muscle spasms, impaired flexibility, and pain.   ACTIVITY LIMITATIONS: carrying, lifting, bending, standing, squatting, stairs, bed mobility, and locomotion level  PARTICIPATION LIMITATIONS: cleaning, community activity, and occupation  PERSONAL FACTORS: Time since onset of injury/illness/exacerbation and 3+ comorbidities: OA, s/p L THA, Graves Disease are also affecting patient's functional outcome.   REHAB POTENTIAL: Good  CLINICAL DECISION MAKING: Evolving/moderate complexity  EVALUATION COMPLEXITY: Moderate   GOALS: Goals reviewed with patient? Yes  SHORT TERM GOALS: Target date: 04/16/2024 Patient will be independent with initial HEP.  Baseline: Goal status: MET 03/28/24  2.  Patient to participate in 6 minute walk test to establish a baseline.  Baseline:  Goal status: Met on 04/18/24  3.  Patient will  improve right knee A/ROM to 10 to 95 degrees to allow for improved gait pattern. Baseline:  Goal status: MET 04/04/24   LONG TERM GOALS: Target date: 05/18/2024  Patient will be independent with advanced HEP to promote continued exercises post discharge.  Baseline:  Goal status: Ongoing  2.  Patient will increase Lower Extremity Functional Scale to at least 50% to demonstrate increased independence with functional activities.  Baseline: 7.5% Goal status: Ongoing (see above)  3.  Patient will report ability to drive his car without increased pain.  Baseline:  Goal status: Ongoing  4.  Patient will report ability to start a walking/jogging exercise program without increased pain.  Baseline:  Goal status: Ongoing  5.  Patient will increase his strength to [facility] to allow him to navigate stairs with reciprocal pattern.  Baseline:  Goal status: Ongoing  6.   Patient will increase knee A/ROM to 5 to 115 degrees to allow him to demonstrate skills for basketball practices. Baseline:  Goal status: Ongoing (see above)   PLAN:  PT FREQUENCY: 2-3x/week  PT DURATION: 8 weeks  PLANNED INTERVENTIONS: 97164- PT Re-evaluation, 97750- Physical Performance Testing, 97110-Therapeutic exercises, 97530- Therapeutic activity, 97112- Neuromuscular re-education, 97535- Self Care, 02859- Manual therapy, [phone]- Gait training, [phone]- Canalith repositioning, [id number]- Aquatic Therapy, [phone]- Electrical stimulation (unattended), [phone]- Electrical stimulation (manual), [id number]- Vasopneumatic device, [id number]- Ultrasound, [id number]- Ionotophoresis 4mg /ml Dexamethasone , 79439 (1-2 muscles), [id number] (3+ muscles)- Dry Needling, Patient/Family education, Balance training, Stair training, Taping, Joint mobilization, Joint manipulation, Scar mobilization, Vestibular training, DME instructions, Cryotherapy, and Moist heat  PLAN FOR NEXT SESSION: functional strengthening, balance, activity tolerance     Kristeen Sar, PT, DPT 05/09/2024 10:13 AM [facility] [address] [place], [place] [address] Phone # [phone] Fax [phone]  "

## 2024-05-11 ENCOUNTER — Other Ambulatory Visit: Payer: Self-pay

## 2024-05-11 DIAGNOSIS — I10 Essential (primary) hypertension: Secondary | ICD-10-CM

## 2024-05-11 MED ORDER — LISINOPRIL-HYDROCHLOROTHIAZIDE 20-25 MG PO TABS: 1.0000 | ORAL_TABLET | Freq: Every day | ORAL | 1 refills | Status: DC

## 2024-05-14 ENCOUNTER — Ambulatory Visit: Admitting: Rehabilitative and Restorative Service Providers"

## 2024-05-14 ENCOUNTER — Encounter: Payer: Self-pay | Admitting: Rehabilitative and Restorative Service Providers"

## 2024-05-14 DIAGNOSIS — R262 Difficulty in walking, not elsewhere classified: Secondary | ICD-10-CM

## 2024-05-14 DIAGNOSIS — M25561 Pain in right knee: Secondary | ICD-10-CM | POA: Diagnosis not present

## 2024-05-14 DIAGNOSIS — R600 Localized edema: Secondary | ICD-10-CM

## 2024-05-14 DIAGNOSIS — M6281 Muscle weakness (generalized): Secondary | ICD-10-CM | POA: Diagnosis not present

## 2024-05-14 DIAGNOSIS — M25661 Stiffness of right knee, not elsewhere classified: Secondary | ICD-10-CM | POA: Diagnosis not present

## 2024-05-14 DIAGNOSIS — R2689 Other abnormalities of gait and mobility: Secondary | ICD-10-CM | POA: Diagnosis not present

## 2024-05-14 DIAGNOSIS — R252 Cramp and spasm: Secondary | ICD-10-CM | POA: Diagnosis not present

## 2024-05-14 NOTE — Therapy (Signed)
 " OUTPATIENT PHYSICAL THERAPY TREATMENT NOTE   Patient Name: Steven Gibson MRN: [id number] DOB:10-Sep-1970, 53 y.o., male Today's Date: 05/14/2024  END OF SESSION:  PT End of Session - 05/14/24 0934     Visit Number 16    Date for Recertification  05/18/24    Authorization Time Period [employer] CARITAS AUTH REQ AT 27 VISITS $4 COPAY    Authorization - Visit Number 16    Authorization - Number of Visits 27    PT Start Time 0930    PT Stop Time 1010    PT Time Calculation (min) 40 min    Activity Tolerance Patient tolerated treatment well    Behavior During Therapy WFL for tasks assessed/performed              Past Medical History:  Diagnosis Date   ADHD (attention deficit hyperactivity disorder)    Anxiety    Arthritis    Arthrofibrosis of knee joint, left    post op TKA    History of Graves' disease 2008   s/p  RAI  same year , on medication for a year then stablized no meds since  (per pt followed by pcp)   Hyperlipidemia    Hypertension    followed by pcp   (in care everywhere pt had stress echo, 03-26-2016, negative for ishemia and echo normal)   Hyperthyroidism    grave's disease   Pre-diabetes    Right ventricular dysfunction    per [facility] 02-20-24   Thyroid  disease    Wears glasses    Past Surgical History:  Procedure Laterality Date   ANTERIOR CRUCIATE LIGAMENT REPAIR Left 1990S   HERNIA REPAIR     JOINT REPLACEMENT  05/06/2020   KNEE ARTHROSCOPY W/ MEDIAL COLLATERAL LIGAMENT (MCL) REPAIR Left 1990s   KNEE ARTHROSCOPY W/ MENISCAL REPAIR Left 1990s   KNEE CLOSED REDUCTION Left 10/21/2020   Procedure: CLOSED MANIPULATION LEFT KNEE;  Surgeon: Josefina Chew, MD;  Location: [facility] [place];  Service: Orthopedics;  Laterality: Left;   LIPOMA EXCISION     LEF SHOULDER AREA   PARTIAL KNEE ARTHROPLASTY Right 03/21/2024   Procedure: ARTHROPLASTY, KNEE, UNICOMPARTMENTAL;  Surgeon: Edna Toribio LABOR, MD;  Location: [facility];  Service:  Orthopedics;  Laterality: Right;   RIGHT HEART CATH N/A 02/20/2024   Procedure: RIGHT HEART CATH;  Surgeon: Mady Bruckner, MD;  Location: [facility];  Service: Cardiovascular;  Laterality: N/A;   TOTAL KNEE ARTHROPLASTY Left 05/06/2020   Procedure: TOTAL KNEE ARTHROPLASTY;  Surgeon: Josefina Chew, MD;  Location: [facility];  Service: Orthopedics;  Laterality: Left;   UMBILICAL HERNIA REPAIR  07-17-2015  @[email or web address]    AND RIGHT ABDOMINAL WALL EXCISION LIPOMA   Patient Active Problem List   Diagnosis Date Noted   Encounter for general adult medical examination w/o abnormal findings 04/16/2024   H/O right knee surgery 04/09/2024   Abnormal EKG 01/30/2024   Need for influenza vaccination 01/30/2024   Pre-op exam 01/30/2024   Class 1 obesity due to excess calories with serious comorbidity and body mass index (BMI) of 32.0 to 32.9 in adult 08/30/2023   Seasonal allergies 08/30/2023   Acute cough 08/30/2023   Rash and nonspecific skin eruption 04/13/2023   Encounter for annual health examination 04/07/2023   COVID-19 vaccination declined 04/07/2023   Influenza vaccination declined 04/07/2023   Herpes zoster vaccination declined 04/07/2023   Encounter for prostate cancer screening 04/07/2023   Impacted cerumen of right ear 04/07/2023   Arthritis  09/01/2022   Prediabetes 05/03/2022   Class 1 obesity due to excess calories with serious comorbidity and body mass index (BMI) of 31.0 to 31.9 in adult 05/03/2022   Complication associated with orthopedic device 02/10/2022   Mood disorder 08/18/2021   Osteoarthritis of left knee 06/04/2021   S/P total knee arthroplasty, left 05/06/2020   Eczema 04/17/2019   Mixed hyperlipidemia 08/02/2018   Numbness and tingling in both hands 08/02/2018   Essential hypertension 07/24/2018   H/O Graves' disease 07/24/2018   Attention deficit disorder 07/24/2018   Dyslipidemia 03/26/2016   Family history of premature CAD 03/26/2016   Tobacco abuse  08/13/2014   GERD without esophagitis 08/13/2014   Lumbar herniated disc 08/13/2014    PCP: Georgina Speaks, FNP  REFERRING PROVIDER: Edna Toribio LABOR, MD  REFERRING DIAG: S/P right medial unicompartmental knee arthroplasty on 03/21/24  THERAPY DIAG:  Right knee pain, unspecified chronicity  Difficulty in walking, not elsewhere classified  Muscle weakness (generalized)  Other abnormalities of gait and mobility  Localized edema  Rationale for Evaluation and Treatment: Rehabilitation  ONSET DATE: S/P right medial unicompartmental knee arthroplasty on 03/21/24  SUBJECTIVE:   SUBJECTIVE STATEMENT: Patient denies pain, but states that he is having some tightness.  PERTINENT HISTORY: S/P right medial unicompartmental knee arthroplasty on 03/21/24 HTN, Graves Disease, OA, Left Knee TKA 05/06/2020   PAIN:  Are you having pain? Yes: NPRS scale: 0/10 Pain location: right medial knee Pain description: sore Aggravating factors: sudden steps when walking Relieving factors: cold packs  PRECAUTIONS: None  RED FLAGS: None   WEIGHT BEARING RESTRICTIONS: No  FALLS:  Has patient fallen in last 6 months? 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  NEXT MD VISIT: Dr Edna on 04/05/2024  OBJECTIVE:  Note: Objective measures were completed at Evaluation unless otherwise noted.  DIAGNOSTIC FINDINGS:   Right knee radiograph on 03/21/2024: IMPRESSION: 1. Medial hemiarthroplasty in expected alignment. No immediate complications.  PATIENT SURVEYS:  LEFS  Extreme difficulty/unable (0), Quite a bit of  difficulty (1), Moderate difficulty (2), Little difficulty (3), No difficulty (4) Survey date:  03/26/24 04/16/2024  Any of your usual work, housework or school activities 0 2  2. Usual hobbies, recreational or sporting activities 0 1  3. Getting into/out of the bath 0 2  4. Walking between rooms 2 2  5. Putting on socks/shoes 1 0  6. Squatting  0  3  7. Lifting an object, like a bag of groceries from the floor 0 2  8. Performing light activities around your home 1 2  9. Performing heavy activities around your home 0 0  10. Getting into/out of a car 0 3  11. Walking 2 blocks 0 3  12. Walking 1 mile 0 3  13. Going up/down 10 stairs (1 flight) 0 3  14. Standing for 1 hour 0 0  15.  sitting for 1 hour 1 3  16. Running on even ground 0 0  17. Running on uneven ground 0 0  18. Making sharp turns while running fast 0 0  19. Hopping  0 3  20. Rolling  over in bed 1 4  Score total:  6/80 = 7.5% 36/80 45%     COGNITION: Overall cognitive status: Within functional limits for tasks assessed     SENSATION: Patient states that the numbness has gone away, but he is still having some itching  EDEMA:  Circumferential: Right- 44.5 cm, Left- 43 cm  MUSCLE LENGTH: Hamstrings: tightness bilat   PALPATION: Minimal tender to palpation around his knee  LOWER EXTREMITY ROM:  Active ROM Right eval Right 04/24/24 Right 05/02/24 Left eval  Knee flexion 90 112 120 107  Knee extension -20 0 0 0   (Blank rows = not tested)  LOWER EXTREMITY MMT:  Eval: Left LE strength:  5-/5 Right hip strength is 4/5 Right knee strength of 3- to 3/5   FUNCTIONAL TESTS:  Eval: 5 times sit to stand: 26.23 sec with poor eccentric muscle control with stand to sit Timed up and go (TUG): 17.77 sec without AD, 12.72 sec with RW  04/18/2024: 6 min walk test:  1,361 ft with RPE of 5/10 and notable antalgic gait towards end of ambulation (age related norms 2,110 ft) 5 times sit to/from stand: 11.698 sec Timed  up and go (TUG): 7.47 sec without AD  GAIT: Distance walked: >50 ft Assistive device utilized: Environmental Consultant - 2 wheeled Level of assistance: Modified independence Comments: Antalgic                                                                                                                                 TREATMENT DATE:   05/14/2024: Recumbent bike level 3 x 6 min with PT present to discuss status Hip flexor stretching/ rocking at stair x 10 with hold bilateral  Standing hamstring stretch at stairs 2x20 sec bilat Squat taps holding 20# kettlebell with staggered stance x10 with each foot leading Seated long arc quad with 7.5# 2x10 bilat Farmer's carry with 20# kettlebell in unilateral hand around both PT gyms x1 lap with weight in each hand Leg Press (seat at 9) 140# 2x10 Single leg press 70# x10 bilat Hip Matrix 45#:  hip abduction, hip extension, hip flexion.  2x10 each bilat   05/09/2024: Patient was late to appointment  Recumbent bike level 3 x 6 min with PT present to discuss status Hip flexor stretching/ rocking at stair x 10 with hold bilateral  Rockerboard (DF/PF) x 2 mins Leg Press (seat at 9) 135# 2x10 Hip Matrix 45#:  hip abduction, hip extension, hip flexion.  2x10 each bilat Forward T at barre 2x10 bilat    05/07/2024: Recumbent bike level 3 x 6 min with PT present to discuss status Standing hamstring stretch at stairs 2x20 sec bilat Calf stretch with rockerboard 2 x 20sec  Hip flexor stretching/ rocking at stair x 10 with hold bilateral  Seated long arc quad with 7.5# 2x10 bilat Staggered sit to stand 20#  x 10 bilateral  Forward T at barre 2x10 bilat Seated  hamstring curl with blue tband 2x10 bilat Lunge to BOSU (forwards & sideways) x10 each no UE support Game ready to right knee x10 min, medium compression, x10 min     PATIENT EDUCATION:  Education details: Issued HEP Person educated: Patient Education method: Explanation, Demonstration, and  Handouts Education comprehension: verbalized understanding  HOME EXERCISE PROGRAM: Access Code: [email or web address] URL: [email or web address] Date: 03/28/2024 Prepared by: Delon Haddock  Exercises - Active Straight Leg Raise with Quad Set  - 1 x daily - 7 x weekly - 2 sets - 10 reps - Supine Heel Slide  - 1 x daily - 7 x weekly - 2 sets - 10 reps - Sit to Stand  - 1 x daily - 7 x weekly - 2 sets - 5 reps - Seated Long Arc Quad  - 1 x daily - 7 x weekly - 2 sets - 10 reps - Seated Heel Slide  - 1 x daily - 7 x weekly - 2 sets - 10 reps - Seated Quad Set  - 1 x daily - 7 x weekly - 2 sets - 10 reps - Standing Hip Abduction with Counter Support  - 1 x daily - 7 x weekly - 2 sets - 10 reps - Heel Raises with Counter Support  - 1 x daily - 7 x weekly - 2 sets - 10 reps - Standing March with Counter Support  - 1 x daily - 7 x weekly - 2 sets - 10 reps - Standing Knee Flexion with Unilateral Counter Support  - 1 x daily - 7 x weekly - 2 sets - 10 reps - Supine Hip Internal and External Rotation  - 1 x daily - 7 x weekly - 1 sets - 10 reps - Supine Hip Abduction  - 1 x daily - 7 x weekly - 2 sets - 10 reps  ASSESSMENT:  CLINICAL IMPRESSION:  Mr Petta presents to PT reporting that his primary complaints is stiffness/tightness.  Patient continues to progress with strengthening and conditioning with knee ROM.  Patient able to progress on leg press weight today and able to start single leg press.  Patient continues to have difficulty with Hip Matrix machine and requires brief seated recovery in between sets.  Patient continues to require skilled PT to progress towards goal related activities.  Will perform reassessment visit next session to ascertain further PT needs.   OBJECTIVE IMPAIRMENTS: Abnormal gait, decreased activity tolerance, decreased balance, difficulty walking, decreased ROM, decreased strength, increased muscle spasms, impaired flexibility, and pain.   ACTIVITY LIMITATIONS:  carrying, lifting, bending, standing, squatting, stairs, bed mobility, and locomotion level  PARTICIPATION LIMITATIONS: cleaning, community activity, and occupation  PERSONAL FACTORS: Time since onset of injury/illness/exacerbation and 3+ comorbidities: OA, s/p L THA, Graves Disease are also affecting patient's functional outcome.   REHAB POTENTIAL: Good  CLINICAL DECISION MAKING: Evolving/moderate complexity  EVALUATION COMPLEXITY: Moderate   GOALS: Goals reviewed with patient? Yes  SHORT TERM GOALS: Target date: 04/16/2024 Patient will be independent with initial HEP.  Baseline: Goal status: MET 03/28/24  2.  Patient to participate in 6 minute walk test to establish a baseline.  Baseline:  Goal status: Met on 04/18/24  3.  Patient will improve right knee A/ROM to 10 to 95 degrees to allow for improved gait pattern. Baseline:  Goal status: MET 04/04/24   LONG TERM GOALS: Target date: 05/18/2024  Patient will be independent with advanced HEP to promote continued exercises post discharge.  Baseline:  Goal status: Ongoing  2.  Patient will increase Lower Extremity Functional Scale to at least 50% to demonstrate increased independence with functional activities.  Baseline: 7.5% Goal status: Ongoing (see above)  3.  Patient will report ability to drive his car without increased pain.  Baseline:  Goal status: Ongoing  4.  Patient will report ability to start a walking/jogging exercise program without increased pain.  Baseline:  Goal status: Ongoing  5.  Patient will increase his strength to [facility] to allow him to navigate stairs with reciprocal pattern.  Baseline:  Goal status: Ongoing  6.  Patient will increase knee A/ROM to 5 to 115 degrees to allow him to demonstrate skills for basketball practices. Baseline:  Goal status: Ongoing (see above)   PLAN:  PT FREQUENCY: 2-3x/week  PT DURATION: 8 weeks  PLANNED INTERVENTIONS: 97164- PT Re-evaluation, 97750- Physical  Performance Testing, 97110-Therapeutic exercises, 97530- Therapeutic activity, 97112- Neuromuscular re-education, 97535- Self Care, 02859- Manual therapy, [phone]- Gait training, [phone]- Canalith repositioning, [id number]- Aquatic Therapy, [phone]- Electrical stimulation (unattended), [phone]- Electrical stimulation (manual), [id number]- Vasopneumatic device, [id number]- Ultrasound, [id number]- Ionotophoresis 4mg /ml Dexamethasone , 79439 (1-2 muscles), 20561 (3+ muscles)- Dry Needling, Patient/Family education, Balance training, Stair training, Taping, Joint mobilization, Joint manipulation, Scar mobilization, Vestibular training, DME instructions, Cryotherapy, and Moist heat  PLAN FOR NEXT SESSION: functional strengthening, balance, activity tolerance, reassessment     Jarrell Laming, PT, DPT 05/14/2024, 10:11 AM  [facility] [address], [address] [place], [place] [address] Phone # [phone] Fax [phone]  "

## 2024-05-15 ENCOUNTER — Other Ambulatory Visit: Payer: Self-pay | Admitting: Nurse Practitioner

## 2024-05-15 DIAGNOSIS — Z720 Tobacco use: Secondary | ICD-10-CM

## 2024-05-16 ENCOUNTER — Ambulatory Visit: Admitting: Rehabilitative and Restorative Service Providers"

## 2024-05-16 ENCOUNTER — Other Ambulatory Visit: Payer: Self-pay

## 2024-05-18 ENCOUNTER — Ambulatory Visit: Attending: Orthopedic Surgery | Admitting: Physical Therapy

## 2024-05-18 ENCOUNTER — Telehealth: Payer: Self-pay | Admitting: Physical Therapy

## 2024-05-18 DIAGNOSIS — R262 Difficulty in walking, not elsewhere classified: Secondary | ICD-10-CM | POA: Insufficient documentation

## 2024-05-18 DIAGNOSIS — M25561 Pain in right knee: Secondary | ICD-10-CM | POA: Insufficient documentation

## 2024-05-18 DIAGNOSIS — M25661 Stiffness of right knee, not elsewhere classified: Secondary | ICD-10-CM | POA: Insufficient documentation

## 2024-05-18 DIAGNOSIS — R252 Cramp and spasm: Secondary | ICD-10-CM | POA: Insufficient documentation

## 2024-05-18 DIAGNOSIS — R600 Localized edema: Secondary | ICD-10-CM | POA: Insufficient documentation

## 2024-05-18 DIAGNOSIS — M6281 Muscle weakness (generalized): Secondary | ICD-10-CM | POA: Insufficient documentation

## 2024-05-18 DIAGNOSIS — R2689 Other abnormalities of gait and mobility: Secondary | ICD-10-CM | POA: Insufficient documentation

## 2024-05-18 MED ORDER — ROSUVASTATIN CALCIUM 20 MG PO TABS: 20.0000 mg | ORAL_TABLET | Freq: Every day | ORAL | 2 refills | Status: AC

## 2024-05-18 NOTE — Telephone Encounter (Signed)
 Left patient a message about missed appointment. Left a call back number to the clinic.   Kristeen Sar, PT, DPT 05/18/2024 10:02 AM

## 2024-05-22 DIAGNOSIS — I10 Essential (primary) hypertension: Secondary | ICD-10-CM

## 2024-05-22 MED ORDER — AMLODIPINE BESYLATE 10 MG PO TABS: 10.0000 mg | ORAL_TABLET | Freq: Every day | ORAL | 1 refills | Status: AC

## 2024-05-22 MED ORDER — HYDRALAZINE HCL 25 MG PO TABS: ORAL_TABLET | ORAL | 1 refills | Status: AC

## 2024-05-22 MED ORDER — LISINOPRIL-HYDROCHLOROTHIAZIDE 20-25 MG PO TABS: 1.0000 | ORAL_TABLET | Freq: Every day | ORAL | 1 refills | Status: AC

## 2024-05-28 ENCOUNTER — Telehealth: Payer: Self-pay | Admitting: Behavioral Health

## 2024-05-28 ENCOUNTER — Other Ambulatory Visit: Payer: Self-pay

## 2024-05-28 DIAGNOSIS — F900 Attention-deficit hyperactivity disorder, predominantly inattentive type: Secondary | ICD-10-CM

## 2024-05-28 NOTE — Telephone Encounter (Signed)
 Wife lvm that Rylyn needs a refill on his adderall 15mg . Pharmacy is [facility] on [address]

## 2024-05-28 NOTE — Telephone Encounter (Signed)
 Please call to schedule FU, was due in Dec but provider canceled.

## 2024-05-28 NOTE — Telephone Encounter (Signed)
 Pt is scheduled for 05/29/2024

## 2024-05-29 ENCOUNTER — Ambulatory Visit: Admitting: Behavioral Health

## 2024-05-29 MED ORDER — AMPHETAMINE-DEXTROAMPHETAMINE 15 MG PO TABS: 15.0000 mg | ORAL_TABLET | Freq: Three times a day (TID) | ORAL | 0 refills | Status: DC

## 2024-05-29 NOTE — Telephone Encounter (Signed)
 Pended

## 2024-06-12 ENCOUNTER — Ambulatory Visit
Admission: RE | Admit: 2024-06-12 | Discharge: 2024-06-12 | Disposition: A | Source: Ambulatory Visit | Attending: Nurse Practitioner | Admitting: Nurse Practitioner

## 2024-06-12 DIAGNOSIS — Z720 Tobacco use: Secondary | ICD-10-CM

## 2024-06-13 ENCOUNTER — Encounter: Payer: Self-pay | Admitting: Behavioral Health

## 2024-06-13 ENCOUNTER — Ambulatory Visit: Admitting: Behavioral Health

## 2024-06-13 DIAGNOSIS — F411 Generalized anxiety disorder: Secondary | ICD-10-CM | POA: Diagnosis not present

## 2024-06-13 DIAGNOSIS — F3341 Major depressive disorder, recurrent, in partial remission: Secondary | ICD-10-CM | POA: Diagnosis not present

## 2024-06-13 DIAGNOSIS — F900 Attention-deficit hyperactivity disorder, predominantly inattentive type: Secondary | ICD-10-CM | POA: Diagnosis not present

## 2024-06-13 MED ORDER — AMPHETAMINE-DEXTROAMPHETAMINE 15 MG PO TABS: 15.0000 mg | ORAL_TABLET | Freq: Three times a day (TID) | ORAL | 0 refills | Status: AC

## 2024-06-13 MED ORDER — ESCITALOPRAM OXALATE 20 MG PO TABS: 20.0000 mg | ORAL_TABLET | Freq: Every day | ORAL | 1 refills | Status: AC

## 2024-06-13 NOTE — Progress Notes (Deleted)
"       [facility]  Patient ID: Steven Gibson,  MRN: [id number]  PCP: Georgina Speaks, FNP  Date of Evaluation: 06/13/2024 Time spent:30 minutes  Chief Complaint:   HISTORY/CURRENT STATUS: HPI  Individual Medical History/ Review of Systems: Changes? :No   Allergies: Patient has no known allergies.  Current Medications: Current Medications[1] Medication Side Effects: none  Family Medical/ Social History: Changes? No  MENTAL HEALTH EXAM:  There were no vitals taken for this visit.There is no height or weight on file to calculate BMI.  General Appearance: Casual, Neat, and Well Groomed  Eye Contact:  {PSY:22684}  Speech:  {PSY:[phone]}  Volume:  {PSY:22686}  Mood:  {PSY:22306}  Affect:  {[phone]}  Thought Process:  {PSY:22688}  Orientation:  {PSY:22689}  Thought Content: {PSYt:22690}   Suicidal Thoughts:  {PSY:22692}  Homicidal Thoughts:  {PSY:22692}  Memory:  {[phone]}  Judgement:  {PSY:22694}  Insight:  {PSY:22695}  Psychomotor Activity:  {PSY:22696}  Concentration:  {PSY:21399}  Recall:  {PSY:22877}  Fund of Knowledge: {PSY:22877}  Language: {EDB:77122}  Assets:  {PSY:22698}  ADL's:  {PSY:22290}  Cognition: {PSY:304700322}  Prognosis:  {PSY:22877}    DIAGNOSES: No diagnosis found.  Receiving Psychotherapy: {EDB:78802}   RECOMMENDATIONS: ***   Redell DELENA Pizza, NP     [1]  Current Outpatient Medications:    amLODipine  (NORVASC ) 10 MG tablet, Take 1 tablet (10 mg total) by mouth daily., Disp: 90 tablet, Rfl: 1   amphetamine -dextroamphetamine  (ADDERALL) 15 MG tablet, Take 1 tablet by mouth 3 (three) times daily., Disp: 90 tablet, Rfl: 0   Azelastine  HCl 137 MCG/SPRAY SOLN, Place 2 sprays into the nose daily., Disp: 30 mL, Rfl: 2   escitalopram  (LEXAPRO ) 20 MG tablet, Take 1 tablet (20 mg total) by mouth daily., Disp: 90 tablet, Rfl: 1   hydrALAZINE  (APRESOLINE ) 25 MG tablet, TAKE 1 TABLET TWICE DAILY, AM & NIGHT, Disp: 180 tablet,  Rfl: 1   lisinopril -hydrochlorothiazide  (ZESTORETIC ) 20-25 MG tablet, Take 1 tablet by mouth daily., Disp: 90 tablet, Rfl: 1   methimazole  (TAPAZOLE ) 5 MG tablet, Take 1 tablet (5 mg total) by mouth as directed. 1 tablet Monday through Saturday and none on Sundays, Disp: 78 tablet, Rfl: 3   omeprazole  (PRILOSEC) 40 MG capsule, Take 1 capsule (40 mg total) by mouth daily for 21 days., Disp: 21 capsule, Rfl: 0   polyethylene glycol (MIRALAX ) 17 g packet, Take 17 g by mouth daily., Disp: 14 each, Rfl: 0   Potassium Chloride  (K+ POTASSIUM PO), Take 99 mg by mouth daily. Over the counter, per pt takes for leg cramps, Disp: , Rfl:    rosuvastatin  (CRESTOR ) 20 MG tablet, Take 1 tablet (20 mg total) by mouth daily., Disp: 90 tablet, Rfl: 2  "

## 2024-06-13 NOTE — Progress Notes (Signed)
 "     [facility]  Patient ID: Steven Gibson,  MRN: [id number]  PCP: Georgina Speaks, FNP  Date of Evaluation: 06/13/2024 Time spent:30 minutes  Chief Complaint:  Chief Complaint   Depression; Anxiety; ADHD; Follow-up; Medication Refill; Patient Education     HISTORY/CURRENT STATUS: HPI Steven Gibson, 54 year old African-American male presents to this office for follow up and medication management. No social changes this visit. He is very happy with his care. Says that his medications are working fine for now and that he would only need refills.  He says he is currently stable and not requesting any changes to his medication regimen. He reports anxiety at 2/10, and depression at 2/10.    He is sleeping 7 to 8 hours per night.  He denies any history of mania or psychosis.  No history of auditory or visual hallucinations.  No SI or HI.   Past psychiatric medication trials: Wellbutrin Vyvanse Individual Medical History/ Review of Systems: Changes? :No   Allergies: Patient has no known allergies.  Current Medications: Current Medications[1] Medication Side Effects: none  Family Medical/ Social History: Changes? No  MENTAL HEALTH EXAM:  There were no vitals taken for this visit.There is no height or weight on file to calculate BMI.  General Appearance: Casual  Eye Contact:  Good  Speech:  Clear and Coherent  Volume:  Normal  Mood:  NA  Affect:  Appropriate  Thought Process:  Coherent  Orientation:  Full (Time, Place, and Person)  Thought Content: Logical   Suicidal Thoughts:  No  Homicidal Thoughts:  No  Memory:  WNL  Judgement:  Good  Insight:  Good  Psychomotor Activity:  Normal  Concentration:  Concentration: Good  Recall:  Good  Fund of Knowledge: Good  Language: Good  Assets:  Desire for Improvement  ADL's:  Intact  Cognition: WNL  Prognosis:  Good    DIAGNOSES:    ICD-10-CM   1. Attention deficit hyperactivity disorder (ADHD), predominantly inattentive  type  F90.0 escitalopram  (LEXAPRO ) 20 MG tablet    amphetamine -dextroamphetamine  (ADDERALL) 15 MG tablet    2. Generalized anxiety disorder  F41.1     3. Recurrent major depressive disorder, in partial remission  F33.41       Receiving Psychotherapy: No    RECOMMENDATIONS:   Greater than 50% of face to face time with patient was spent on counseling and coordination of care.  No changes this visit. He continues to enjoy very good stability.  No changes or adjustments to medication indicated at this time. Wife assist in organizing and requesting refills for him. No concerns this visit.  Reinforced importance of monitoring BP regularly.    We agreed to:  Will continue Adderall 15 mg three time daily as needed To continue Lexapro  20 mg daily To report side effects or worsening symtoms To follow up in 6 months  to reassess Provided emergency contact information Reviewed PDMP  Redell DELENA Pizza, NP     [1]  Current Outpatient Medications:    amLODipine  (NORVASC ) 10 MG tablet, Take 1 tablet (10 mg total) by mouth daily., Disp: 90 tablet, Rfl: 1   [START ON 06/28/2024] amphetamine -dextroamphetamine  (ADDERALL) 15 MG tablet, Take 1 tablet by mouth 3 (three) times daily., Disp: 90 tablet, Rfl: 0   Azelastine  HCl 137 MCG/SPRAY SOLN, Place 2 sprays into the nose daily., Disp: 30 mL, Rfl: 2   escitalopram  (LEXAPRO ) 20 MG tablet, Take 1 tablet (20 mg total) by mouth daily., Disp: 90 tablet,  Rfl: 1   hydrALAZINE  (APRESOLINE ) 25 MG tablet, TAKE 1 TABLET TWICE DAILY, AM & NIGHT, Disp: 180 tablet, Rfl: 1   lisinopril -hydrochlorothiazide  (ZESTORETIC ) 20-25 MG tablet, Take 1 tablet by mouth daily., Disp: 90 tablet, Rfl: 1   methimazole  (TAPAZOLE ) 5 MG tablet, Take 1 tablet (5 mg total) by mouth as directed. 1 tablet Monday through Saturday and none on Sundays, Disp: 78 tablet, Rfl: 3   omeprazole  (PRILOSEC) 40 MG capsule, Take 1 capsule (40 mg total) by mouth daily for 21 days., Disp: 21 capsule, Rfl: 0    Potassium Chloride  (K+ POTASSIUM PO), Take 99 mg by mouth daily. Over the counter, per pt takes for leg cramps, Disp: , Rfl:    rosuvastatin  (CRESTOR ) 20 MG tablet, Take 1 tablet (20 mg total) by mouth daily., Disp: 90 tablet, Rfl: 2  "

## 2024-06-18 ENCOUNTER — Ambulatory Visit: Payer: Self-pay | Admitting: Nurse Practitioner

## 2024-06-21 ENCOUNTER — Encounter: Payer: Self-pay | Admitting: Nurse Practitioner

## 2024-08-07 ENCOUNTER — Ambulatory Visit: Admitting: Nurse Practitioner

## 2024-12-11 ENCOUNTER — Ambulatory Visit: Admitting: Behavioral Health

## 2025-04-16 ENCOUNTER — Encounter: Payer: Self-pay | Admitting: Nurse Practitioner
